# Patient Record
Sex: Male | Born: 1960 | Race: White | Hispanic: No | Marital: Married | Smoking: Never smoker
Health system: Southern US, Community
[De-identification: ages and names within clinical notes are randomized; demographics above are authoritative.]

## PROBLEM LIST (undated history)

## (undated) DIAGNOSIS — G4730 Sleep apnea, unspecified: Secondary | ICD-10-CM

## (undated) DIAGNOSIS — I10 Essential (primary) hypertension: Secondary | ICD-10-CM

## (undated) DIAGNOSIS — M1990 Unspecified osteoarthritis, unspecified site: Secondary | ICD-10-CM

## (undated) DIAGNOSIS — J45909 Unspecified asthma, uncomplicated: Secondary | ICD-10-CM

## (undated) DIAGNOSIS — J302 Other seasonal allergic rhinitis: Secondary | ICD-10-CM

## (undated) DIAGNOSIS — E785 Hyperlipidemia, unspecified: Secondary | ICD-10-CM

## (undated) DIAGNOSIS — Z974 Presence of external hearing-aid: Secondary | ICD-10-CM

## (undated) DIAGNOSIS — E119 Type 2 diabetes mellitus without complications: Secondary | ICD-10-CM

## (undated) DIAGNOSIS — G589 Mononeuropathy, unspecified: Secondary | ICD-10-CM

## (undated) DIAGNOSIS — Z973 Presence of spectacles and contact lenses: Secondary | ICD-10-CM

## (undated) DIAGNOSIS — I8390 Asymptomatic varicose veins of unspecified lower extremity: Secondary | ICD-10-CM

## (undated) DIAGNOSIS — D649 Anemia, unspecified: Secondary | ICD-10-CM

## (undated) DIAGNOSIS — K219 Gastro-esophageal reflux disease without esophagitis: Secondary | ICD-10-CM

## (undated) DIAGNOSIS — G4733 Obstructive sleep apnea (adult) (pediatric): Secondary | ICD-10-CM

## (undated) DIAGNOSIS — K760 Fatty (change of) liver, not elsewhere classified: Secondary | ICD-10-CM

## (undated) DIAGNOSIS — Z87442 Personal history of urinary calculi: Secondary | ICD-10-CM

## (undated) DIAGNOSIS — R42 Dizziness and giddiness: Secondary | ICD-10-CM

## (undated) HISTORY — PX: TONSILLECTOMY AND ADENOIDECTOMY: SUR1326

## (undated) HISTORY — PX: COLONOSCOPY: SHX174

## (undated) HISTORY — DX: Hyperlipidemia, unspecified: E78.5

## (undated) HISTORY — DX: Essential (primary) hypertension: I10

## (undated) HISTORY — PX: KNEE ARTHROSCOPY: SUR90

## (undated) HISTORY — PX: NASAL SINUS SURGERY: SHX719

## (undated) HISTORY — PX: TRIGGER FINGER RELEASE: SHX641

## (undated) HISTORY — DX: Unspecified asthma, uncomplicated: J45.909

## (undated) HISTORY — PX: FRACTURE SURGERY: SHX138

## (undated) HISTORY — PX: SHOULDER ARTHROSCOPY: SHX128

## (undated) HISTORY — DX: Sleep apnea, unspecified: G47.30

## (undated) HISTORY — PX: CHOLECYSTECTOMY: SHX55

---

## 2013-02-17 ENCOUNTER — Other Ambulatory Visit: Payer: Self-pay | Admitting: *Deleted

## 2013-02-23 ENCOUNTER — Other Ambulatory Visit: Payer: Self-pay | Admitting: *Deleted

## 2013-02-23 ENCOUNTER — Other Ambulatory Visit: Payer: BC Managed Care – PPO

## 2013-02-23 ENCOUNTER — Other Ambulatory Visit: Payer: Self-pay

## 2013-02-23 DIAGNOSIS — IMO0001 Reserved for inherently not codable concepts without codable children: Secondary | ICD-10-CM

## 2013-02-23 LAB — LIPID PANEL
HDL: 38.1 mg/dL — ABNORMAL LOW (ref >39.00)
LDL Cholesterol: 54 mg/dL (ref 0–99)
Total CHOL/HDL Ratio: 3
Triglycerides: 167 mg/dL — ABNORMAL HIGH (ref 0.0–149.0)

## 2013-02-23 LAB — COMPREHENSIVE METABOLIC PANEL
ALT: 41 U/L (ref 0–53)
AST: 37 U/L (ref 0–37)
Albumin: 3.9 g/dL (ref 3.5–5.2)
Alkaline Phosphatase: 117 U/L (ref 39–117)
BUN: 13 mg/dL (ref 6–23)
Creatinine, Ser: 0.8 mg/dL (ref 0.4–1.5)
Potassium: 3.7 mEq/L (ref 3.5–5.1)

## 2013-02-23 LAB — HEMOGLOBIN A1C: Hgb A1c MFr Bld: 8.9 % — ABNORMAL HIGH (ref 4.6–6.5)

## 2013-02-23 LAB — URINALYSIS
Bilirubin Urine: NEGATIVE
Specific Gravity, Urine: >=1.03 (ref 1.000–1.030)
Total Protein, Urine: NEGATIVE
Urine Glucose: 500
pH: 5.5 (ref 5.0–8.0)

## 2013-03-03 ENCOUNTER — Encounter: Payer: Self-pay | Admitting: Endocrinology

## 2013-03-03 ENCOUNTER — Ambulatory Visit (INDEPENDENT_AMBULATORY_CARE_PROVIDER_SITE_OTHER): Payer: BC Managed Care – PPO | Admitting: Endocrinology

## 2013-03-03 VITALS — BP 118/62 | HR 88 | Temp 98.7°F | Resp 14 | Ht 73.0 in | Wt 333.9 lb

## 2013-03-03 DIAGNOSIS — E785 Hyperlipidemia, unspecified: Secondary | ICD-10-CM

## 2013-03-03 DIAGNOSIS — E669 Obesity, unspecified: Secondary | ICD-10-CM

## 2013-03-03 DIAGNOSIS — E119 Type 2 diabetes mellitus without complications: Secondary | ICD-10-CM | POA: Insufficient documentation

## 2013-03-03 DIAGNOSIS — I10 Essential (primary) hypertension: Secondary | ICD-10-CM | POA: Insufficient documentation

## 2013-03-03 NOTE — Progress Notes (Signed)
Patient ID: Darryl Hunt, male   DOB: [birth year], 52 y.o.   MRN: [id number]  Darryl Hunt is an 52 y.o. male.   Reason for Appointment: Diabetes follow-up   History of Present Illness   Diagnosis: Type 2 DIABETES MELITUS, date of diagnosis: 2010          His diabetes is usually under  Fairly good control with regimen of Victoza, metformin and  WelChol  However because of having problems with the shoulder and needing surgery he has completely  ignoredhis diabetes and blood sugars are poorly controlled   He has not been watching his diet and has been irregular with some medications Has not monitored his blood sugar  For nearly 3 months    Side effects from medications: None Monitors blood glucose: Once a day.    Glucometer: One Touch.          Blood Glucose readings  Not available from home , lab glucose 167    Hypoglycemia frequency: Never.          Meals: 3 meals per day.  watching diet poorly     Physical activity: exercise:  None recently            continues to work right shift  Wt Readings from Last 3 Encounters:  03/03/13 333 lb 14.4 oz (151.456 kg)    No visits with results within 1 Week(s) from this visit. Latest known visit with results is:  Appointment on 02/23/2013  Component Date Value Range Status  . Microalb, Ur 02/23/2013 4.3* 0.0 - 1.9 mg/dL Final  . Creatinine,U 82/95/6213 204.0   Final  . Microalb Creat Ratio 02/23/2013 2.1  0.0 - 30.0 mg/g Final  . Color, Urine 02/23/2013 LT. YELLOW  Yellow;Lt. Yellow Final  . APPearance 02/23/2013 CLEAR  Clear Final  . Specific Gravity, Urine 02/23/2013 >=1.030  1.000 - 1.030 Final  . pH 02/23/2013 5.5  5.0 - 8.0 Final  . Total Protein, Urine 02/23/2013 NEGATIVE  Negative Final  . Urine Glucose 02/23/2013 500  Negative Final  . Ketones, ur 02/23/2013 TRACE  Negative Final  . Bilirubin Urine 02/23/2013 NEGATIVE  Negative Final  . Hgb urine dipstick 02/23/2013 NEGATIVE  Negative Final  . Urobilinogen, UA 02/23/2013  0.2  0.0 - 1.0 Final  . Leukocytes, UA 02/23/2013 NEGATIVE  Negative Final  . Nitrite 02/23/2013 NEGATIVE  Negative Final  . Hemoglobin A1C 02/23/2013 8.9* 4.6 - 6.5 % Final   Glycemic Control Guidelines for People with Diabetes:Non Diabetic:  <6%Goal of Therapy: <7%Additional Action Suggested:  >8%   . Sodium 02/23/2013 134* 135 - 145 mEq/L Final  . Potassium 02/23/2013 3.7  3.5 - 5.1 mEq/L Final  . Chloride 02/23/2013 100  96 - 112 mEq/L Final  . CO2 02/23/2013 24  19 - 32 mEq/L Final  . Glucose, Bld 02/23/2013 167* 70 - 99 mg/dL Final  . BUN 08/65/7846 13  6 - 23 mg/dL Final  . Creatinine, Ser 02/23/2013 0.8  0.4 - 1.5 mg/dL Final  . Total Bilirubin 02/23/2013 1.7* 0.3 - 1.2 mg/dL Final  . Alkaline Phosphatase 02/23/2013 117  39 - 117 U/L Final  . AST 02/23/2013 37  0 - 37 U/L Final  . ALT 02/23/2013 41  0 - 53 U/L Final  . Total Protein 02/23/2013 7.2  6.0 - 8.3 g/dL Final  . Albumin 96/29/5284 3.9  3.5 - 5.2 g/dL Final  . Calcium 13/24/4010 9.3  8.4 - 10.5 mg/dL Final  . GFR  02/23/2013 112.08  >60.00 mL/min Final  . Cholesterol 02/23/2013 125  0 - 200 mg/dL Final   ATP III Classification       Desirable:  < 200 mg/dL               Borderline High:  200 - 239 mg/dL          High:  > = 161 mg/dL  . Triglycerides 02/23/2013 167.0* 0.0 - 149.0 mg/dL Final   Normal:  <096 mg/dLBorderline High:  150 - 199 mg/dL  . HDL 02/23/2013 38.10* >39.00 mg/dL Final  . VLDL 04/54/0981 33.4  0.0 - 40.0 mg/dL Final  . LDL Cholesterol 02/23/2013 54  0 - 99 mg/dL Final  . Total CHOL/HDL Ratio 02/23/2013 3   Final                  Men          Women1/2 Average Risk     3.4          3.3Average Risk          5.0          4.42X Average Risk          9.6          7.13X Average Risk          15.0          11.0                          Medication List       This list is accurate as of: 03/03/13  4:51 PM.  Always use your most recent med list.               B-D ULTRAFINE III SHORT PEN 31G X 8 MM Misc   Generic drug:  Insulin Pen Needle     fish oil-omega-3 fatty acids 1000 MG capsule  Take 2 g by mouth daily. 2 tablets twice a day     lisinopril 20 MG tablet  Commonly known as:  PRINIVIL,ZESTRIL  20 mg.     loratadine 10 MG tablet  Commonly known as:  CLARITIN  Take 10 mg by mouth daily.     meloxicam 15 MG tablet  Commonly known as:  MOBIC  15 mg.     metFORMIN 1000 MG tablet  Commonly known as:  GLUCOPHAGE  Take 1,000 mg by mouth 2 (two) times daily.     methocarbamol 500 MG tablet  Commonly known as:  ROBAXIN     montelukast 10 MG tablet  Commonly known as:  SINGULAIR     omeprazole 20 MG capsule  Commonly known as:  PRILOSEC  Take 20 mg by mouth daily.     simvastatin 40 MG tablet  Commonly known as:  ZOCOR     VICTOZA 18 MG/3ML Sopn  Generic drug:  Liraglutide  1.8 mg.     WELCHOL 625 MG tablet  Generic drug:  colesevelam        Allergies: No Known Allergies  No past medical history on file.  No past surgical history on file.  No family history on file.  Social History:  reports that he has never smoked. He has never used smokeless tobacco. His alcohol and drug histories are not on file.  Review of Systems:  HYPERTENSION:   Is well-controlled with lisinopril  HYPERLIPIDEMIA: The lipid abnormality consists of elevated LDL which is now 54,  taking simvastatin  With WelChol.     Examination:   BP 118/62  Pulse 88  Temp(Src) 98.7 F (37.1 C)  Resp 14  Ht 6\' 1"  (1.854 m)  Wt 333 lb 14.4 oz (151.456 kg)  BMI 44.06 kg/m2  SpO2 97%  Body mass index is 44.06 kg/(m^2).   ASSESSMENT/ PLAN::   Diabetes type 2   The patient's diabetes control appears to be  Much worse because of noncompliance with diet, exercise and some medications  he also has difficulty losing weight.  Since his blood sugars have not been consistently optimally controlled will add Invokana. Discussed how this works, effects on glucose and blood pressure, dosage, side  effects  He will start with the sample and used 300 mg daily by prescription He will monitor his blood sugars again as well as discussed blood sugar targets, need for exercise, balanced low-fat meals and regular followup   He'll continue his Zocor  And WelChol  He'll reduce his lisinopril to half a tablet to avoid hypotension with Invokana since blood pressure is already low normal  Counseling time over 50% of today's 25 minute visit  Bryn Perkin 03/03/2013, 4:51 PM

## 2013-03-03 NOTE — Patient Instructions (Addendum)
Invokana 300 mg daily in ams  Lisinporil 1/2 tabs  Please check blood sugars at least half the time about 2 hours after any meal and as directed on waking up. Please bring blood sugar monitor to each visit

## 2013-03-08 DIAGNOSIS — E785 Hyperlipidemia, unspecified: Secondary | ICD-10-CM | POA: Insufficient documentation

## 2013-03-08 DIAGNOSIS — E669 Obesity, unspecified: Secondary | ICD-10-CM | POA: Insufficient documentation

## 2013-03-08 MED ORDER — CANAGLIFLOZIN 300 MG PO TABS: 300.0000 mg | ORAL_TABLET | Freq: Every day | ORAL | Status: DC

## 2013-03-10 ENCOUNTER — Other Ambulatory Visit: Payer: Self-pay | Admitting: *Deleted

## 2013-04-02 ENCOUNTER — Other Ambulatory Visit (INDEPENDENT_AMBULATORY_CARE_PROVIDER_SITE_OTHER): Payer: BC Managed Care – PPO | Admitting: *Deleted

## 2013-04-02 ENCOUNTER — Encounter: Payer: Self-pay | Admitting: Endocrinology

## 2013-04-02 ENCOUNTER — Ambulatory Visit (INDEPENDENT_AMBULATORY_CARE_PROVIDER_SITE_OTHER): Payer: BC Managed Care – PPO | Admitting: Endocrinology

## 2013-04-02 ENCOUNTER — Other Ambulatory Visit: Payer: Self-pay | Admitting: *Deleted

## 2013-04-02 VITALS — BP 114/60 | HR 76 | Temp 98.5°F | Resp 14 | Ht 73.0 in | Wt 325.0 lb

## 2013-04-02 DIAGNOSIS — Z23 Encounter for immunization: Secondary | ICD-10-CM

## 2013-04-02 DIAGNOSIS — I10 Essential (primary) hypertension: Secondary | ICD-10-CM

## 2013-04-02 MED ORDER — LISINOPRIL 10 MG PO TABS: 10.0000 mg | ORAL_TABLET | Freq: Every day | ORAL | Status: DC

## 2013-04-02 NOTE — Progress Notes (Signed)
Patient ID: Darryl Hunt, male   DOB: 1960-11-06, 52 y.o.   MRN: [id number]  Darryl Hunt is an 52 y.o. male.   Reason for Appointment: Diabetes follow-up   History of Present Illness   Diagnosis: Type 2 DIABETES MELITUS, date of diagnosis: 2010        PREVIOUS history: He has been treated previously with regimen of metformin, glipizide and subsequently Victoza Overall has had fairly good control and glipizide was stopped because of tendency to hypoglycemia His A1c in the past has ranged from 6.7-7%, was 7% in 4/14  RECENT history: He is now on a regimen of Victoza, metformin and  WelChol   The blood sugars were poorly controlled on his last visit and he was quite noncompliant with diet and some medications also Had also not been aware of his high sugars because he stopped monitoring He does work night shifts   Because of his difficulty losing weight even with Victoza he was given a trial of Invokana starting at 100 mg and then 300 mg per He has lost weight and recent blood sugars appear to be better He has had balanitis and frequent urination. Currently having control of his balanitis with OTC antifungal creams  Side effects from medications:  balanitis from Invokana Monitors blood glucose: Once a day.    Glucometer: One Touch.          Blood Glucose readings: Recently at work 117-149, evening 117-187 with sporadic readings. Highest reading 259 after supper about 2 weeks ago and overall median 143   Hypoglycemia frequency: Never.          Meals: 3 meals per day.  watching diet better recently  Physical activity: exercise: walking 30 minutes qod            Wt Readings from Last 3 Encounters:  04/02/13 325 lb (147.419 kg)  03/03/13 333 lb 14.4 oz (151.456 kg)    No visits with results within 1 Week(s) from this visit. Latest known visit with results is:  Appointment on 02/23/2013  Component Date Value Range Status  . Microalb, Ur 02/23/2013 4.3* 0.0 - 1.9 mg/dL Final   . Creatinine,U 02/23/2013 204.0   Final  . Microalb Creat Ratio 02/23/2013 2.1  0.0 - 30.0 mg/g Final  . Color, Urine 02/23/2013 LT. YELLOW  Yellow;Lt. Yellow Final  . APPearance 02/23/2013 CLEAR  Clear Final  . Specific Gravity, Urine 02/23/2013 >=1.030  1.000 - 1.030 Final  . pH 02/23/2013 5.5  5.0 - 8.0 Final  . Total Protein, Urine 02/23/2013 NEGATIVE  Negative Final  . Urine Glucose 02/23/2013 500  Negative Final  . Ketones, ur 02/23/2013 TRACE  Negative Final  . Bilirubin Urine 02/23/2013 NEGATIVE  Negative Final  . Hgb urine dipstick 02/23/2013 NEGATIVE  Negative Final  . Urobilinogen, UA 02/23/2013 0.2  0.0 - 1.0 Final  . Leukocytes, UA 02/23/2013 NEGATIVE  Negative Final  . Nitrite 02/23/2013 NEGATIVE  Negative Final  . Hemoglobin A1C 02/23/2013 8.9* 4.6 - 6.5 % Final   Glycemic Control Guidelines for People with Diabetes:Non Diabetic:  <6%Goal of Therapy: <7%Additional Action Suggested:  >8%   . Sodium 02/23/2013 134* 135 - 145 mEq/L Final  . Potassium 02/23/2013 3.7  3.5 - 5.1 mEq/L Final  . Chloride 02/23/2013 100  96 - 112 mEq/L Final  . CO2 02/23/2013 24  19 - 32 mEq/L Final  . Glucose, Bld 02/23/2013 167* 70 - 99 mg/dL Final  . BUN 40/98/1191 13  6 -  23 mg/dL Final  . Creatinine, Ser 02/23/2013 0.8  0.4 - 1.5 mg/dL Final  . Total Bilirubin 02/23/2013 1.7* 0.3 - 1.2 mg/dL Final  . Alkaline Phosphatase 02/23/2013 117  39 - 117 U/L Final  . AST 02/23/2013 37  0 - 37 U/L Final  . ALT 02/23/2013 41  0 - 53 U/L Final  . Total Protein 02/23/2013 7.2  6.0 - 8.3 g/dL Final  . Albumin 16/04/9603 3.9  3.5 - 5.2 g/dL Final  . Calcium 54/03/8118 9.3  8.4 - 10.5 mg/dL Final  . GFR 14/78/2956 112.08  >60.00 mL/min Final  . Cholesterol 02/23/2013 125  0 - 200 mg/dL Final   ATP III Classification       Desirable:  < 200 mg/dL               Borderline High:  200 - 239 mg/dL          High:  > = 213 mg/dL  . Triglycerides 02/23/2013 167.0* 0.0 - 149.0 mg/dL Final   Normal:  <086  mg/dLBorderline High:  150 - 199 mg/dL  . HDL 02/23/2013 38.10* >39.00 mg/dL Final  . VLDL 57/84/6962 33.4  0.0 - 40.0 mg/dL Final  . LDL Cholesterol 02/23/2013 54  0 - 99 mg/dL Final  . Total CHOL/HDL Ratio 02/23/2013 3   Final                  Men          Women1/2 Average Risk     3.4          3.3Average Risk          5.0          4.42X Average Risk          9.6          7.13X Average Risk          15.0          11.0                          Medication List       This list is accurate as of: 04/02/13 11:59 PM.  Always use your most recent med list.               B-D ULTRAFINE III SHORT PEN 31G X 8 MM Misc  Generic drug:  Insulin Pen Needle     Canagliflozin 300 MG Tabs  Commonly known as:  INVOKANA  Take 1 tablet (300 mg total) by mouth daily at 6 (six) AM.     fish oil-omega-3 fatty acids 1000 MG capsule  Take 2 g by mouth daily. 2 tablets twice a day     lisinopril 10 MG tablet  Commonly known as:  PRINIVIL,ZESTRIL  Take 1 tablet (10 mg total) by mouth daily.     loratadine 10 MG tablet  Commonly known as:  CLARITIN  Take 10 mg by mouth daily.     meloxicam 15 MG tablet  Commonly known as:  MOBIC  15 mg.     metFORMIN 1000 MG tablet  Commonly known as:  GLUCOPHAGE  Take 1,000 mg by mouth 2 (two) times daily.     methocarbamol 500 MG tablet  Commonly known as:  ROBAXIN     montelukast 10 MG tablet  Commonly known as:  SINGULAIR     omeprazole 20 MG capsule  Commonly known as:  PRILOSEC  Take 20 mg by mouth daily.     senna-docusate 8.6-50 MG per tablet  Commonly known as:  Senokot-S  Take 1 tablet by mouth daily.     simvastatin 40 MG tablet  Commonly known as:  ZOCOR     VICTOZA 18 MG/3ML Sopn  Generic drug:  Liraglutide  1.8 mg.     WELCHOL 625 MG tablet  Generic drug:  colesevelam        Allergies: No Known Allergies  Past Medical History  Diagnosis Date  . Sleep apnea   . Asthma   . Hypertension   . Hyperlipidemia     No past  surgical history on file.  Family History  Problem Relation Age of Onset  . Cancer Mother   . Cancer Father   . Hypertension Brother     Social History:  reports that he has never smoked. He has never used smokeless tobacco. His alcohol and drug histories are not on file.  Review of Systems:  HYPERTENSION:   Is well-controlled with lisinopril, did not reduce this to half a tablet as directed on the last visit  HYPERLIPIDEMIA: The lipid abnormality consists of elevated LDL which is now 54, taking simvastatin  With WelChol.     Examination:   BP 114/60  Pulse 76  Temp(Src) 98.5 F (36.9 C)  Resp 14  Ht 6\' 1"  (1.854 m)  Wt 325 lb (147.419 kg)  BMI 42.89 kg/m2  SpO2 97%  Body mass index is 42.89 kg/(m^2).   ASSESSMENT/ PLAN::   Diabetes type 2    Because he had difficulty losing weight with Victoza was continued and Invokana started on the last visit about a month ago  The patient's diabetes control appears to be  improving  and he has lost significant amount of weight He is taking this on waking up and is regular with this along with other medications He has also improved his compliance with diet, exercise and taking all his medications He did have balanitis and some frequent urination with starting Invokana and this is improved Encouraged him to check his blood sugars at various times  He will reduce his lisinopril to 10 mg a day since blood pressure is low normal  Will do A1c in 2 months  Coner Gibbard 04/03/2013, 2:00 PM

## 2013-04-02 NOTE — Patient Instructions (Addendum)
Please check blood sugars at least half the time about 2 hours after any meal and as directed on waking up. Please bring blood sugar monitor to each visit  Lisinopril 1/2 daily

## 2013-04-03 ENCOUNTER — Encounter: Payer: Self-pay | Admitting: Endocrinology

## 2013-04-21 ENCOUNTER — Telehealth: Payer: Self-pay | Admitting: Endocrinology

## 2013-04-21 NOTE — Telephone Encounter (Signed)
Pt is aware.  

## 2013-04-21 NOTE — Telephone Encounter (Signed)
Not from medication, likely to be related to the bones or discs in the spine

## 2013-04-21 NOTE — Telephone Encounter (Signed)
Pt says he is having a lot of pain in his back on his right side.  He says he's been on Invokana for about two months and symptoms started shortly after that.  He says he went to his NP who said she didn't think it was from the medicine, but could be arthritis, please advise.

## 2013-05-29 ENCOUNTER — Other Ambulatory Visit (INDEPENDENT_AMBULATORY_CARE_PROVIDER_SITE_OTHER): Payer: BC Managed Care – PPO

## 2013-05-29 LAB — COMPREHENSIVE METABOLIC PANEL
ALT: 40 U/L (ref 0–53)
Alkaline Phosphatase: 106 U/L (ref 39–117)
CO2: 24 mEq/L (ref 19–32)
Calcium: 9 mg/dL (ref 8.4–10.5)
Creatinine, Ser: 0.7 mg/dL (ref 0.4–1.5)
GFR: 129.24 mL/min (ref >60.00)
Glucose, Bld: 96 mg/dL (ref 70–99)
Potassium: 3.9 mEq/L (ref 3.5–5.1)
Sodium: 136 mEq/L (ref 135–145)
Total Bilirubin: 1.6 mg/dL — ABNORMAL HIGH (ref 0.3–1.2)
Total Protein: 7.3 g/dL (ref 6.0–8.3)

## 2013-05-29 LAB — HEMOGLOBIN A1C: Hgb A1c MFr Bld: 6.7 % — ABNORMAL HIGH (ref 4.6–6.5)

## 2013-05-29 LAB — FRUCTOSAMINE: Fructosamine: 212 umol/L (ref <285)

## 2013-06-02 ENCOUNTER — Ambulatory Visit (INDEPENDENT_AMBULATORY_CARE_PROVIDER_SITE_OTHER): Payer: BC Managed Care – PPO | Admitting: Endocrinology

## 2013-06-02 ENCOUNTER — Other Ambulatory Visit: Payer: Self-pay | Admitting: *Deleted

## 2013-06-02 ENCOUNTER — Encounter: Payer: Self-pay | Admitting: Endocrinology

## 2013-06-02 VITALS — BP 124/64 | HR 81 | Temp 98.6°F | Resp 12 | Ht 73.0 in | Wt 311.8 lb

## 2013-06-02 DIAGNOSIS — I10 Essential (primary) hypertension: Secondary | ICD-10-CM

## 2013-06-02 DIAGNOSIS — E785 Hyperlipidemia, unspecified: Secondary | ICD-10-CM

## 2013-06-02 MED ORDER — CANAGLIFLOZIN 300 MG PO TABS: 300.0000 mg | ORAL_TABLET | Freq: Every day | ORAL | Status: DC

## 2013-06-02 MED ORDER — COLESEVELAM HCL 625 MG PO TABS: 625.0000 mg | ORAL_TABLET | Freq: Every day | ORAL | Status: DC

## 2013-06-02 MED ORDER — GLUCOSE BLOOD VI STRP: ORAL_STRIP | Status: DC

## 2013-06-02 MED ORDER — FREESTYLE LITE DEVI: Status: DC

## 2013-06-02 NOTE — Progress Notes (Signed)
Patient ID: Darryl Hunt, male   DOB: 12-Sep-1960, 52 y.o.   MRN: [id number]  Darryl Hunt is an 52 y.o. male.   Reason for Appointment: Diabetes follow-up   History of Present Illness   Diagnosis: Type 2 DIABETES MELITUS, date of diagnosis: 2010        PREVIOUS history: He has been treated previously with regimen of metformin, glipizide and subsequently Victoza Overall has had fairly good control and glipizide was stopped because of tendency to hypoglycemia His A1c in the past has ranged from 6.7-7%, was 7% in 4/14  RECENT history: He is now on a regimen of Victoza, metformin, Invokana and  WelChol   The blood sugars were poorly controlled on his last visit and he was quite noncompliant with diet, glucose monitoring and some medications also He was started on Invokana in 8/14 and is taking 300 mg  He has continued to lose  weight and recent blood sugars appear to be significantly improved His A1c is at the best level he has had more recently He has been able to treat his balanitis with OTC creams and does not feel that this is a problem He does work night shifts   Side effects from medications:  mild balanitis from [facility] Monitors blood glucose: Once a day.    Glucometer: One Touch.          Blood Glucose readings: 85-134, readings checked before and after his lunch at work, only occasional readings at other times which are also good and overall median 1:15  Hypoglycemia: None        Meals: 3 meals per day.  watching diet better recently  Physical activity: exercise: walking 30+ minutes qod            Wt Readings from Last 3 Encounters:  06/02/13 311 lb 12.8 oz (141.432 kg)  04/02/13 325 lb (147.419 kg)  03/03/13 333 lb 14.4 oz (151.456 kg)   Lab Results  Component Value Date   HGBA1C 6.7* 05/29/2013   HGBA1C 8.9* 02/23/2013   Lab Results  Component Value Date   MICROALBUR 4.3* 02/23/2013   LDLCALC 54 02/23/2013   CREATININE 0.7 05/29/2013     Appointment on  05/29/2013  Component Date Value Range Status  . Sodium 05/29/2013 136  135 - 145 mEq/L Final  . Potassium 05/29/2013 3.9  3.5 - 5.1 mEq/L Final  . Chloride 05/29/2013 104  96 - 112 mEq/L Final  . CO2 05/29/2013 24  19 - 32 mEq/L Final  . Glucose, Bld 05/29/2013 96  70 - 99 mg/dL Final  . BUN 78/29/5621 9  6 - 23 mg/dL Final  . Creatinine, Ser 05/29/2013 0.7  0.4 - 1.5 mg/dL Final  . Total Bilirubin 05/29/2013 1.6* 0.3 - 1.2 mg/dL Final  . Alkaline Phosphatase 05/29/2013 106  39 - 117 U/L Final  . AST 05/29/2013 35  0 - 37 U/L Final  . ALT 05/29/2013 40  0 - 53 U/L Final  . Total Protein 05/29/2013 7.3  6.0 - 8.3 g/dL Final  . Albumin 30/86/5784 4.0  3.5 - 5.2 g/dL Final  . Calcium 69/62/9528 9.0  8.4 - 10.5 mg/dL Final  . GFR 41/32/4401 129.24  >60.00 mL/min Final  . Fructosamine 05/29/2013 212  <285 umol/L Final   Comment:                            Variations in levels of serum proteins (albumin  and immunoglobulins)                          may affect fructosamine results.                             . Hemoglobin A1C 05/29/2013 6.7* 4.6 - 6.5 % Final   Glycemic Control Guidelines for People with Diabetes:Non Diabetic:  <6%Goal of Therapy: <7%Additional Action Suggested:  >8%       Medication List       This list is accurate as of: 06/02/13 10:26 AM.  Always use your most recent med list.               B-D ULTRAFINE III SHORT PEN 31G X 8 MM Misc  Generic drug:  Insulin Pen Needle     Canagliflozin 300 MG Tabs  Commonly known as:  INVOKANA  Take 1 tablet (300 mg total) by mouth daily at 6 (six) AM.     colesevelam 625 MG tablet  Commonly known as:  WELCHOL  Take 1 tablet (625 mg total) by mouth daily with breakfast.     fish oil-omega-3 fatty acids 1000 MG capsule  Take 2 g by mouth daily. 2 tablets twice a day     ibuprofen 800 MG tablet  Commonly known as:  ADVIL,MOTRIN     lisinopril 10 MG tablet  Commonly known as:  PRINIVIL,ZESTRIL  Take 1 tablet (10 mg  total) by mouth daily.     loratadine 10 MG tablet  Commonly known as:  CLARITIN  Take 10 mg by mouth daily.     meloxicam 15 MG tablet  Commonly known as:  MOBIC  15 mg.     metFORMIN 1000 MG tablet  Commonly known as:  GLUCOPHAGE  Take 1,000 mg by mouth 2 (two) times daily.     methocarbamol 500 MG tablet  Commonly known as:  ROBAXIN     montelukast 10 MG tablet  Commonly known as:  SINGULAIR     omeprazole 20 MG capsule  Commonly known as:  PRILOSEC  Take 20 mg by mouth daily.     senna-docusate 8.6-50 MG per tablet  Commonly known as:  Senokot-S  Take 1 tablet by mouth daily.     simvastatin 40 MG tablet  Commonly known as:  ZOCOR     VICTOZA 18 MG/3ML Sopn  Generic drug:  Liraglutide  1.8 mg.        Allergies: No Known Allergies  Past Medical History  Diagnosis Date  . Sleep apnea   . Asthma   . Hypertension   . Hyperlipidemia     No past surgical history on file.  Family History  Problem Relation Age of Onset  . Cancer Mother   . Cancer Father   . Hypertension Brother     Social History:  reports that he has never smoked. He has never used smokeless tobacco. His alcohol and drug histories are not on file.  Review of Systems:  HYPERTENSION:   Is well-controlled with lisinopril, now on 10mg  only  HYPERLIPIDEMIA: The lipid abnormality consists of elevated LDL ,last 54, taking simvastatin  With WelChol.     Examination:   BP 124/64  Pulse 81  Temp(Src) 98.6 F (37 C)  Resp 12  Ht 6\' 1"  (1.854 m)  Wt 311 lb 12.8 oz (141.432 kg)  BMI 41.15 kg/m2  SpO2 96%  Body  mass index is 41.15 kg/(m^2).   ASSESSMENT/ PLAN::   Diabetes type 2   The patient's diabetes control appears to be excellent now and he has lost significant amount of weight with adding Invokana to his Victoza and metformin A1c is under 7% and his readings at home are practically normal when they are checked He is also fairly compliant with his exercise regimen and improving  his diet. He feels encouraged by the weight loss  Hypercholesterolemia: Since his LDL is relatively low with improved diet and weight loss can leave off WelChol and continue simvastatin alone. Does not need WelChol for his diabetes at this time  [facility] 06/02/2013, 10:26 AM

## 2013-06-02 NOTE — Patient Instructions (Signed)
Please check blood sugars at least half the time about 2 hours after any meal and as directed on waking up. Please bring blood sugar monitor to each visit  No change in meds   

## 2013-08-05 ENCOUNTER — Other Ambulatory Visit: Payer: Self-pay | Admitting: *Deleted

## 2013-08-05 MED ORDER — METFORMIN HCL 1000 MG PO TABS: 1000.0000 mg | ORAL_TABLET | Freq: Two times a day (BID) | ORAL | Status: DC

## 2013-08-14 ENCOUNTER — Other Ambulatory Visit: Payer: Self-pay | Admitting: *Deleted

## 2013-08-14 MED ORDER — LIRAGLUTIDE 18 MG/3ML ~~LOC~~ SOPN: 1.8000 mg | PEN_INJECTOR | Freq: Every day | SUBCUTANEOUS | Status: DC

## 2013-10-01 ENCOUNTER — Encounter: Payer: Self-pay | Admitting: Endocrinology

## 2013-10-01 ENCOUNTER — Ambulatory Visit (INDEPENDENT_AMBULATORY_CARE_PROVIDER_SITE_OTHER): Payer: BC Managed Care – PPO | Admitting: Endocrinology

## 2013-10-01 VITALS — BP 122/68 | HR 76 | Temp 97.9°F | Resp 16 | Ht 73.0 in | Wt 309.0 lb

## 2013-10-01 DIAGNOSIS — IMO0001 Reserved for inherently not codable concepts without codable children: Secondary | ICD-10-CM

## 2013-10-01 DIAGNOSIS — E785 Hyperlipidemia, unspecified: Secondary | ICD-10-CM

## 2013-10-01 DIAGNOSIS — E1165 Type 2 diabetes mellitus with hyperglycemia: Principal | ICD-10-CM

## 2013-10-01 LAB — LIPID PANEL
Cholesterol: 168 mg/dL (ref 0–200)
HDL: 48.4 mg/dL (ref >39.00)
LDL Cholesterol: 105 mg/dL — ABNORMAL HIGH (ref 0–99)
TRIGLYCERIDES: 74 mg/dL (ref 0.0–149.0)
Total CHOL/HDL Ratio: 3
VLDL: 14.8 mg/dL (ref 0.0–40.0)

## 2013-10-01 LAB — HEMOGLOBIN A1C: HEMOGLOBIN A1C: 6.6 % — AB (ref 4.6–6.5)

## 2013-10-01 LAB — BASIC METABOLIC PANEL
BUN: 10 mg/dL (ref 6–23)
CALCIUM: 9.1 mg/dL (ref 8.4–10.5)
CO2: 25 meq/L (ref 19–32)
Chloride: 104 mEq/L (ref 96–112)
Creatinine, Ser: 0.8 mg/dL (ref 0.4–1.5)
GFR: 105.48 mL/min (ref >60.00)
Glucose, Bld: 104 mg/dL — ABNORMAL HIGH (ref 70–99)
Potassium: 4 mEq/L (ref 3.5–5.1)
SODIUM: 137 meq/L (ref 135–145)

## 2013-10-01 NOTE — Progress Notes (Signed)
Patient ID: Darryl Hunt, male   DOB: Apr 16, 1961, 53 y.o.   MRN: [id number]    Reason for Appointment: Diabetes follow-up   History of Present Illness   Diagnosis: Type 2 DIABETES MELITUS, date of diagnosis: 2010        PREVIOUS history: He has been treated previously with regimen of metformin, glipizide and subsequently Victoza Overall has had fairly good control and glipizide was stopped because of tendency to hypoglycemia His A1c in the past has ranged from 6.7-7%, was 7% in 4/14  RECENT history: He is now on a regimen of Victoza, metformin, Invokana and  WelChol   The blood sugars were poorly controlled on his last visit and he was quite noncompliant with diet, glucose monitoring and some medications also He was started on Invokana in 8/14 and is taking 300 mg  He has lost  weight with this and recent blood sugars appear to be consistently improved He has been able to treat his balanitis with OTC creams and does not feel that this is a problem On his last visit A1c was below 7%, better than usual He does work night shifts   Side effects from medications:  mild balanitis from [facility] Monitors blood glucose: Once a day.    Glucometer: FreeStyle Blood Glucose readings:   PREMEAL  a.m.  Lunch Dinner Bedtime Overall  Glucose range:  94   88-114   95     Mean/median:      107    POST-MEAL PC Breakfast PC Lunch PC Dinner  Glucose range:   100-139   94-137   Mean/median:       Hypoglycemia: None        Meals: 3 meals per day.  1 am watching diet better recently  Physical activity: exercise: walking 30+ minutes qod on treadmill           Wt Readings from Last 3 Encounters:  10/01/13 309 lb (140.161 kg)  06/02/13 311 lb 12.8 oz (141.432 kg)  04/02/13 325 lb (147.419 kg)   Lab Results  Component Value Date   HGBA1C 6.7* 05/29/2013   HGBA1C 8.9* 02/23/2013   Lab Results  Component Value Date   MICROALBUR 4.3* 02/23/2013   LDLCALC 54 02/23/2013   CREATININE 0.7 05/29/2013      No visits with results within 1 Week(s) from this visit. Latest known visit with results is:  Appointment on 05/29/2013  Component Date Value Ref Range Status  . Sodium 05/29/2013 136  135 - 145 mEq/L Final  . Potassium 05/29/2013 3.9  3.5 - 5.1 mEq/L Final  . Chloride 05/29/2013 104  96 - 112 mEq/L Final  . CO2 05/29/2013 24  19 - 32 mEq/L Final  . Glucose, Bld 05/29/2013 96  70 - 99 mg/dL Final  . BUN 40/98/1191 9  6 - 23 mg/dL Final  . Creatinine, Ser 05/29/2013 0.7  0.4 - 1.5 mg/dL Final  . Total Bilirubin 05/29/2013 1.6* 0.3 - 1.2 mg/dL Final  . Alkaline Phosphatase 05/29/2013 106  39 - 117 U/L Final  . AST 05/29/2013 35  0 - 37 U/L Final  . ALT 05/29/2013 40  0 - 53 U/L Final  . Total Protein 05/29/2013 7.3  6.0 - 8.3 g/dL Final  . Albumin 47/82/9562 4.0  3.5 - 5.2 g/dL Final  . Calcium 13/02/6577 9.0  8.4 - 10.5 mg/dL Final  . GFR 46/96/2952 129.24  >60.00 mL/min Final  . Fructosamine 05/29/2013 212  <285 umol/L Final  Comment:                            Variations in levels of serum proteins (albumin and immunoglobulins)                          may affect fructosamine results.                             . Hemoglobin A1C 05/29/2013 6.7* 4.6 - 6.5 % Final   Glycemic Control Guidelines for People with Diabetes:Non Diabetic:  <6%Goal of Therapy: <7%Additional Action Suggested:  >8%       Medication List       This list is accurate as of: 10/01/13 10:32 AM.  Always use your most recent med list.               B-D ULTRAFINE III SHORT PEN 31G X 8 MM Misc  Generic drug:  Insulin Pen Needle     Canagliflozin 300 MG Tabs  Commonly known as:  INVOKANA  Take 1 tablet (300 mg total) by mouth daily at 6 (six) AM.     fish oil-omega-3 fatty acids 1000 MG capsule  Take 2 g by mouth daily. 2 tablets twice a day     FREESTYLE LITE Devi  Use to check blood sugars three times daily     glucose blood test strip  Commonly known as:  FREESTYLE LITE  Use as instructed  to check blood sugars 3 times a day     ibuprofen 800 MG tablet  Commonly known as:  ADVIL,MOTRIN  Take 800 mg by mouth 3 (three) times daily.     Liraglutide 18 MG/3ML Sopn  Commonly known as:  VICTOZA  Inject 1.8 mg into the skin daily.     lisinopril 10 MG tablet  Commonly known as:  PRINIVIL,ZESTRIL  Take 1 tablet (10 mg total) by mouth daily.     loratadine 10 MG tablet  Commonly known as:  CLARITIN  Take 10 mg by mouth daily.     meloxicam 15 MG tablet  Commonly known as:  MOBIC  15 mg.     metFORMIN 1000 MG tablet  Commonly known as:  GLUCOPHAGE  Take 1 tablet (1,000 mg total) by mouth 2 (two) times daily.     methocarbamol 500 MG tablet  Commonly known as:  ROBAXIN     montelukast 10 MG tablet  Commonly known as:  SINGULAIR     omeprazole 20 MG capsule  Commonly known as:  PRILOSEC  Take 20 mg by mouth daily.     senna-docusate 8.6-50 MG per tablet  Commonly known as:  Senokot-S  Take 1 tablet by mouth daily.     simvastatin 40 MG tablet  Commonly known as:  ZOCOR        Allergies: No Known Allergies  Past Medical History  Diagnosis Date  . Sleep apnea   . Asthma   . Hypertension   . Hyperlipidemia     No past surgical history on file.  Family History  Problem Relation Age of Onset  . Cancer Mother   . Cancer Father   . Hypertension Brother     Social History:  reports that he has never smoked. He has never used smokeless tobacco. His alcohol and drug histories are not on file.  Review of Systems:  HYPERTENSION:   Is well-controlled with lisinopril, now on 10mg  only. No orthostatic symptoms  HYPERLIPIDEMIA: The lipid abnormality consists of elevated LDL , last 54, taking simvastatin With WelChol.     Examination:   BP 122/68  Pulse 76  Temp(Src) 97.9 F (36.6 C)  Resp 16  Ht 6\' 1"  (1.854 m)  Wt 309 lb (140.161 kg)  BMI 40.78 kg/m2  SpO2 96%  Body mass index is 40.78 kg/(m^2).   ASSESSMENT/ PLAN::   Diabetes type 2   The  patient's diabetes control continues to be excellent and he has been able to maintain weight loss with adding Invokana to his Victoza and metformin His home blood sugars are mostly in the normal range even after meals A1c is to be checked today He is also fairly compliant with his exercise regimen and improving his diet.   Hypercholesterolemia: LDL to be checked today  Hypertension: Excellent control  Ewing Fandino 10/01/2013, 10:32 AM

## 2013-10-08 NOTE — Progress Notes (Signed)
Quick Note:  LDL high ______

## 2013-10-26 ENCOUNTER — Other Ambulatory Visit: Payer: Self-pay | Admitting: *Deleted

## 2013-10-26 MED ORDER — INSULIN PEN NEEDLE 31G X 8 MM MISC: Status: DC

## 2013-11-25 ENCOUNTER — Other Ambulatory Visit: Payer: Self-pay | Admitting: Endocrinology

## 2014-01-25 ENCOUNTER — Other Ambulatory Visit (INDEPENDENT_AMBULATORY_CARE_PROVIDER_SITE_OTHER): Payer: BC Managed Care – PPO

## 2014-01-25 DIAGNOSIS — IMO0001 Reserved for inherently not codable concepts without codable children: Secondary | ICD-10-CM

## 2014-01-25 DIAGNOSIS — E1165 Type 2 diabetes mellitus with hyperglycemia: Principal | ICD-10-CM

## 2014-01-25 LAB — HEMOGLOBIN A1C: Hgb A1c MFr Bld: 6.6 % — ABNORMAL HIGH (ref 4.6–6.5)

## 2014-01-26 ENCOUNTER — Other Ambulatory Visit: Payer: BC Managed Care – PPO

## 2014-01-26 LAB — MICROALBUMIN / CREATININE URINE RATIO
CREATININE, U: 79.7 mg/dL
Microalb Creat Ratio: 0.4 mg/g (ref 0.0–30.0)
Microalb, Ur: 0.3 mg/dL (ref 0.0–1.9)

## 2014-01-26 LAB — COMPREHENSIVE METABOLIC PANEL
ALBUMIN: 3.9 g/dL (ref 3.5–5.2)
ALK PHOS: 77 U/L (ref 39–117)
ALT: 33 U/L (ref 0–53)
AST: 31 U/L (ref 0–37)
BUN: 14 mg/dL (ref 6–23)
CO2: 24 mEq/L (ref 19–32)
Calcium: 9.4 mg/dL (ref 8.4–10.5)
Chloride: 108 mEq/L (ref 96–112)
Creatinine, Ser: 0.8 mg/dL (ref 0.4–1.5)
GFR: 101.02 mL/min (ref >60.00)
Glucose, Bld: 78 mg/dL (ref 70–99)
POTASSIUM: 3.9 meq/L (ref 3.5–5.1)
SODIUM: 139 meq/L (ref 135–145)
TOTAL PROTEIN: 6.7 g/dL (ref 6.0–8.3)
Total Bilirubin: 1.3 mg/dL — ABNORMAL HIGH (ref 0.2–1.2)

## 2014-01-28 ENCOUNTER — Ambulatory Visit (INDEPENDENT_AMBULATORY_CARE_PROVIDER_SITE_OTHER): Payer: BC Managed Care – PPO | Admitting: Endocrinology

## 2014-01-28 ENCOUNTER — Encounter: Payer: Self-pay | Admitting: Endocrinology

## 2014-01-28 VITALS — BP 133/72 | HR 83 | Temp 98.1°F | Resp 97 | Ht 73.0 in | Wt 314.4 lb

## 2014-01-28 DIAGNOSIS — I10 Essential (primary) hypertension: Secondary | ICD-10-CM

## 2014-01-28 DIAGNOSIS — E785 Hyperlipidemia, unspecified: Secondary | ICD-10-CM

## 2014-01-28 DIAGNOSIS — E1165 Type 2 diabetes mellitus with hyperglycemia: Principal | ICD-10-CM

## 2014-01-28 DIAGNOSIS — IMO0001 Reserved for inherently not codable concepts without codable children: Secondary | ICD-10-CM

## 2014-01-28 NOTE — Patient Instructions (Addendum)
Resume walking

## 2014-01-28 NOTE — Progress Notes (Signed)
Patient ID: Darryl Hunt, male   DOB: Jan 09, 1961, 53 y.o.   MRN: [id number]    Reason for Appointment: Diabetes follow-up   History of Present Illness   Diagnosis: Type 2 DIABETES MELITUS, date of diagnosis: 2010        PREVIOUS history: He has been treated previously with regimen of metformin, glipizide and subsequently Victoza Overall has had fairly good control and glipizide was stopped because of tendency to hypoglycemia His A1c in the past has ranged from 6.7-7%, was 7% in 4/14 He was started on Invokana in 8/14   RECENT history: He is  on a regimen of Victoza, metformin, Invokana 300 mg After starting Invokana he had started losing weight and blood sugars appear to be consistently improved Initially had some balanitis but this has resolved A1c has been consistently below 7% He does work night shifts and asked to check some readings after his lunch and also some after his evening meal at home  Side effects from medications:  none Monitors blood glucose: Once a day.    Glucometer: FreeStyle Blood Glucose readings:   Overnight 86-150  8 or 10 AM 106, 108  8-11 PM 88-145  Overall average 112  Hypoglycemia: None        Meals: 3 meals per day. Dinner 5-6 pm 1 am Not watching diet  as well recently with going on medications Physical activity: exercise: walking less   recently because of heat         Wt Readings from Last 3 Encounters:  01/28/14 314 lb 6.4 oz (142.611 kg)  10/01/13 309 lb (140.161 kg)  06/02/13 311 lb 12.8 oz (141.432 kg)   Lab Results  Component Value Date   HGBA1C 6.6* 01/25/2014   HGBA1C 6.6* 10/01/2013   HGBA1C 6.7* 05/29/2013   Lab Results  Component Value Date   MICROALBUR 0.3 01/25/2014   LDLCALC 105* 10/01/2013   CREATININE 0.8 01/25/2014     Appointment on 01/25/2014  Component Date Value Ref Range Status  . Hemoglobin A1C 01/25/2014 6.6* 4.6 - 6.5 % Final   Glycemic Control Guidelines for People with Diabetes:Non Diabetic:  <6%Goal of  Therapy: <7%Additional Action Suggested:  >8%   . Sodium 01/25/2014 139  135 - 145 mEq/L Final  . Potassium 01/25/2014 3.9  3.5 - 5.1 mEq/L Final  . Chloride 01/25/2014 108  96 - 112 mEq/L Final  . CO2 01/25/2014 24  19 - 32 mEq/L Final  . Glucose, Bld 01/25/2014 78  70 - 99 mg/dL Final  . BUN 78/29/5621 14  6 - 23 mg/dL Final  . Creatinine, Ser 01/25/2014 0.8  0.4 - 1.5 mg/dL Final  . Total Bilirubin 01/25/2014 1.3* 0.2 - 1.2 mg/dL Final  . Alkaline Phosphatase 01/25/2014 77  39 - 117 U/L Final  . AST 01/25/2014 31  0 - 37 U/L Final  . ALT 01/25/2014 33  0 - 53 U/L Final  . Total Protein 01/25/2014 6.7  6.0 - 8.3 g/dL Final  . Albumin 30/86/5784 3.9  3.5 - 5.2 g/dL Final  . Calcium 69/62/9528 9.4  8.4 - 10.5 mg/dL Final  . GFR 41/32/4401 101.02  >60.00 mL/min Final  . Microalb, Ur 01/25/2014 0.3  0.0 - 1.9 mg/dL Final  . Creatinine,U 02/72/5366 79.7   Final  . Microalb Creat Ratio 01/25/2014 0.4  0.0 - 30.0 mg/g Final      Medication List       This list is accurate as of: 01/28/14 10:03 AM.  Always use your most recent med list.               fish oil-omega-3 fatty acids 1000 MG capsule  Take 2 g by mouth daily. 2 tablets twice a day     FREESTYLE LITE Devi  Use to check blood sugars three times daily     glucose blood test strip  Commonly known as:  FREESTYLE LITE  Use as instructed to check blood sugars 3 times a day     ibuprofen 800 MG tablet  Commonly known as:  ADVIL,MOTRIN  Take 800 mg by mouth 2 (two) times daily.     Insulin Pen Needle 31G X 8 MM Misc  Commonly known as:  B-D ULTRAFINE III SHORT PEN  Use one pen needle per day with Victoza     INVOKANA 300 MG Tabs  Generic drug:  Canagliflozin  TAKE ONE TABLET BY MOUTH ONE TIME DAILY AT 6 AM     Liraglutide 18 MG/3ML Sopn  Commonly known as:  VICTOZA  Inject 1.8 mg into the skin daily.     lisinopril 10 MG tablet  Commonly known as:  PRINIVIL,ZESTRIL  Take 1 tablet (10 mg total) by mouth daily.      loratadine 10 MG tablet  Commonly known as:  CLARITIN  Take 10 mg by mouth daily.     meloxicam 15 MG tablet  Commonly known as:  MOBIC  15 mg.     metFORMIN 1000 MG tablet  Commonly known as:  GLUCOPHAGE  Take 1 tablet (1,000 mg total) by mouth 2 (two) times daily.     methocarbamol 500 MG tablet  Commonly known as:  ROBAXIN     montelukast 10 MG tablet  Commonly known as:  SINGULAIR     omeprazole 20 MG capsule  Commonly known as:  PRILOSEC  Take 20 mg by mouth daily.     senna-docusate 8.6-50 MG per tablet  Commonly known as:  Senokot-S  Take 1 tablet by mouth daily.     simvastatin 40 MG tablet  Commonly known as:  ZOCOR        Allergies: No Known Allergies  Past Medical History  Diagnosis Date  . Sleep apnea   . Asthma   . Hypertension   . Hyperlipidemia     No past surgical history on file.  Family History  Problem Relation Age of Onset  . Cancer Mother   . Cancer Father   . Hypertension Brother     Social History:  reports that he has never smoked. He has never used smokeless tobacco. His alcohol and drug histories are not on file.  Review of Systems:  HYPERTENSION:   Is well-controlled with lisinopril, now on 10mg  only. No orthostatic symptoms  HYPERLIPIDEMIA: The lipid abnormality consists of elevated LDL His WelChol was stopped as LDL had gone down to 54 and is only on simvastatin  Lab Results  Component Value Date   CHOL 168 10/01/2013   HDL 48.40 10/01/2013   LDLCALC 105* 10/01/2013   TRIG 74.0 10/01/2013   CHOLHDL 3 10/01/2013      He is going to check when he is due for eye exam    Examination:   BP 133/72  Pulse 83  Temp(Src) 98.1 F (36.7 C)  Resp 97  Ht 6\' 1"  (1.854 m)  Wt 314 lb 6.4 oz (142.611 kg)  BMI 41.49 kg/m2  SpO2 98%  Body mass index is 41.49 kg/(m^2).   Diabetic foot  exam shows normal monofilament sensation in the toes and plantar surfaces, no skin lesions or ulcers on the feet and normal pedal  pulses  ASSESSMENT/ PLAN:  Diabetes type 2   The patient's diabetes control continues to be good with excellent readings at home A1c is again less than 7% Has done overall better with adding Invokana to his Victoza and metformin His weight has however gone back up and he may not be consistent with his diet recently especially with eating out when traveling and not exercising as before  Hypercholesterolemia: LDL to be checked by his PCP on his physical next month  Hypertension: Excellent control  Calista Crain 01/28/2014, 10:03 AM

## 2014-01-29 ENCOUNTER — Ambulatory Visit: Payer: BC Managed Care – PPO | Admitting: Endocrinology

## 2014-03-08 ENCOUNTER — Other Ambulatory Visit: Payer: Self-pay | Admitting: *Deleted

## 2014-03-08 MED ORDER — METFORMIN HCL 1000 MG PO TABS: 1000.0000 mg | ORAL_TABLET | Freq: Two times a day (BID) | ORAL | Status: DC

## 2014-03-11 ENCOUNTER — Other Ambulatory Visit: Payer: Self-pay | Admitting: *Deleted

## 2014-03-11 MED ORDER — LISINOPRIL 10 MG PO TABS: 10.0000 mg | ORAL_TABLET | Freq: Every day | ORAL | Status: DC

## 2014-05-18 ENCOUNTER — Other Ambulatory Visit: Payer: Self-pay | Admitting: Endocrinology

## 2014-05-27 ENCOUNTER — Other Ambulatory Visit (INDEPENDENT_AMBULATORY_CARE_PROVIDER_SITE_OTHER): Payer: BC Managed Care – PPO

## 2014-05-27 ENCOUNTER — Other Ambulatory Visit: Payer: BC Managed Care – PPO

## 2014-05-27 DIAGNOSIS — E1165 Type 2 diabetes mellitus with hyperglycemia: Secondary | ICD-10-CM

## 2014-05-27 DIAGNOSIS — E785 Hyperlipidemia, unspecified: Secondary | ICD-10-CM

## 2014-05-27 DIAGNOSIS — IMO0002 Reserved for concepts with insufficient information to code with codable children: Secondary | ICD-10-CM

## 2014-05-27 LAB — URINALYSIS, ROUTINE W REFLEX MICROSCOPIC
HGB URINE DIPSTICK: NEGATIVE
Ketones, ur: NEGATIVE
Leukocytes, UA: NEGATIVE
NITRITE: NEGATIVE
RBC / HPF: NONE SEEN (ref 0–?)
Specific Gravity, Urine: 1.02 (ref 1.000–1.030)
Total Protein, Urine: NEGATIVE
UROBILINOGEN UA: 0.2 (ref 0.0–1.0)
Urine Glucose: >=1000 — AB
pH: 6 (ref 5.0–8.0)

## 2014-05-27 LAB — COMPREHENSIVE METABOLIC PANEL
ALT: 35 U/L (ref 0–53)
AST: 31 U/L (ref 0–37)
Albumin: 3.8 g/dL (ref 3.5–5.2)
Alkaline Phosphatase: 80 U/L (ref 39–117)
BILIRUBIN TOTAL: 1.7 mg/dL — AB (ref 0.2–1.2)
BUN: 12 mg/dL (ref 6–23)
CO2: 25 mEq/L (ref 19–32)
CREATININE: 0.7 mg/dL (ref 0.4–1.5)
Calcium: 8.8 mg/dL (ref 8.4–10.5)
Chloride: 105 mEq/L (ref 96–112)
GFR: 116.79 mL/min (ref >60.00)
Glucose, Bld: 108 mg/dL — ABNORMAL HIGH (ref 70–99)
POTASSIUM: 3.9 meq/L (ref 3.5–5.1)
Sodium: 136 mEq/L (ref 135–145)
Total Protein: 6.8 g/dL (ref 6.0–8.3)

## 2014-05-27 LAB — MICROALBUMIN / CREATININE URINE RATIO
CREATININE, U: 85.5 mg/dL
MICROALB UR: 0.3 mg/dL (ref 0.0–1.9)
MICROALB/CREAT RATIO: 0.4 mg/g (ref 0.0–30.0)

## 2014-05-27 LAB — HEMOGLOBIN A1C: Hgb A1c MFr Bld: 6.4 % (ref 4.6–6.5)

## 2014-05-27 LAB — LDL CHOLESTEROL, DIRECT: Direct LDL: 91.1 mg/dL

## 2014-06-01 ENCOUNTER — Other Ambulatory Visit: Payer: Self-pay | Admitting: *Deleted

## 2014-06-01 ENCOUNTER — Encounter: Payer: Self-pay | Admitting: Endocrinology

## 2014-06-01 ENCOUNTER — Ambulatory Visit (INDEPENDENT_AMBULATORY_CARE_PROVIDER_SITE_OTHER): Payer: BC Managed Care – PPO | Admitting: Endocrinology

## 2014-06-01 VITALS — BP 129/74 | HR 92 | Temp 98.2°F | Resp 16 | Ht 73.0 in | Wt 317.8 lb

## 2014-06-01 DIAGNOSIS — Z23 Encounter for immunization: Secondary | ICD-10-CM

## 2014-06-01 DIAGNOSIS — E785 Hyperlipidemia, unspecified: Secondary | ICD-10-CM

## 2014-06-01 DIAGNOSIS — I10 Essential (primary) hypertension: Secondary | ICD-10-CM

## 2014-06-01 DIAGNOSIS — E119 Type 2 diabetes mellitus without complications: Secondary | ICD-10-CM

## 2014-06-01 MED ORDER — GLUCOSE BLOOD VI STRP: ORAL_STRIP | Status: DC

## 2014-06-01 MED ORDER — FLUCONAZOLE 150 MG PO TABS: 150.0000 mg | ORAL_TABLET | Freq: Once | ORAL | Status: DC

## 2014-06-01 NOTE — Patient Instructions (Signed)
Please check blood sugars at least half the time about 2 hours after any meal and times per week on waking up. Please bring blood sugar monitor to each visit  May try 1/2 Invokana daily

## 2014-06-01 NOTE — Progress Notes (Signed)
ID: Darryl RobertsonAlan J Hunt, male   DOB: [birth year], 53 y.o.   MRN: [id number]    Reason for Appointment: Diabetes follow-up   History of Present Illness   Diagnosis: Type 2 DIABETES MELITUS, date of diagnosis: 2010        PREVIOUS history: He has been treated previously with regimen of metformin, glipizide and subsequently Victoza Overall has had fairly good control and glipizide was stopped because of tendency to hypoglycemia His A1c in the past has ranged from 6.7-7%, was 7% in 4/14 He was started on Invokana in 8/14   RECENT history: He is  on a regimen of Victoza, metformin, Invokana 300 mg After starting Invokana he had started losing weight and blood sugars appear to be consistently improved However his weight has gradually started going back up Initially had some balanitis and now this has been recurrent A1c has been consistently below 7% and is now in the normal range He does work night shifts and is checking his blood sugars only before and after his meal at work and not any other time despite previous discussion  Side effects from medications:  none Monitors blood glucose:  regularly up to twice a day     Glucometer: FreeStyle Blood Glucose readings:   PREMEAL  AC lunch   PCL  Dinner Bedtime Overall  Glucose range:  93-136   100-148      Mean/median:      109     Hypoglycemia: None        Meals: 3 meals per day. Dinner 5-6 pm 1 am Not watching diet  as well recently with going on medications Physical activity: exercise: walking more, upto 8 miles 2/7 days a week now in preparation for a hike next spring    Wt Readings from Last 3 Encounters:  06/01/14 317 lb 12.8 oz (144.153 kg)  01/28/14 314 lb 6.4 oz (142.611 kg)  10/01/13 309 lb (140.161 kg)   Lab Results  Component Value Date   HGBA1C 6.4 05/27/2014   HGBA1C 6.6* 01/25/2014   HGBA1C 6.6* 10/01/2013   Lab Results  Component Value Date   MICROALBUR 0.3 05/27/2014   LDLCALC 105* 10/01/2013   CREATININE 0.7  05/27/2014     Appointment on 05/27/2014  Component Date Value Ref Range Status  . Hgb A1c MFr Bld 05/27/2014 6.4  4.6 - 6.5 % Final   Glycemic Control Guidelines for People with Diabetes:Non Diabetic:  <6%Goal of Therapy: <7%Additional Action Suggested:  >8%   . Sodium 05/27/2014 136  135 - 145 mEq/L Final  . Potassium 05/27/2014 3.9  3.5 - 5.1 mEq/L Final  . Chloride 05/27/2014 105  96 - 112 mEq/L Final  . CO2 05/27/2014 25  19 - 32 mEq/L Final  . Glucose, Bld 05/27/2014 108* 70 - 99 mg/dL Final  . BUN 40/98/119111/19/2015 12  6 - 23 mg/dL Final  . Creatinine, Ser 05/27/2014 0.7  0.4 - 1.5 mg/dL Final  . Total Bilirubin 05/27/2014 1.7* 0.2 - 1.2 mg/dL Final  . Alkaline Phosphatase 05/27/2014 80  39 - 117 U/L Final  . AST 05/27/2014 31  0 - 37 U/L Final  . ALT 05/27/2014 35  0 - 53 U/L Final  . Total Protein 05/27/2014 6.8  6.0 - 8.3 g/dL Final  . Albumin 47/82/956211/19/2015 3.8  3.5 - 5.2 g/dL Final  . Calcium 13/08/657811/19/2015 8.8  8.4 - 10.5 mg/dL Final  . GFR 46/96/295211/19/2015 116.79  >60.00 mL/min Final  . Direct LDL 05/27/2014 91.1  Final   Optimal:  <100 mg/dLNear or Above Optimal:  100-129 mg/dLBorderline High:  130-159 mg/dLHigh:  160-189 mg/dLVery High:  >190 mg/dL  . Microalb, Ur 05/27/2014 0.3  0.0 - 1.9 mg/dL Final  . Creatinine,U 16/04/9603 85.5   Final  . Microalb Creat Ratio 05/27/2014 0.4  0.0 - 30.0 mg/g Final  . Color, Urine 05/27/2014 YELLOW  Yellow;Lt. Yellow Final  . APPearance 05/27/2014 CLEAR  Clear Final  . Specific Gravity, Urine 05/27/2014 1.020  1.000-1.030 Final  . pH 05/27/2014 6.0  5.0 - 8.0 Final  . Total Protein, Urine 05/27/2014 NEGATIVE  Negative Final  . Urine Glucose 05/27/2014 >=1000* Negative Final  . Ketones, ur 05/27/2014 NEGATIVE  Negative Final  . Bilirubin Urine 05/27/2014 MODERATE* Negative Final  . Hgb urine dipstick 05/27/2014 NEGATIVE  Negative Final  . Urobilinogen, UA 05/27/2014 0.2  0.0 - 1.0 Final  . Leukocytes, UA 05/27/2014 NEGATIVE  Negative Final  .  Nitrite 05/27/2014 NEGATIVE  Negative Final  . WBC, UA 05/27/2014 0-2/hpf  0-2/hpf Final  . RBC / HPF 05/27/2014 none seen  0-2/hpf Final  . Squamous Epithelial / LPF 05/27/2014 Rare(0-4/hpf)  Rare(0-4/hpf) Final      Medication List       This list is accurate as of: 06/01/14  9:01 AM.  Always use your most recent med list.               Co Q 10 100 MG Caps  Take 300 mg by mouth.     etodolac 300 MG capsule  Commonly known as:  LODINE  Take 300 mg by mouth every 8 (eight) hours.     fish oil-omega-3 fatty acids 1000 MG capsule  Take 2 g by mouth daily. 2 tablets twice a day     FREESTYLE LITE Devi  Use to check blood sugars three times daily     glucose blood test strip  Commonly known as:  FREESTYLE LITE  Use as instructed to check blood sugars 3 times a day     ibuprofen 800 MG tablet  Commonly known as:  ADVIL,MOTRIN  Take 800 mg by mouth 2 (two) times daily.     Insulin Pen Needle 31G X 8 MM Misc  Commonly known as:  B-D ULTRAFINE III SHORT PEN  Use one pen needle per day with Victoza     INVOKANA 300 MG Tabs tablet  Generic drug:  canagliflozin  TAKE ONE TABLET BY MOUTH ONE TIME DAILY AT 6AM     Liraglutide 18 MG/3ML Sopn  Commonly known as:  VICTOZA  Inject 1.8 mg into the skin daily.     lisinopril 10 MG tablet  Commonly known as:  PRINIVIL,ZESTRIL  Take 1 tablet (10 mg total) by mouth daily.     loratadine 10 MG tablet  Commonly known as:  CLARITIN  Take 10 mg by mouth daily.     meloxicam 15 MG tablet  Commonly known as:  MOBIC  15 mg.     metFORMIN 1000 MG tablet  Commonly known as:  GLUCOPHAGE  Take 1 tablet (1,000 mg total) by mouth 2 (two) times daily.     methocarbamol 500 MG tablet  Commonly known as:  ROBAXIN     montelukast 10 MG tablet  Commonly known as:  SINGULAIR     omeprazole 20 MG capsule  Commonly known as:  PRILOSEC  Take 20 mg by mouth daily.     senna-docusate 8.6-50 MG per tablet  Commonly known as:  Senokot-S   Take 1 tablet by mouth daily.     simvastatin 40 MG tablet  Commonly known as:  ZOCOR        Allergies: No Known Allergies  Past Medical History  Diagnosis Date  . Sleep apnea   . Asthma   . Hypertension   . Hyperlipidemia     No past surgical history on file.  Family History  Problem Relation Age of Onset  . Cancer Mother   . Cancer Father   . Hypertension Brother     Social History:  reports that he has never smoked. He has never used smokeless tobacco. His alcohol and drug histories are not on file.  Review of Systems:  HYPERTENSION:   Is well-controlled with lisinopril, now on 10mg  only. No orthostatic symptoms  HYPERLIPIDEMIA: The lipid abnormality consists of elevated LDL His WelChol was stopped as LDL had gone down to 54 and is only on simvastatin with LDL still below 100  Lab Results  Component Value Date   CHOL 168 10/01/2013   HDL 48.40 10/01/2013   LDLCALC 105* 10/01/2013   LDLDIRECT 91.1 05/27/2014   TRIG 74.0 10/01/2013   CHOLHDL 3 10/01/2013     Diabetic foot exam in 7/15 shows normal monofilament sensation in the toes and plantar surfaces, no skin lesions or ulcers on the feet and normal pedal pulses  Takes Lodine bid for back pain, previously was on meloxicam    Examination:   BP 129/74 mmHg  Pulse 92  Temp(Src) 98.2 F (36.8 C)  Resp 16  Ht 6\' 1"  (1.854 m)  Wt 317 lb 12.8 oz (144.153 kg)  BMI 41.94 kg/m2  SpO2 95%  Body mass index is 41.94 kg/(m^2).    ASSESSMENT/ PLAN:  Diabetes type 2   The patient's diabetes control continues to be good with excellent readings at home although he is checking primarily before and after his lunch at work A1c is now less than 6.5 % Has done overall better with adding Invokana to his Victoza and metformin His weight has started going back up even though he is exercising more This may be more related to eating out and not being consistent on diet despite taking Victoza For now will continue on  the same regimen except reduce his Invokana to half a tablet because of symptoms of recurrent balanitis  Complications: None and is due for his eye exam soon  Hypercholesterolemia: LDL is still below 100 with simvastatin alone  Hypertension: Excellent control   Low back pain: Advised him to not take Lodine twice a day every single day and reduce it as much as possible to prevent long-term effects  Aamirah Salmi 06/01/2014, 9:01 AM

## 2014-07-06 ENCOUNTER — Other Ambulatory Visit: Payer: Self-pay | Admitting: Endocrinology

## 2014-07-19 ENCOUNTER — Other Ambulatory Visit: Payer: Self-pay | Admitting: *Deleted

## 2014-07-19 ENCOUNTER — Telehealth: Payer: Self-pay | Admitting: Endocrinology

## 2014-07-19 MED ORDER — LIRAGLUTIDE 18 MG/3ML ~~LOC~~ SOPN: 1.8000 mg | PEN_INJECTOR | Freq: Every day | SUBCUTANEOUS | Status: DC

## 2014-07-19 NOTE — Telephone Encounter (Signed)
Pt needs refill on victoza called into [facility]

## 2014-07-19 NOTE — Telephone Encounter (Signed)
rx sent

## 2014-09-24 ENCOUNTER — Other Ambulatory Visit (INDEPENDENT_AMBULATORY_CARE_PROVIDER_SITE_OTHER): Payer: BLUE CROSS/BLUE SHIELD

## 2014-09-24 DIAGNOSIS — E119 Type 2 diabetes mellitus without complications: Secondary | ICD-10-CM | POA: Diagnosis not present

## 2014-09-24 LAB — HEMOGLOBIN A1C: Hgb A1c MFr Bld: 6.6 % — ABNORMAL HIGH (ref 4.6–6.5)

## 2014-09-27 ENCOUNTER — Other Ambulatory Visit: Payer: BC Managed Care – PPO

## 2014-09-27 LAB — BASIC METABOLIC PANEL
BUN: 12 mg/dL (ref 6–23)
CO2: 24 mEq/L (ref 19–32)
CREATININE: 0.76 mg/dL (ref 0.40–1.50)
Calcium: 9.3 mg/dL (ref 8.4–10.5)
Chloride: 105 mEq/L (ref 96–112)
GFR: 113.11 mL/min (ref >60.00)
Glucose, Bld: 88 mg/dL (ref 70–99)
Potassium: 3.9 mEq/L (ref 3.5–5.1)
Sodium: 139 mEq/L (ref 135–145)

## 2014-09-30 ENCOUNTER — Other Ambulatory Visit: Payer: Self-pay | Admitting: *Deleted

## 2014-09-30 ENCOUNTER — Ambulatory Visit (INDEPENDENT_AMBULATORY_CARE_PROVIDER_SITE_OTHER): Payer: Self-pay | Admitting: Endocrinology

## 2014-09-30 ENCOUNTER — Encounter: Payer: Self-pay | Admitting: Endocrinology

## 2014-09-30 VITALS — BP 118/65 | HR 80 | Temp 98.4°F | Resp 16 | Ht 73.0 in | Wt 318.0 lb

## 2014-09-30 DIAGNOSIS — G4733 Obstructive sleep apnea (adult) (pediatric): Secondary | ICD-10-CM | POA: Diagnosis not present

## 2014-09-30 DIAGNOSIS — E119 Type 2 diabetes mellitus without complications: Secondary | ICD-10-CM | POA: Diagnosis not present

## 2014-09-30 DIAGNOSIS — E785 Hyperlipidemia, unspecified: Secondary | ICD-10-CM | POA: Diagnosis not present

## 2014-09-30 MED ORDER — LISINOPRIL 10 MG PO TABS: 10.0000 mg | ORAL_TABLET | Freq: Every day | ORAL | Status: DC

## 2014-09-30 MED ORDER — METFORMIN HCL 1000 MG PO TABS: 1000.0000 mg | ORAL_TABLET | Freq: Two times a day (BID) | ORAL | Status: DC

## 2014-09-30 NOTE — Progress Notes (Signed)
Patient ID: Darryl Hunt, male   DOB: [birth year], 54 y.o.   MRN: [id number]    Reason for Appointment: Diabetes follow-up   History of Present Illness   Diagnosis: Type 2 DIABETES MELITUS, date of diagnosis: 2010        PREVIOUS history: He has been treated previously with regimen of metformin, glipizide and subsequently Victoza Overall has had fairly good control and glipizide was stopped because of tendency to hypoglycemia His A1c in the past has ranged from 6.7-7%, was 7% in 4/14 He was started on Invokana in 8/14   RECENT history:  He is  on a regimen of Victoza, metformin, Invokana 300 mg After starting Invokana he had started losing weight and blood sugars appear to be consistently improved Because of recurrent balanitis he was told to take half a tablet daily of the Invokana Not complaining about any side effects today  A1c has been consistently below 7% and is now fairly stable More recently has started working day shift and he feels that he is resting more. However unable to lose any more weight Did not bring his monitor for download today but he claims his blood sugars are all in the normal range  Side effects from medications:  none. Monitors blood glucose: Unknown frequency     Glucometer: FreeStyle Blood Glucose readings: 90-120 even after meals by recall  Hypoglycemia: None        Meals: 3 meals per day  Not watching diet  as well recently with going on medications Physical activity: exercise: walking, upto 4 miles 2-3/7 days a week now in preparation for a long hike    Wt Readings from Last 3 Encounters:  09/30/14 318 lb (144.244 kg)  06/01/14 317 lb 12.8 oz (144.153 kg)  01/28/14 314 lb 6.4 oz (142.611 kg)   Lab Results  Component Value Date   HGBA1C 6.6* 09/24/2014   HGBA1C 6.4 05/27/2014   HGBA1C 6.6* 01/25/2014   Lab Results  Component Value Date   MICROALBUR 0.3 05/27/2014   LDLCALC 105* 10/01/2013   CREATININE 0.76 09/24/2014      Appointment on 09/24/2014  Component Date Value Ref Range Status  . Hgb A1c MFr Bld 09/24/2014 6.6* 4.6 - 6.5 % Final   Glycemic Control Guidelines for People with Diabetes:Non Diabetic:  <6%Goal of Therapy: <7%Additional Action Suggested:  >8%   . Sodium 09/24/2014 139  135 - 145 mEq/L Final  . Potassium 09/24/2014 3.9  3.5 - 5.1 mEq/L Final  . Chloride 09/24/2014 105  96 - 112 mEq/L Final  . CO2 09/24/2014 24  19 - 32 mEq/L Final  . Glucose, Bld 09/24/2014 88  70 - 99 mg/dL Final  . BUN 40/98/1191 12  6 - 23 mg/dL Final  . Creatinine, Ser 09/24/2014 0.76  0.40 - 1.50 mg/dL Final  . Calcium 47/82/9562 9.3  8.4 - 10.5 mg/dL Final  . GFR 13/02/6577 113.11  >60.00 mL/min Final      Medication List       This list is accurate as of: 09/30/14 10:08 AM.  Always use your most recent med list.               Co Q 10 100 MG Caps  Take 300 mg by mouth.     etodolac 300 MG capsule  Commonly known as:  LODINE  Take 300 mg by mouth every 8 (eight) hours.     fish oil-omega-3 fatty acids 1000 MG capsule  Take 2 g by  mouth daily. 2 tablets twice a day     FREESTYLE LITE Devi  Use to check blood sugars three times daily     glucose blood test strip  Commonly known as:  FREESTYLE LITE  Use as instructed to check blood sugars 3 times a day dx code E11.65     Insulin Pen Needle 31G X 8 MM Misc  Commonly known as:  B-D ULTRAFINE III SHORT PEN  Use one pen needle per day with Victoza     INVOKANA 300 MG Tabs tablet  Generic drug:  canagliflozin  TAKE 1 TABLET BY MOUTH ONCE A DAY AT 6 AM     Liraglutide 18 MG/3ML Sopn  Commonly known as:  VICTOZA  Inject 1.8 mg into the skin daily.     lisinopril 10 MG tablet  Commonly known as:  PRINIVIL,ZESTRIL  Take 1 tablet (10 mg total) by mouth daily.     loratadine 10 MG tablet  Commonly known as:  CLARITIN  Take 10 mg by mouth daily.     metFORMIN 1000 MG tablet  Commonly known as:  GLUCOPHAGE  Take 1 tablet (1,000 mg total)  by mouth 2 (two) times daily.     methocarbamol 500 MG tablet  Commonly known as:  ROBAXIN     montelukast 10 MG tablet  Commonly known as:  SINGULAIR     omeprazole 20 MG capsule  Commonly known as:  PRILOSEC  Take 20 mg by mouth daily.     simvastatin 40 MG tablet  Commonly known as:  ZOCOR        Allergies: No Known Allergies  Past Medical History  Diagnosis Date  . Sleep apnea   . Asthma   . Hypertension   . Hyperlipidemia     No past surgical history on file.  Family History  Problem Relation Age of Onset  . Cancer Mother   . Cancer Father   . Hypertension Brother     Social History:  reports that he has never smoked. He has never used smokeless tobacco. His alcohol and drug histories are not on file.  Review of Systems:  HYPERTENSION:   Is well-controlled with lisinopril, now on 10mg .   HYPERLIPIDEMIA: The lipid abnormality consists of elevated LDL His WelChol was stopped as LDL had gone down to 54 and is only on simvastatin with LDL still below 100  Lab Results  Component Value Date   CHOL 168 10/01/2013   HDL 48.40 10/01/2013   LDLCALC 105* 10/01/2013   LDLDIRECT 91.1 05/27/2014   TRIG 74.0 10/01/2013   CHOLHDL 3 10/01/2013     Diabetic foot exam in 7/15 shows normal monofilament sensation in the toes and plantar surfaces, no skin lesions or ulcers on the feet and normal pedal pulses  Takes Lodine  for back pain, previously was on meloxicam  He is on treatment for sleep apnea    Examination:   BP 146/75 mmHg  Pulse 80  Temp(Src) 98.4 F (36.9 C)  Resp 16  Ht 6\' 1"  (1.854 m)  Wt 318 lb (144.244 kg)  BMI 41.96 kg/m2  SpO2 97%  Body mass index is 41.96 kg/(m^2).   Repeat blood pressure 118/65  No pedal edema present  ASSESSMENT/ PLAN:  Diabetes type 2   The patient's diabetes control continues to be good with his multiple drug regimen He has excellent readings at home although he did not bring his monitor for download and not  clear how often or when he  is checking readings A1c is stable in the upper normal range Has done overall better with adding Invokana to his Victoza and metformin He has tried to exercise some and his weight has leveled off  For now will continue on the same regimen except reduce his Invokana to half a tablet because of symptoms of recurrent balanitis  Complications: None   Hypercholesterolemia: LDL is still below 100 with simvastatin alone and will repeat on the next visit  Hypertension: Excellent control    Swara Donze 09/30/2014, 10:08 AM

## 2014-11-30 LAB — HM DIABETES EYE EXAM

## 2014-12-19 ENCOUNTER — Other Ambulatory Visit: Payer: Self-pay | Admitting: Endocrinology

## 2015-01-25 ENCOUNTER — Other Ambulatory Visit: Payer: BLUE CROSS/BLUE SHIELD

## 2015-01-28 ENCOUNTER — Ambulatory Visit: Payer: BLUE CROSS/BLUE SHIELD | Admitting: Endocrinology

## 2015-01-31 ENCOUNTER — Ambulatory Visit: Payer: BLUE CROSS/BLUE SHIELD | Admitting: Endocrinology

## 2015-02-02 ENCOUNTER — Other Ambulatory Visit (INDEPENDENT_AMBULATORY_CARE_PROVIDER_SITE_OTHER): Payer: BLUE CROSS/BLUE SHIELD

## 2015-02-02 DIAGNOSIS — E119 Type 2 diabetes mellitus without complications: Secondary | ICD-10-CM | POA: Diagnosis not present

## 2015-02-02 DIAGNOSIS — E785 Hyperlipidemia, unspecified: Secondary | ICD-10-CM | POA: Diagnosis not present

## 2015-02-02 LAB — COMPREHENSIVE METABOLIC PANEL
ALK PHOS: 79 U/L (ref 39–117)
ALT: 30 U/L (ref 0–53)
AST: 28 U/L (ref 0–37)
Albumin: 4.3 g/dL (ref 3.5–5.2)
BUN: 15 mg/dL (ref 6–23)
CO2: 30 mEq/L (ref 19–32)
Calcium: 9.6 mg/dL (ref 8.4–10.5)
Chloride: 102 mEq/L (ref 96–112)
Creatinine, Ser: 0.79 mg/dL (ref 0.40–1.50)
GFR: 108.03 mL/min (ref >60.00)
GLUCOSE: 106 mg/dL — AB (ref 70–99)
POTASSIUM: 4.4 meq/L (ref 3.5–5.1)
Sodium: 139 mEq/L (ref 135–145)
Total Bilirubin: 1.5 mg/dL — ABNORMAL HIGH (ref 0.2–1.2)
Total Protein: 7.3 g/dL (ref 6.0–8.3)

## 2015-02-02 LAB — LIPID PANEL
CHOLESTEROL: 140 mg/dL (ref 0–200)
HDL: 41.8 mg/dL (ref >39.00)
LDL CALC: 79 mg/dL (ref 0–99)
NONHDL: 98.2
TRIGLYCERIDES: 95 mg/dL (ref 0.0–149.0)
Total CHOL/HDL Ratio: 3
VLDL: 19 mg/dL (ref 0.0–40.0)

## 2015-02-02 LAB — HEMOGLOBIN A1C: HEMOGLOBIN A1C: 6.5 % (ref 4.6–6.5)

## 2015-02-07 ENCOUNTER — Ambulatory Visit (INDEPENDENT_AMBULATORY_CARE_PROVIDER_SITE_OTHER): Payer: BLUE CROSS/BLUE SHIELD | Admitting: Endocrinology

## 2015-02-07 ENCOUNTER — Encounter: Payer: Self-pay | Admitting: Endocrinology

## 2015-02-07 VITALS — BP 130/80 | HR 80 | Wt 312.8 lb

## 2015-02-07 DIAGNOSIS — I10 Essential (primary) hypertension: Secondary | ICD-10-CM | POA: Diagnosis not present

## 2015-02-07 DIAGNOSIS — E119 Type 2 diabetes mellitus without complications: Secondary | ICD-10-CM | POA: Diagnosis not present

## 2015-02-07 NOTE — Progress Notes (Signed)
Patient ID: Darryl Hunt, male   DOB: November 04, 1960, 54 y.o.   MRN: [id number]    Reason for Appointment: Diabetes follow-up   History of Present Illness   Diagnosis: Type 2 DIABETES MELITUS, date of diagnosis: 2010        PREVIOUS history: He has been treated previously with regimen of metformin, glipizide and subsequently Victoza Overall has had fairly good control and glipizide was stopped because of tendency to hypoglycemia His A1c in the past has ranged from 6.7-7%, was 7% in 4/14 He was started on Invokana in 8/14   RECENT history:  He is currently on a regimen of Victoza 1.8 mg, metformin, Invokana 150 mg After starting Invokana he had started losing weight and blood sugars appear to be consistently improved Because of recurrent balanitis he was told to take half a tablet daily of the Invokana but he still is having recurrent balanitis despite taking periodic Diflucan and antifungal cream  A1c has been consistently below 7% and is now fairly stable around 6.5 He has been more active overall and although he thinks his diet can be more consistent he has lost weight since March  Side effects from medications:  none. Monitors blood glucose:  Once daily or less     Glucometer: FreeStyle Blood Glucose readings:113 average for the last 30 days, highest 137, checking mostly in the mornings  Hypoglycemia: None        Physical activity: exercise: walking, upto 3 miles 3/7 days     Wt Readings from Last 3 Encounters:  02/07/15 312 lb 12.8 oz (141.885 kg)  09/30/14 318 lb (144.244 kg)  06/01/14 317 lb 12.8 oz (144.153 kg)   Lab Results  Component Value Date   HGBA1C 6.5 02/02/2015   HGBA1C 6.6* 09/24/2014   HGBA1C 6.4 05/27/2014   Lab Results  Component Value Date   MICROALBUR 0.3 05/27/2014   LDLCALC 79 02/02/2015   CREATININE 0.79 02/02/2015     Lab on 02/02/2015  Component Date Value Ref Range Status  . Hgb A1c MFr Bld 02/02/2015 6.5  4.6 - 6.5 % Final   Glycemic Control Guidelines for People with Diabetes:Non Diabetic:  <6%Goal of Therapy: <7%Additional Action Suggested:  >8%   . Sodium 02/02/2015 139  135 - 145 mEq/L Final  . Potassium 02/02/2015 4.4  3.5 - 5.1 mEq/L Final  . Chloride 02/02/2015 102  96 - 112 mEq/L Final  . CO2 02/02/2015 30  19 - 32 mEq/L Final  . Glucose, Bld 02/02/2015 106* 70 - 99 mg/dL Final  . BUN 40/98/1191 15  6 - 23 mg/dL Final  . Creatinine, Ser 02/02/2015 0.79  0.40 - 1.50 mg/dL Final  . Total Bilirubin 02/02/2015 1.5* 0.2 - 1.2 mg/dL Final  . Alkaline Phosphatase 02/02/2015 79  39 - 117 U/L Final  . AST 02/02/2015 28  0 - 37 U/L Final  . ALT 02/02/2015 30  0 - 53 U/L Final  . Total Protein 02/02/2015 7.3  6.0 - 8.3 g/dL Final  . Albumin 47/82/9562 4.3  3.5 - 5.2 g/dL Final  . Calcium 13/02/6577 9.6  8.4 - 10.5 mg/dL Final  . GFR 46/96/2952 108.03  >60.00 mL/min Final  . Cholesterol 02/02/2015 140  0 - 200 mg/dL Final   ATP III Classification       Desirable:  < 200 mg/dL               Borderline High:  200 - 239 mg/dL  High:  > = 240 mg/dL  . Triglycerides 02/02/2015 95.0  0.0 - 149.0 mg/dL Final   Normal:  <119 mg/dLBorderline High:  150 - 199 mg/dL  . HDL 02/02/2015 41.80  >39.00 mg/dL Final  . VLDL 14/78/2956 19.0  0.0 - 40.0 mg/dL Final  . LDL Cholesterol 02/02/2015 79  0 - 99 mg/dL Final  . Total CHOL/HDL Ratio 02/02/2015 3   Final                  Men          Women1/2 Average Risk     3.4          3.3Average Risk          5.0          4.42X Average Risk          9.6          7.13X Average Risk          15.0          11.0                      . NonHDL 02/02/2015 98.20   Final   NOTE:  Non-HDL goal should be 30 mg/dL higher than patient's LDL goal (i.e. LDL goal of < 70 mg/dL, would have non-HDL goal of < 100 mg/dL)      Medication List       This list is accurate as of: 02/07/15 11:13 AM.  Always use your most recent med list.               B-D ULTRAFINE III SHORT PEN 31G X 8 MM Misc    Generic drug:  Insulin Pen Needle  USE ONE PEN NEEDLE TO INJECT VICTOZA ONCE DAILY     Co Q 10 100 MG Caps  Take 300 mg by mouth.     etodolac 300 MG capsule  Commonly known as:  LODINE  Take 300 mg by mouth every 8 (eight) hours.     fish oil-omega-3 fatty acids 1000 MG capsule  Take 2 g by mouth daily. 2 tablets twice a day     FREESTYLE LITE Devi  Use to check blood sugars three times daily     glucose blood test strip  Commonly known as:  FREESTYLE LITE  Use as instructed to check blood sugars 3 times a day dx code E11.65     INVOKANA 300 MG Tabs tablet  Generic drug:  canagliflozin  TAKE 1 TABLET BY MOUTH ONCE A DAY AT 6 AM     Liraglutide 18 MG/3ML Sopn  Commonly known as:  VICTOZA  Inject 1.8 mg into the skin daily.     lisinopril 10 MG tablet  Commonly known as:  PRINIVIL,ZESTRIL  Take 1 tablet (10 mg total) by mouth daily.     loratadine 10 MG tablet  Commonly known as:  CLARITIN  Take 10 mg by mouth daily.     metFORMIN 1000 MG tablet  Commonly known as:  GLUCOPHAGE  Take 1 tablet (1,000 mg total) by mouth 2 (two) times daily.     methocarbamol 500 MG tablet  Commonly known as:  ROBAXIN     montelukast 10 MG tablet  Commonly known as:  SINGULAIR     omeprazole 20 MG capsule  Commonly known as:  PRILOSEC  Take 20 mg by mouth daily.     simvastatin 40 MG tablet  Commonly  known as:  ZOCOR        Allergies: No Known Allergies  Past Medical History  Diagnosis Date  . Sleep apnea   . Asthma   . Hypertension   . Hyperlipidemia     History reviewed. No pertinent past surgical history.  Family History  Problem Relation Age of Onset  . Cancer Mother   . Hypertension Mother   . Cancer Father   . Hypertension Father   . Hypertension Brother   . Diabetes Brother   . Diabetes Maternal Grandmother     Social History:  reports that he has never smoked. He has never used smokeless tobacco. His alcohol and drug histories are not on  file.  Review of Systems:  HYPERTENSION:   Is well-controlled with lisinopril, now on . planning to get a home blood pressure monitor  HYPERLIPIDEMIA: The lipid abnormality consists of elevated LDL His WelChol was stopped as LDL had gone down to 54 and is only on simvastatin with LDL still below 100  Lab Results  Component Value Date   CHOL 140 02/02/2015   HDL 41.80 02/02/2015   LDLCALC 79 02/02/2015   LDLDIRECT 91.1 05/27/2014   TRIG 95.0 02/02/2015   CHOLHDL 3 02/02/2015     Diabetic foot exam in 7/16 shows normal monofilament sensation in the toes and plantar surfaces, no skin lesions or ulcers on the feet and normal pedal pulses  Takes Lodine qd for back pain  He is on treatment for sleep apnea    Examination:   BP 130/80 mmHg  Pulse 80  Wt 312 lb 12.8 oz (141.885 kg)  Body mass index is 41.28 kg/(m^2).   Diabetic foot exam shows normal monofilament sensation in the toes and plantar surfaces, no skin lesions or ulcers on the feet and normal pedal pulses   No pedal edema present  ASSESSMENT/ PLAN:  Diabetes type 2   The patient's diabetes control continues to be good with his multiple drug regimen He has excellent readings at home also A1c is stable in the upper normal range Has done overall better with adding Invokana to his Victoza and metformin but now he is complaining about consistent balanitis even with half a tablet Has been able to lose weight with regular exercise  For now will have him leave off the Invokana and see if his blood sugars are still well controlled with Victoza and metformin; consider adding low-dose Amaryl if needed  Hypercholesterolemia: LDL is still below 100 with simvastatin alone   Hypertension: Excellent control and he will watch this at home   [facility] 02/07/2015, 11:13 AM

## 2015-02-07 NOTE — Patient Instructions (Addendum)
Stop Invokana  Check blood sugars on waking up .. 3 .. times a week Also check blood sugars about 2 hours after a meal and do this after different meals by rotation  Recommended blood sugar levels on waking up is 90-120 and about 2 hours after meal is 120-150 Please bring blood sugar monitor to each visit.

## 2015-02-21 ENCOUNTER — Other Ambulatory Visit: Payer: Self-pay | Admitting: *Deleted

## 2015-02-21 MED ORDER — LIRAGLUTIDE 18 MG/3ML ~~LOC~~ SOPN: 1.8000 mg | PEN_INJECTOR | Freq: Every day | SUBCUTANEOUS | Status: DC

## 2015-02-22 ENCOUNTER — Telehealth: Payer: Self-pay | Admitting: Endocrinology

## 2015-02-22 NOTE — Telephone Encounter (Signed)
Patient called and would like to know if [facility] had sent a request for his medication   Rx: Victoza   Pharmacy: [facility]   Thank you

## 2015-02-22 NOTE — Telephone Encounter (Signed)
rx was sent on 08/15, message left on patients vm letting him know.

## 2015-03-02 ENCOUNTER — Other Ambulatory Visit: Payer: Self-pay | Admitting: Endocrinology

## 2015-03-25 ENCOUNTER — Other Ambulatory Visit: Payer: Self-pay | Admitting: *Deleted

## 2015-03-25 MED ORDER — LISINOPRIL 10 MG PO TABS: 10.0000 mg | ORAL_TABLET | Freq: Every day | ORAL | Status: DC

## 2015-03-25 MED ORDER — METFORMIN HCL 1000 MG PO TABS: 1000.0000 mg | ORAL_TABLET | Freq: Two times a day (BID) | ORAL | Status: DC

## 2015-04-04 ENCOUNTER — Telehealth: Payer: Self-pay | Admitting: Endocrinology

## 2015-04-18 ENCOUNTER — Other Ambulatory Visit: Payer: Self-pay | Admitting: *Deleted

## 2015-04-18 ENCOUNTER — Encounter: Payer: Self-pay | Admitting: Endocrinology

## 2015-04-18 ENCOUNTER — Ambulatory Visit (INDEPENDENT_AMBULATORY_CARE_PROVIDER_SITE_OTHER): Payer: BLUE CROSS/BLUE SHIELD | Admitting: Endocrinology

## 2015-04-18 VITALS — BP 128/82 | HR 73 | Temp 98.3°F | Resp 16 | Ht 73.0 in | Wt 320.4 lb

## 2015-04-18 DIAGNOSIS — E1100 Type 2 diabetes mellitus with hyperosmolarity without nonketotic hyperglycemic-hyperosmolar coma (NKHHC): Secondary | ICD-10-CM | POA: Diagnosis not present

## 2015-04-18 DIAGNOSIS — I10 Essential (primary) hypertension: Secondary | ICD-10-CM

## 2015-04-18 DIAGNOSIS — Z23 Encounter for immunization: Secondary | ICD-10-CM

## 2015-04-18 LAB — POCT GLYCOSYLATED HEMOGLOBIN (HGB A1C): Hemoglobin A1C: 6.8

## 2015-04-18 MED ORDER — LISINOPRIL 10 MG PO TABS: 10.0000 mg | ORAL_TABLET | Freq: Every day | ORAL | Status: DC

## 2015-04-18 NOTE — Progress Notes (Signed)
Patient ID: Darryl Hunt, male   DOB: [birth year], 54 y.o.   MRN: [id number]    Reason for Appointment: Diabetes follow-up   History of Present Illness   Diagnosis: Type 2 DIABETES MELITUS, date of diagnosis: 2010        PREVIOUS history: He has been treated previously with regimen of metformin, glipizide and subsequently Victoza Overall has had fairly good control and glipizide was stopped because of tendency to hypoglycemia His A1c in the past has ranged from 6.7-7%, was 7% in 4/14 He was started on Invokana in 8/14   RECENT history:  He is currently on a regimen of Victoza 1.8 mg and metformin1 g twice a day Previously with Invokana he had started losing weight and blood sugars appear to be consistently improved Because of recurrent balanitis he stopped this on his last visit in 8/16  A1c has been consistently below 7% and is now fairly stable around 6.8 However he has gained 8 pounds and partly this is from not exercising as before He is checking his blood sugars very infrequently and home and none after supper  Side effects from medications: balanitis from Invokana. Monitors blood glucose:  Once daily or less     Glucometer: FreeStyle Blood Glucose readings:  Mean values apply above for all meters except median for One Touch  PRE-MEAL Fasting Lunch Dinner Bedtime Overall  Glucose range: 119  78-121    Mean/median:     121   POST-MEAL PC Breakfast PC Lunch PC Dinner  Glucose range: 153, 157 141   Mean/median:      113 average for the last 30 days, highest 137, checking mostly in the mornings  Hypoglycemia: None        Physical activity: exercise: not walking, upto 3 miles 3/7 days     Wt Readings from Last 3 Encounters:  04/18/15 320 lb 6.4 oz (145.332 kg)  02/07/15 312 lb 12.8 oz (141.885 kg)  09/30/14 318 lb (144.244 kg)   Lab Results  Component Value Date   HGBA1C 6.8 04/18/2015   HGBA1C 6.5 02/02/2015   HGBA1C 6.6* 09/24/2014   Lab Results    Component Value Date   MICROALBUR 0.3 05/27/2014   LDLCALC 79 02/02/2015   CREATININE 0.79 02/02/2015     Office Visit on 04/18/2015  Component Date Value Ref Range Status  . Hemoglobin A1C 04/18/2015 6.8   Final      Medication List       This list is accurate as of: 04/18/15  4:15 PM.  Always use your most recent med list.               B-D ULTRAFINE III SHORT PEN 31G X 8 MM Misc  Generic drug:  Insulin Pen Needle  USE ONE PEN NEEDLE TO INJECT VICTOZA ONCE DAILY     Co Q 10 100 MG Caps  Take 300 mg by mouth.     etodolac 300 MG capsule  Commonly known as:  LODINE  Take 300 mg by mouth every 8 (eight) hours.     fish oil-omega-3 fatty acids 1000 MG capsule  Take 2 g by mouth daily. 2 tablets twice a day     fluconazole 150 MG tablet  Commonly known as:  DIFLUCAN  TAKE ONE TABLET BY MOUTH AS SINGLE DOSE     FREESTYLE LITE Devi  Use to check blood sugars three times daily     glucose blood test strip  Commonly known  as:  FREESTYLE LITE  Use as instructed to check blood sugars 3 times a day dx code E11.65     INVOKANA 300 MG Tabs tablet  Generic drug:  canagliflozin  TAKE 1 TABLET BY MOUTH ONCE A DAY AT 6 AM     Liraglutide 18 MG/3ML Sopn  Commonly known as:  VICTOZA  Inject 0.3 mLs (1.8 mg total) into the skin daily.     lisinopril 10 MG tablet  Commonly known as:  PRINIVIL,ZESTRIL  Take 1 tablet (10 mg total) by mouth daily.     loratadine 10 MG tablet  Commonly known as:  CLARITIN  Take 10 mg by mouth daily.     metFORMIN 1000 MG tablet  Commonly known as:  GLUCOPHAGE  Take 1 tablet (1,000 mg total) by mouth 2 (two) times daily.     methocarbamol 500 MG tablet  Commonly known as:  ROBAXIN     methocarbamol 750 MG tablet  Commonly known as:  ROBAXIN  Take 750 mg by mouth daily.     mometasone 50 MCG/ACT nasal spray  Commonly known as:  NASONEX  SPRAY TWO SPRAYS IN EACH NOSTRIL ONCE A DAY     montelukast 10 MG tablet  Commonly known as:   SINGULAIR     omeprazole 20 MG capsule  Commonly known as:  PRILOSEC  Take 20 mg by mouth daily.     simvastatin 40 MG tablet  Commonly known as:  ZOCOR        Allergies: No Known Allergies  Past Medical History  Diagnosis Date  . Sleep apnea   . Asthma   . Hypertension   . Hyperlipidemia     No past surgical history on file.  Family History  Problem Relation Age of Onset  . Cancer Mother   . Hypertension Mother   . Cancer Father   . Hypertension Father   . Hypertension Brother   . Diabetes Brother   . Diabetes Maternal Grandmother     Social History:  reports that he has never smoked. He has never used smokeless tobacco. His alcohol and drug histories are not [id number]: The lipid abnormality consists of elevated LDL His WelChol was stopped as LDL had gone down to 54 and is only on simvastatin with LDL still below 100  Lab Results  Component Value Date   CHOL 140 02/02/2015   HDL 41.80 02/02/2015   LDLCALC 79 02/02/2015   LDLDIRECT 91.1 05/27/2014   TRIG 95.0 02/02/2015   CHOLHDL 3 02/02/2015     Diabetic foot exam in 8/16 shows normal monofilament sensation in the toes and plantar surfaces, no skin lesions or ulcers on the feet and normal pedal pulses  He is on treatment for sleep apnea    Examination:   BP 128/82 mmHg  Pulse 73  Temp(Src) 98.3 F (36.8 C)  Resp 16  Ht  (1.854 m)  Wt 320 lb 6.4 oz (145.332 kg)  BMI 42.28 kg/m2  SpO2 96%  Body mass index is 42.28 kg/(m^2).    No pedal edema present  ASSESSMENT/ PLAN:  Diabetes type 2   The patient's diabetes control continues to be good with his multiple drug regimen although A1c slightly higher with stopping Invokana He has gained weight with stopping this.  Also has not been able to walk as much as  before  He has fairly good blood  sugars at home although checking infrequently and none after supper  For now will have him leave off the Invokana and see if his blood sugars are still well controlled with Victoza and metformin; consider adding low-dose Amaryl if needed  Hypertension: has continued good control and he will watch this at home   [facility] 04/18/2015, 4:15 PM

## 2015-04-18 NOTE — Patient Instructions (Signed)
Check blood sugars on waking up ..2  .. times a week Also check blood sugars about 2 hours after a meal and do this after different meals by rotation Recommended blood sugar levels on waking up is 90-130 and about 2 hours after meal is 140-180 Please bring blood sugar monitor to each visit.  Restart walking

## 2015-05-26 ENCOUNTER — Other Ambulatory Visit: Payer: Self-pay | Admitting: *Deleted

## 2015-05-26 MED ORDER — INSULIN PEN NEEDLE 30G X 8 MM MISC: 1.0000 | Status: DC | PRN

## 2015-07-14 ENCOUNTER — Other Ambulatory Visit: Payer: Self-pay | Admitting: *Deleted

## 2015-07-14 MED ORDER — ETODOLAC 300 MG PO CAPS: 300.0000 mg | ORAL_CAPSULE | Freq: Three times a day (TID) | ORAL | Status: DC

## 2015-07-20 ENCOUNTER — Ambulatory Visit: Payer: BLUE CROSS/BLUE SHIELD | Admitting: Endocrinology

## 2015-07-20 ENCOUNTER — Other Ambulatory Visit: Payer: BLUE CROSS/BLUE SHIELD

## 2015-07-25 ENCOUNTER — Other Ambulatory Visit: Payer: BLUE CROSS/BLUE SHIELD

## 2015-07-25 ENCOUNTER — Ambulatory Visit: Payer: BLUE CROSS/BLUE SHIELD | Admitting: Endocrinology

## 2015-07-29 ENCOUNTER — Other Ambulatory Visit: Payer: Self-pay | Admitting: Endocrinology

## 2015-09-05 ENCOUNTER — Other Ambulatory Visit: Payer: Self-pay | Admitting: Endocrinology

## 2015-09-26 ENCOUNTER — Other Ambulatory Visit (INDEPENDENT_AMBULATORY_CARE_PROVIDER_SITE_OTHER): Payer: BLUE CROSS/BLUE SHIELD

## 2015-09-26 ENCOUNTER — Other Ambulatory Visit: Payer: Self-pay | Admitting: Endocrinology

## 2015-09-26 ENCOUNTER — Other Ambulatory Visit: Payer: Self-pay | Admitting: *Deleted

## 2015-09-26 DIAGNOSIS — E118 Type 2 diabetes mellitus with unspecified complications: Secondary | ICD-10-CM | POA: Diagnosis not present

## 2015-09-26 DIAGNOSIS — E1100 Type 2 diabetes mellitus with hyperosmolarity without nonketotic hyperglycemic-hyperosmolar coma (NKHHC): Secondary | ICD-10-CM | POA: Diagnosis not present

## 2015-09-26 DIAGNOSIS — IMO0002 Reserved for concepts with insufficient information to code with codable children: Secondary | ICD-10-CM

## 2015-09-26 DIAGNOSIS — R1030 Lower abdominal pain, unspecified: Secondary | ICD-10-CM | POA: Diagnosis not present

## 2015-09-26 DIAGNOSIS — E1165 Type 2 diabetes mellitus with hyperglycemia: Secondary | ICD-10-CM

## 2015-09-26 DIAGNOSIS — IMO0001 Reserved for inherently not codable concepts without codable children: Secondary | ICD-10-CM

## 2015-09-26 LAB — COMPREHENSIVE METABOLIC PANEL
ALBUMIN: 4 g/dL (ref 3.5–5.2)
ALK PHOS: 88 U/L (ref 39–117)
ALT: 36 U/L (ref 0–53)
AST: 30 U/L (ref 0–37)
BUN: 10 mg/dL (ref 6–23)
CO2: 26 mEq/L (ref 19–32)
CREATININE: 0.71 mg/dL (ref 0.40–1.50)
Calcium: 9.1 mg/dL (ref 8.4–10.5)
Chloride: 103 mEq/L (ref 96–112)
GFR: 121.91 mL/min (ref >60.00)
GLUCOSE: 223 mg/dL — AB (ref 70–99)
POTASSIUM: 3.7 meq/L (ref 3.5–5.1)
SODIUM: 137 meq/L (ref 135–145)
TOTAL PROTEIN: 6.8 g/dL (ref 6.0–8.3)
Total Bilirubin: 1.5 mg/dL — ABNORMAL HIGH (ref 0.2–1.2)

## 2015-09-26 LAB — MICROALBUMIN / CREATININE URINE RATIO
Creatinine,U: 176.8 mg/dL
Microalb Creat Ratio: 1.5 mg/g (ref 0.0–30.0)
Microalb, Ur: 2.7 mg/dL — ABNORMAL HIGH (ref 0.0–1.9)

## 2015-09-26 LAB — CBC
HEMATOCRIT: 38.9 % — AB (ref 39.0–52.0)
HEMOGLOBIN: 13.1 g/dL (ref 13.0–17.0)
MCHC: 33.7 g/dL (ref 30.0–36.0)
MCV: 88.1 fl (ref 78.0–100.0)
Platelets: 93 10*3/uL — ABNORMAL LOW (ref 150.0–400.0)
RBC: 4.41 Mil/uL (ref 4.22–5.81)
RDW: 14.1 % (ref 11.5–15.5)
WBC: 5.1 10*3/uL (ref 4.0–10.5)

## 2015-09-26 LAB — HEMOGLOBIN A1C: HEMOGLOBIN A1C: 8 % — AB (ref 4.6–6.5)

## 2015-09-27 ENCOUNTER — Ambulatory Visit (INDEPENDENT_AMBULATORY_CARE_PROVIDER_SITE_OTHER): Payer: BLUE CROSS/BLUE SHIELD | Admitting: Endocrinology

## 2015-09-27 ENCOUNTER — Encounter: Payer: Self-pay | Admitting: Endocrinology

## 2015-09-27 ENCOUNTER — Other Ambulatory Visit (INDEPENDENT_AMBULATORY_CARE_PROVIDER_SITE_OTHER): Payer: BLUE CROSS/BLUE SHIELD

## 2015-09-27 VITALS — BP 136/76 | HR 79 | Temp 97.8°F | Resp 16 | Ht 73.0 in | Wt 330.2 lb

## 2015-09-27 DIAGNOSIS — R600 Localized edema: Secondary | ICD-10-CM | POA: Diagnosis not present

## 2015-09-27 DIAGNOSIS — E1165 Type 2 diabetes mellitus with hyperglycemia: Secondary | ICD-10-CM | POA: Diagnosis not present

## 2015-09-27 DIAGNOSIS — E785 Hyperlipidemia, unspecified: Secondary | ICD-10-CM | POA: Diagnosis not present

## 2015-09-27 LAB — LIPID PANEL
CHOLESTEROL: 138 mg/dL (ref 0–200)
HDL: 39.8 mg/dL (ref >39.00)
LDL Cholesterol: 69 mg/dL (ref 0–99)
NONHDL: 97.87
Total CHOL/HDL Ratio: 3
Triglycerides: 145 mg/dL (ref 0.0–149.0)
VLDL: 29 mg/dL (ref 0.0–40.0)

## 2015-09-27 MED ORDER — GLIMEPIRIDE 4 MG PO TABS: 4.0000 mg | ORAL_TABLET | Freq: Every day | ORAL | Status: DC

## 2015-09-27 NOTE — Patient Instructions (Addendum)
Check blood sugars on waking up  2-3  times a week Also check blood sugars about 2 hours after a meal and do this after different meals by rotation  Recommended blood sugar levels on waking up is 90-130 and about 2 hours after meal is 130-160  Please bring your blood sugar monitor to each visit, thank you  Watch carbs and portions  Check on Coverage for Saxenda  Glimepride 4mg  in am and when sugars <120 reduce to 1/2  Exercise  OTC Lamisil or lotrimin af

## 2015-09-27 NOTE — Progress Notes (Signed)
Patient ID: Darryl Hunt, male   DOB: [birth year], 55 y.o.   MRN: [id number]    Reason for Appointment: Diabetes follow-up   History of Present Illness   Diagnosis: Type 2 DIABETES MELITUS, date of diagnosis: 2010        PREVIOUS history: He has been treated previously with regimen of metformin, glipizide and subsequently Victoza Overall has had fairly good control and glipizide was stopped because of tendency to hypoglycemia His A1c in the past has ranged from 6.7-7%, was 7% in 4/14 He was started on Invokana in 8/14   RECENT history:  He is currently on a regimen of Victoza 1.8 mg and metformin 1 g twice a day Previously with Invokana he had started losing weight and blood sugars appear to be consistently improved Because of recurrent balanitis he stopped this on his visit in 8/16  A1c has been consistently below 7% previously but now has gone up to 8% He has gained a significant amount of weight and this is mostly from his inactivity subsequent to his ankle fracture in January. Also he has been inconsistent with diet with not controlling portions are carbohydrates especially with his not being very mobile and getting out of  He is checking his blood sugars very infrequently and home and has only 2 readings in the last month when his monitor  Side effects from medications: balanitis from Invokana. Monitors blood glucose:  Rarely     Glucometer: FreeStyle Blood Glucose readings: 196 recently  Physical activity: exercise: not walking, was doing upto 3 miles 3/7 days     Wt Readings from Last 3 Encounters:  09/27/15 330 lb 3.2 oz (149.778 kg)  04/18/15 320 lb 6.4 oz (145.332 kg)  02/07/15 312 lb 12.8 oz (141.885 kg)   Lab Results  Component Value Date   HGBA1C 8.0* 09/26/2015   HGBA1C 6.8 04/18/2015   HGBA1C 6.5 02/02/2015   Lab Results  Component Value Date   MICROALBUR 2.7* 09/26/2015   LDLCALC 69 09/27/2015   CREATININE 0.71 09/26/2015     Lab on  09/27/2015  Component Date Value Ref Range Status  . Cholesterol 09/27/2015 138  0 - 200 mg/dL Final   ATP III Classification       Desirable:  < 200 mg/dL               Borderline High:  200 - 239 mg/dL          High:  > = 725 mg/dL  . Triglycerides 09/27/2015 145.0  0.0 - 149.0 mg/dL Final   Normal:  <366 mg/dLBorderline High:  150 - 199 mg/dL  . HDL 09/27/2015 39.80  >39.00 mg/dL Final  . VLDL 44/09/4740 29.0  0.0 - 40.0 mg/dL Final  . LDL Cholesterol 09/27/2015 69  0 - 99 mg/dL Final  . Total CHOL/HDL Ratio 09/27/2015 3   Final                  Men          Women1/2 Average Risk     3.4          3.3Average Risk          5.0          4.42X Average Risk          9.6          7.13X Average Risk          15.0  11.0                      . NonHDL 09/27/2015 97.87   Final   NOTE:  Non-HDL goal should be 30 mg/dL higher than patient's LDL goal (i.e. LDL goal of < 70 mg/dL, would have non-HDL goal of < 100 mg/dL)  Lab on 16/04/9603  Component Date Value Ref Range Status  . Hgb A1c MFr Bld 09/26/2015 8.0* 4.6 - 6.5 % Final   Glycemic Control Guidelines for People with Diabetes:Non Diabetic:  <6%Goal of Therapy: <7%Additional Action Suggested:  >8%   . Sodium 09/26/2015 137  135 - 145 mEq/L Final  . Potassium 09/26/2015 3.7  3.5 - 5.1 mEq/L Final  . Chloride 09/26/2015 103  96 - 112 mEq/L Final  . CO2 09/26/2015 26  19 - 32 mEq/L Final  . Glucose, Bld 09/26/2015 223* 70 - 99 mg/dL Final  . BUN 54/03/8118 10  6 - 23 mg/dL Final  . Creatinine, Ser 09/26/2015 0.71  0.40 - 1.50 mg/dL Final  . Total Bilirubin 09/26/2015 1.5* 0.2 - 1.2 mg/dL Final  . Alkaline Phosphatase 09/26/2015 88  39 - 117 U/L Final  . AST 09/26/2015 30  0 - 37 U/L Final  . ALT 09/26/2015 36  0 - 53 U/L Final  . Total Protein 09/26/2015 6.8  6.0 - 8.3 g/dL Final  . Albumin 14/78/2956 4.0  3.5 - 5.2 g/dL Final  . Calcium 21/30/8657 9.1  8.4 - 10.5 mg/dL Final  . GFR 84/69/6295 121.91  >60.00 mL/min Final  . WBC  09/26/2015 5.1  4.0 - 10.5 K/uL Final  . RBC 09/26/2015 4.41  4.22 - 5.81 Mil/uL Final  . Platelets 09/26/2015 93.0* 150.0 - 400.0 K/uL Final  . Hemoglobin 09/26/2015 13.1  13.0 - 17.0 g/dL Final  . HCT 28/41/3244 38.9* 39.0 - 52.0 % Final  . MCV 09/26/2015 88.1  78.0 - 100.0 fl Final  . MCHC 09/26/2015 33.7  30.0 - 36.0 g/dL Final  . RDW 07/11/7251 14.1  11.5 - 15.5 % Final  . Microalb, Ur 09/26/2015 2.7* 0.0 - 1.9 mg/dL Final  . Creatinine,U 66/44/0347 176.8   Final  . Microalb Creat Ratio 09/26/2015 1.5  0.0 - 30.0 mg/g Final      Medication List       This list is accurate as of: 09/27/15  5:01 PM.  Always use your most recent med list.               Co Q 10 100 MG Caps  Take 300 mg by mouth. Reported on 09/27/2015     etodolac 300 MG capsule  Commonly known as:  LODINE  Take 1 capsule (300 mg total) by mouth every 8 (eight) hours.     fish oil-omega-3 fatty acids 1000 MG capsule  Take 2 g by mouth daily. 2 tablets twice a day     FREESTYLE LITE Devi  Use to check blood sugars three times daily     glimepiride 4 MG tablet  Commonly known as:  AMARYL  Take 1 tablet (4 mg total) by mouth daily before breakfast.     glucose blood test strip  Commonly known as:  FREESTYLE LITE  Use as instructed to check blood sugars 3 times a day dx code E11.65     Insulin Pen Needle 30G X 8 MM Misc  Commonly known as:  NOVOFINE  Inject 10 each into the skin as needed. Use one per  day to inject victoza     lisinopril 10 MG tablet  Commonly known as:  PRINIVIL,ZESTRIL  Take 1 tablet (10 mg total) by mouth daily.     loratadine 10 MG tablet  Commonly known as:  CLARITIN  Take 10 mg by mouth daily.     metFORMIN 1000 MG tablet  Commonly known as:  GLUCOPHAGE  Take 1 tablet by mouth two  times daily     methocarbamol 500 MG tablet  Commonly known as:  ROBAXIN  Reported on 09/27/2015     methocarbamol 750 MG tablet  Commonly known as:  ROBAXIN  Take 750 mg by mouth daily.  Reported on 09/27/2015     mometasone 50 MCG/ACT nasal spray  Commonly known as:  NASONEX  SPRAY TWO SPRAYS IN EACH NOSTRIL ONCE A DAY     montelukast 10 MG tablet  Commonly known as:  SINGULAIR     omeprazole 20 MG capsule  Commonly known as:  PRILOSEC  Take 20 mg by mouth daily.     simvastatin 40 MG tablet  Commonly known as:  ZOCOR     VICTOZA 18 MG/3ML Sopn  Generic drug:  Liraglutide  Inject subcutaneously 1.8mg  every day        Allergies: No Known Allergies  Past Medical History  Diagnosis Date  . Sleep apnea   . Asthma   . Hypertension   . Hyperlipidemia     No past surgical history on file.  Family History  Problem Relation Age of Onset  . Cancer Mother   . Hypertension Mother   . Cancer Father   . Hypertension Father   . Hypertension Brother   . Diabetes Brother   . Diabetes Maternal Grandmother     Social History:  reports that he has never smoked. He has never used smokeless tobacco. His alcohol and drug histories are not on file.  Review of Systems:  He is asking about persistent discoloration of his right lower legs is ankle fracture which had otherwise healed.  He thinks he was hospitalized for cellulitis subsequently  Also asking about itching and some redness in his groin areas, using only a powder now  HYPERTENSION:   Is well-controlled with lisinopril, now on 10mg   HYPERLIPIDEMIA: The lipid abnormality consists of elevated LDL His WelChol was stopped as LDL had gone down to 54 and is only on simvastatin    Lab Results  Component Value Date   CHOL 138 09/27/2015   HDL 39.80 09/27/2015   LDLCALC 69 09/27/2015   LDLDIRECT 91.1 05/27/2014   TRIG 145.0 09/27/2015   CHOLHDL 3 09/27/2015     Diabetic foot exam in 8/16 shows normal monofilament sensation in the toes and plantar surfaces, no skin lesions or ulcers on the feet and normal pedal pulses  He is on treatment for sleep apnea  He is complaining of occasional nausea and  tiredness.  He requested a CBC to be done which is normal    Examination:   BP 136/76 mmHg  Pulse 79  Temp(Src) 97.8 F (36.6 C)  Resp 16  Ht 6\' 1"  (1.854 m)  Wt 330 lb 3.2 oz (149.778 kg)  BMI 43.57 kg/m2  SpO2 96%  Body mass index is 43.57 kg/(m^2).    He has relatively swollen right lower leg with 2+ edema He has some stasis pigmentation on the anterior side also  ASSESSMENT/ PLAN:  Diabetes type 2 With morbid obesity  See history of present illness for detailed discussion  of current diabetes management, blood sugar patterns and problems identified His blood sugars are out of control, currently on Victoza 1.8 mg and metformin This is mostly related to his inactivity, weight gain and poor diet since his last visit, especially after his ankle fracture in January He is not getting control of his portions with Victoza now even with 1.8 mg He does need significant amount of weight loss   He does need to get back into his exercise regimen as tolerated and also start watching carbohydrates and portions and his diet  He may benefit from using  Saxenda instead of Victoza and he will check on the coverage for this, discussed the differences between Victoza Saxenda injection and Dosage titration  Meanwhile will give him a trial of Amaryl 4 mg in the morning to be reduced when blood sugars are better.  Discussed how this works and potential for hypoglycemia when blood sugar improves as well as dosage adjustment  He does need to check his blood sugars more consistently which he is not doing   Needs follow-up in 6 weeks to see if he is improving  May consider retrying low-dose Invokana but he is reluctant to do this now because of previous difficulties with balanitis   Intertrigo of groin area: He continues OTC antifungal creams  Hypertension: has continued good control   He will come back in 6 weeks for review of his diabetes control and advised him to bring his monitor for  download to help assess his home sugar patterns  Counseling time on subjects discussed above is over 50% of today's 25 minute visit   Shemika Robbs 09/27/2015, 5:01 PM

## 2015-11-09 ENCOUNTER — Ambulatory Visit: Payer: BLUE CROSS/BLUE SHIELD | Admitting: Endocrinology

## 2015-11-10 ENCOUNTER — Ambulatory Visit (INDEPENDENT_AMBULATORY_CARE_PROVIDER_SITE_OTHER): Payer: BLUE CROSS/BLUE SHIELD | Admitting: Endocrinology

## 2015-11-10 ENCOUNTER — Encounter: Payer: Self-pay | Admitting: Endocrinology

## 2015-11-10 VITALS — BP 144/82 | HR 76 | Temp 98.6°F | Wt 330.4 lb

## 2015-11-10 DIAGNOSIS — E1165 Type 2 diabetes mellitus with hyperglycemia: Secondary | ICD-10-CM | POA: Diagnosis not present

## 2015-11-10 MED ORDER — LIRAGLUTIDE -WEIGHT MANAGEMENT 18 MG/3ML ~~LOC~~ SOPN: 3.0000 mg | PEN_INJECTOR | Freq: Every day | SUBCUTANEOUS | Status: DC

## 2015-11-10 NOTE — Patient Instructions (Addendum)
Glimperide 1/2 twice daily  Check blood sugars on waking up  2-3 times a week Also check blood sugars about 2 hours after a meal and do this after different meals by rotation  Recommended blood sugar levels on waking up is 90-130 and about 2 hours after meal is 130-160  Please bring your blood sugar monitor to each visit, thank you  Victoza 1.8 plus 0.6, after 1 week 1.8 plus 1.2

## 2015-11-10 NOTE — Progress Notes (Signed)
Pre visit review using our clinic review tool, if applicable. No additional management support is needed unless otherwise documented below in the visit note. 

## 2015-11-10 NOTE — Progress Notes (Signed)
Patient ID: Darryl Hunt, male   DOB: 11-08-1960, 56 y.o.   MRN: [id number]    Reason for Appointment: Diabetes follow-up   History of Present Illness   Diagnosis: Type 2 DIABETES MELITUS, date of diagnosis: 2010        PREVIOUS history: He has been treated previously with regimen of metformin, glipizide and subsequently Victoza Overall has had fairly good control and glipizide was stopped because of tendency to hypoglycemia His A1c in the past has ranged from 6.7-7%, was 7% in 4/14 He was started on Invokana in 8/14; with this he started losing weight and blood sugars were consistently improved Because of recurrent balanitis he stopped this on his visit in 8/16  RECENT history:   He is currently on a regimen of Victoza 1.8 mg Amaryl 4 mg in a.m. and metformin 1 g twice a day  A1c has been consistently below 7% previously but now has gone up to 8% as of 3/17 Because of this he was given Amaryl 4 mg daily in addition for hyperglycemia, lab glucose was 223 midday  He had gained a significant amount of weight and this is mostly from his inactivity subsequent to his ankle fracture in January as well as poor diet, and consistent routine and lack of exercise.  Since his last visit he thinks he is watching his portions and carbohydrates but has not lost any weight Only doing a little walking, limited by his ankle swelling Blood sugars are better with Amaryl but he has done only one reading after supper Has mild increase in fasting readings but nearly normal readings without hypoglycemia in the afternoon  Side effects from medications: balanitis from Invokana. Monitors blood glucose:  Infrequently     Glucometer: FreeStyle Blood Glucose readings:   Mean values apply above for all meters except median for One Touch  PRE-MEAL Fasting Lunch Dinner Bedtime Overall  Glucose range: 114-169   86-108  191    Mean/median: 148   100   142   Physical activity: exercise: walking  some, limited by ankle pain, was doing upto 3 miles 3/7 days     Wt Readings from Last 3 Encounters:  11/10/15 330 lb 6 oz (149.857 kg)  09/27/15 330 lb 3.2 oz (149.778 kg)  04/18/15 320 lb 6.4 oz (145.332 kg)   Lab Results  Component Value Date   HGBA1C 8.0* 09/26/2015   HGBA1C 6.8 04/18/2015   HGBA1C 6.5 02/02/2015   Lab Results  Component Value Date   MICROALBUR 2.7* 09/26/2015   LDLCALC 69 09/27/2015   CREATININE 0.71 09/26/2015     No visits with results within 1 Week(s) from this visit. Latest known visit with results is:  Lab on 09/27/2015  Component Date Value Ref Range Status  . Cholesterol 09/27/2015 138  0 - 200 mg/dL Final   ATP III Classification       Desirable:  < 200 mg/dL               Borderline High:  200 - 239 mg/dL          High:  > = 409 mg/dL  . Triglycerides 09/27/2015 145.0  0.0 - 149.0 mg/dL Final   Normal:  <811 mg/dLBorderline High:  150 - 199 mg/dL  . HDL 09/27/2015 39.80  >39.00 mg/dL Final  . VLDL 91/47/8295 29.0  0.0 - 40.0 mg/dL Final  . LDL Cholesterol 09/27/2015 69  0 - 99 mg/dL Final  . Total CHOL/HDL Ratio  09/27/2015 3   Final                  Men          Women1/2 Average Risk     3.4          3.3Average Risk          5.0          4.42X Average Risk          9.6          7.13X Average Risk          15.0          11.0                      . NonHDL 09/27/2015 97.87   Final   NOTE:  Non-HDL goal should be 30 mg/dL higher than patient's LDL goal (i.e. LDL goal of < 70 mg/dL, would have non-HDL goal of < 100 mg/dL)      Medication List       This list is accurate as of: 11/10/15 10:56 AM.  Always use your most recent med list.               Co Q 10 100 MG Caps  Take 300 mg by mouth. Reported on 11/10/2015     etodolac 300 MG capsule  Commonly known as:  LODINE  Take 1 capsule (300 mg total) by mouth every 8 (eight) hours.     fish oil-omega-3 fatty acids 1000 MG capsule  Take 2 g by mouth daily. 2 tablets twice a day      FREESTYLE LITE Devi  Use to check blood sugars three times daily     glimepiride 4 MG tablet  Commonly known as:  AMARYL  Take 1 tablet (4 mg total) by mouth daily before breakfast.     glucose blood test strip  Commonly known as:  FREESTYLE LITE  Use as instructed to check blood sugars 3 times a day dx code E11.65     Insulin Pen Needle 30G X 8 MM Misc  Commonly known as:  NOVOFINE  Inject 10 each into the skin as needed. Use one per day to inject victoza     lisinopril 10 MG tablet  Commonly known as:  PRINIVIL,ZESTRIL  Take 1 tablet (10 mg total) by mouth daily.     loratadine 10 MG tablet  Commonly known as:  CLARITIN  Take 10 mg by mouth daily.     metFORMIN 1000 MG tablet  Commonly known as:  GLUCOPHAGE  Take 1 tablet by mouth two  times daily     methocarbamol 500 MG tablet  Commonly known as:  ROBAXIN  Reported on 11/10/2015     methocarbamol 750 MG tablet  Commonly known as:  ROBAXIN  Take 750 mg by mouth daily. Reported on 11/10/2015     mometasone 50 MCG/ACT nasal spray  Commonly known as:  NASONEX  SPRAY TWO SPRAYS IN EACH NOSTRIL ONCE A DAY     montelukast 10 MG tablet  Commonly known as:  SINGULAIR  Reported on 11/10/2015     omeprazole 20 MG capsule  Commonly known as:  PRILOSEC  Take 20 mg by mouth daily.     simvastatin 40 MG tablet  Commonly known as:  ZOCOR     VICTOZA 18 MG/3ML Sopn  Generic drug:  Liraglutide  Inject subcutaneously 1.8mg  every day  Allergies: No Known Allergies  Past Medical History  Diagnosis Date  . Sleep apnea   . Asthma   . Hypertension   . Hyperlipidemia     No past surgical history on file.  Family History  Problem Relation Age of Onset  . Cancer Mother   . Hypertension Mother   . Cancer Father   . Hypertension Father   . Hypertension Brother   . Diabetes Brother   . Diabetes Maternal Grandmother     Social History:  reports that he has never smoked. He has never used smokeless tobacco. His  alcohol and drug histories are not on file.  Review of Systems:   HYPERTENSION:   Is well-controlled with lisinopril, now on 10mg   HYPERLIPIDEMIA: The lipid abnormality consists of elevated LDL His WelChol was stopped as LDL had gone down to 54 and is only on simvastatin    Lab Results  Component Value Date   CHOL 138 09/27/2015   HDL 39.80 09/27/2015   LDLCALC 69 09/27/2015   LDLDIRECT 91.1 05/27/2014   TRIG 145.0 09/27/2015   CHOLHDL 3 09/27/2015     Diabetic foot exam in 8/16 shows normal monofilament sensation in the toes and plantar surfaces, no skin lesions or ulcers on the feet and normal pedal pulses  He is on treatment for sleep apnea      Examination:   BP 144/82 mmHg  Pulse 76  Temp(Src) 98.6 F (37 C) (Oral)  Wt 330 lb 6 oz (149.857 kg)  SpO2 97%  Body mass index is 43.6 kg/(m^2).    He has relatively swollen right lower leg with 2+ edema He has some stasis pigmentation on the anterior side also  ASSESSMENT/ PLAN:  Diabetes type 2 With morbid obesity  See history of present illness for detailed discussion of current diabetes management, blood sugar patterns and problems identified His blood sugars are Improving with adding Amaryl. However discussed with patient that this is not a long-term option He does need to start losing weight which he has not been able to  Recommendations:   He does need to get back into his exercise regimen with increasing walking  Consistent diet  He will start increasing his Victoza with additional 0.6 mg for the next week and then 1.2  Will send a prescription for the Saxenda 3.0 mg which apparently is covered, discussed differences between this and Victoza  Hypertension: has continued good control   He will come back in 8 weeks for review of his diabetes control and A1c    Darryl Hunt 11/10/2015, 10:56 AM

## 2015-11-21 ENCOUNTER — Other Ambulatory Visit: Payer: Self-pay | Admitting: Endocrinology

## 2015-12-16 ENCOUNTER — Other Ambulatory Visit: Payer: Self-pay | Admitting: Endocrinology

## 2016-01-23 ENCOUNTER — Other Ambulatory Visit: Payer: BLUE CROSS/BLUE SHIELD

## 2016-01-27 ENCOUNTER — Other Ambulatory Visit (INDEPENDENT_AMBULATORY_CARE_PROVIDER_SITE_OTHER): Payer: BLUE CROSS/BLUE SHIELD

## 2016-01-27 ENCOUNTER — Encounter: Payer: Self-pay | Admitting: Endocrinology

## 2016-01-27 ENCOUNTER — Ambulatory Visit (INDEPENDENT_AMBULATORY_CARE_PROVIDER_SITE_OTHER): Payer: BLUE CROSS/BLUE SHIELD | Admitting: Endocrinology

## 2016-01-27 VITALS — BP 114/64 | HR 86 | Ht 73.0 in | Wt 327.0 lb

## 2016-01-27 DIAGNOSIS — E1165 Type 2 diabetes mellitus with hyperglycemia: Secondary | ICD-10-CM

## 2016-01-27 LAB — BASIC METABOLIC PANEL
BUN: 16 mg/dL (ref 6–23)
CALCIUM: 9.5 mg/dL (ref 8.4–10.5)
CO2: 29 mEq/L (ref 19–32)
Chloride: 101 mEq/L (ref 96–112)
Creatinine, Ser: 0.89 mg/dL (ref 0.40–1.50)
GFR: 93.81 mL/min (ref >60.00)
GLUCOSE: 149 mg/dL — AB (ref 70–99)
POTASSIUM: 4.4 meq/L (ref 3.5–5.1)
Sodium: 137 mEq/L (ref 135–145)

## 2016-01-27 LAB — HEMOGLOBIN A1C: Hgb A1c MFr Bld: 7.4 % — ABNORMAL HIGH (ref 4.6–6.5)

## 2016-01-27 MED ORDER — GLIMEPIRIDE 4 MG PO TABS: ORAL_TABLET | ORAL | Status: DC

## 2016-01-27 NOTE — Progress Notes (Signed)
Patient ID: Darryl Hunt, male   DOB: Apr 13, 1961, 55 y.o.   MRN: [id number]    Reason for Appointment: Diabetes follow-up   History of Present Illness   Diagnosis: Type 2 DIABETES MELITUS, date of diagnosis: 2010        PREVIOUS history: He has been treated previously with regimen of metformin, glipizide and subsequently Victoza Overall has had fairly good control and glipizide was stopped because of tendency to hypoglycemia His A1c in the past has ranged from 6.7-7%, was 7% in 4/14 He was started on Invokana in 8/14; with this he started losing weight and blood sugars were consistently improved Because of recurrent balanitis he stopped this on his visit in 8/16  RECENT history:   He is currently on a regimen of Saxenda 3.0 mg, Amaryl 2 mg twice a day. and metformin 1 g twice a day  A1c has been consistently below 7% previously but was 8% in March Because of this he was given Amaryl 4 mg daily in addition for hyperglycemia  Current management, blood sugar patterns and problems identified:  He has checked his blood sugars only infrequently with 11 readings in the last month  Although with changing his Amaryl to 2 mg twice a day his afternoon readings are excellent day tending to be high the morning  Has not done any readings recently after supper, previously once over 200  He has lost a little 3 pounds, previously was inactive because of ankle fracture but now not walking as much as he used to  He thinks she is trying to do better on diet  With changing Victoza to Saxenda his weight has not come down much and this is despite his getting better satiety with this  Side effects from medications: balanitis from Invokana. Monitors blood glucose:  Infrequently     Glucometer: FreeStyle Blood Glucose readings:   Overall AVERAGE 141 FASTING range 42-168 with average 152 in nonfasting 94-186 with only one high reading midday  Physical activity: exercise: walking on  3/7 days     Wt Readings from Last 3 Encounters:  01/27/16 327 lb (148.326 kg)  11/10/15 330 lb 6 oz (149.857 kg)  09/27/15 330 lb 3.2 oz (149.778 kg)   Lab Results  Component Value Date   HGBA1C 8.0* 09/26/2015   HGBA1C 6.8 04/18/2015   HGBA1C 6.5 02/02/2015   Lab Results  Component Value Date   MICROALBUR 2.7* 09/26/2015   LDLCALC 69 09/27/2015   CREATININE 0.71 09/26/2015     No visits with results within 1 Week(s) from this visit. Latest known visit with results is:  Lab on 09/27/2015  Component Date Value Ref Range Status  . Cholesterol 09/27/2015 138  0 - 200 mg/dL Final   ATP III Classification       Desirable:  < 200 mg/dL               Borderline High:  200 - 239 mg/dL          High:  > = 829240 mg/dL  . Triglycerides 09/27/2015 145.0  0.0 - 149.0 mg/dL Final   Normal:  <562<150 mg/dLBorderline High:  150 - 199 mg/dL  . HDL 09/27/2015 39.80  >39.00 mg/dL Final  . VLDL 13/08/657803/21/2017 29.0  0.0 - 40.0 mg/dL Final  . LDL Cholesterol 09/27/2015 69  0 - 99 mg/dL Final  . Total CHOL/HDL Ratio 09/27/2015 3   Final  Men          Women1/2 Average Risk     3.4          3.3Average Risk          5.0          4.42X Average Risk          9.6          7.13X Average Risk          15.0          11.0                      . NonHDL 09/27/2015 97.87   Final   NOTE:  Non-HDL goal should be 30 mg/dL higher than patient's LDL goal (i.e. LDL goal of < 70 mg/dL, would have non-HDL goal of < 100 mg/dL)      Medication List       This list is accurate as of: 01/27/16 10:35 AM.  Always use your most recent med list.               Co Q 10 100 MG Caps  Take 300 mg by mouth. Reported on 01/27/2016     etodolac 300 MG capsule  Commonly known as:  LODINE  Take 1 capsule (300 mg total) by mouth every 8 (eight) hours.     fish oil-omega-3 fatty acids 1000 MG capsule  Take 2 g by mouth daily. 2 tablets twice a day     FREESTYLE LITE Devi  Use to check blood sugars three times  daily     FREESTYLE LITE test strip  Generic drug:  glucose blood  USE TO TEST BLOOD SUGAR THREE TIMES A DAY AS INSTRUCTED     glimepiride 4 MG tablet  Commonly known as:  AMARYL  Take 1 tablet (4 mg total) by mouth daily before breakfast.     Insulin Pen Needle 30G X 8 MM Misc  Commonly known as:  NOVOFINE  Inject 10 each into the skin as needed. Use one per day to inject victoza     Liraglutide -Weight Management 18 MG/3ML Sopn  Commonly known as:  SAXENDA  Inject 3 mg into the skin daily.     lisinopril 10 MG tablet  Commonly known as:  PRINIVIL,ZESTRIL  Take 1 tablet by mouth  daily     loratadine 10 MG tablet  Commonly known as:  CLARITIN  Take 10 mg by mouth daily.     metFORMIN 1000 MG tablet  Commonly known as:  GLUCOPHAGE  Take 1 tablet by mouth two  times daily     methocarbamol 500 MG tablet  Commonly known as:  ROBAXIN  Reported on 01/27/2016     methocarbamol 750 MG tablet  Commonly known as:  ROBAXIN  Take 750 mg by mouth daily. Reported on 01/27/2016     mometasone 50 MCG/ACT nasal spray  Commonly known as:  NASONEX  SPRAY TWO SPRAYS IN EACH NOSTRIL ONCE A DAY     montelukast 10 MG tablet  Commonly known as:  SINGULAIR  Reported on 01/27/2016     omeprazole 20 MG capsule  Commonly known as:  PRILOSEC  Take 20 mg by mouth daily.     simvastatin 40 MG tablet  Commonly known as:  ZOCOR     VICTOZA 18 MG/3ML Sopn  Generic drug:  Liraglutide  Inject subcutaneously 1.8mg  every day  Allergies: No Known Allergies  Past Medical History  Diagnosis Date  . Sleep apnea   . Asthma   . Hypertension   . Hyperlipidemia     No past surgical history on file.  Family History  Problem Relation Age of Onset  . Cancer Mother   . Hypertension Mother   . Cancer Father   . Hypertension Father   . Hypertension Brother   . Diabetes Brother   . Diabetes Maternal Grandmother     Social History:  reports that he has never smoked. He has never  used smokeless tobacco. His alcohol and drug histories are not on file.  Review of Systems:   HYPERTENSION:   Is well-controlled with lisinopril,  on 10mg   HYPERLIPIDEMIA: The lipid abnormality consists of elevated LDL Is getting good control with simvastatin  Lab Results  Component Value Date   CHOL 138 09/27/2015   HDL 39.80 09/27/2015   LDLCALC 69 09/27/2015   LDLDIRECT 91.1 05/27/2014   TRIG 145.0 09/27/2015   CHOLHDL 3 09/27/2015     Diabetic foot exam in 8/16 shows normal monofilament sensation in the toes and plantar surfaces, no skin lesions or ulcers on the feet and normal pedal pulses  He is on treatment for sleep apnea      Examination:   BP 114/64 mmHg  Pulse 86  Ht 6\' 1"  (1.854 m)  Wt 327 lb (148.326 kg)  BMI 43.15 kg/m2  SpO2 94%  Body mass index is 43.15 kg/(m^2).     ASSESSMENT/ PLAN:  Diabetes type 2 With morbid obesity  See history of present illness for detailed discussion of current diabetes management, blood sugar patterns and problems identified His blood sugars are Fairly good except high readings in the mornings He has lost only 3 pounds since his last visit despite taking Saxenda instead of Victoza  Recommendations:   He does need to get more consistent with his exercise regimen with increasing walking  Consistent control of calories diet  More readings after supper on monitoring  Try Amaryl 4 mg in the evening instead of 2 mg  A1c to be checked today   Hypertension: has continued good control      Apple Dearmas 01/27/2016, 10:35 AM

## 2016-01-27 NOTE — Patient Instructions (Signed)
Check blood sugars on waking up 3  times a week Also check blood sugars about 2 hours after a meal and do this after different meals by rotation  Recommended blood sugar levels on waking up is 90-130 and about 2 hours after meal is 130-160  Please bring your blood sugar monitor to each visit, thank you  Exercise daily  Amaryl 4mg  in pm

## 2016-02-01 ENCOUNTER — Telehealth: Payer: Self-pay

## 2016-02-01 NOTE — Telephone Encounter (Signed)
Called patient to give lab results. Patient had no questions or concerns.

## 2016-02-19 ENCOUNTER — Other Ambulatory Visit: Payer: Self-pay | Admitting: Endocrinology

## 2016-02-20 ENCOUNTER — Other Ambulatory Visit: Payer: Self-pay

## 2016-02-20 MED ORDER — ETODOLAC 300 MG PO CAPS: 300.0000 mg | ORAL_CAPSULE | Freq: Three times a day (TID) | ORAL | 1 refills | Status: DC

## 2016-03-01 LAB — HM DIABETES EYE EXAM

## 2016-03-15 ENCOUNTER — Other Ambulatory Visit: Payer: Self-pay | Admitting: Endocrinology

## 2016-04-25 ENCOUNTER — Encounter: Payer: Self-pay | Admitting: *Deleted

## 2016-04-25 ENCOUNTER — Other Ambulatory Visit (INDEPENDENT_AMBULATORY_CARE_PROVIDER_SITE_OTHER): Payer: BLUE CROSS/BLUE SHIELD

## 2016-04-25 DIAGNOSIS — E1165 Type 2 diabetes mellitus with hyperglycemia: Secondary | ICD-10-CM

## 2016-04-25 LAB — BASIC METABOLIC PANEL
BUN: 11 mg/dL (ref 6–23)
CO2: 27 mEq/L (ref 19–32)
CREATININE: 0.76 mg/dL (ref 0.40–1.50)
Calcium: 9.2 mg/dL (ref 8.4–10.5)
Chloride: 103 mEq/L (ref 96–112)
GFR: 112.47 mL/min (ref >60.00)
Glucose, Bld: 234 mg/dL — ABNORMAL HIGH (ref 70–99)
POTASSIUM: 3.8 meq/L (ref 3.5–5.1)
Sodium: 138 mEq/L (ref 135–145)

## 2016-04-25 LAB — LIPID PANEL
CHOL/HDL RATIO: 3
Cholesterol: 136 mg/dL (ref 0–200)
HDL: 43.6 mg/dL (ref >39.00)
LDL Cholesterol: 70 mg/dL (ref 0–99)
NONHDL: 92.67
Triglycerides: 112 mg/dL (ref 0.0–149.0)
VLDL: 22.4 mg/dL (ref 0.0–40.0)

## 2016-04-25 LAB — HEMOGLOBIN A1C: HEMOGLOBIN A1C: 7.5 % — AB (ref 4.6–6.5)

## 2016-04-26 ENCOUNTER — Other Ambulatory Visit: Payer: Self-pay | Admitting: *Deleted

## 2016-04-26 ENCOUNTER — Encounter: Payer: Self-pay | Admitting: Endocrinology

## 2016-04-26 ENCOUNTER — Ambulatory Visit (INDEPENDENT_AMBULATORY_CARE_PROVIDER_SITE_OTHER): Payer: BLUE CROSS/BLUE SHIELD | Admitting: Endocrinology

## 2016-04-26 VITALS — BP 124/76 | HR 78 | Temp 98.0°F | Resp 14 | Ht 73.0 in | Wt 327.6 lb

## 2016-04-26 DIAGNOSIS — E1165 Type 2 diabetes mellitus with hyperglycemia: Secondary | ICD-10-CM | POA: Diagnosis not present

## 2016-04-26 DIAGNOSIS — Z23 Encounter for immunization: Secondary | ICD-10-CM | POA: Diagnosis not present

## 2016-04-26 MED ORDER — LISINOPRIL 10 MG PO TABS: 10.0000 mg | ORAL_TABLET | Freq: Every day | ORAL | 1 refills | Status: DC

## 2016-04-26 MED ORDER — METFORMIN HCL 1000 MG PO TABS: 1000.0000 mg | ORAL_TABLET | Freq: Two times a day (BID) | ORAL | 1 refills | Status: DC

## 2016-04-26 MED ORDER — SIMVASTATIN 40 MG PO TABS: 40.0000 mg | ORAL_TABLET | Freq: Every day | ORAL | 1 refills | Status: DC

## 2016-04-26 NOTE — Progress Notes (Signed)
Patient ID: Darryl Hunt, male   DOB: 03-08-1961, 55 y.o.   MRN: [id number]    Reason for Appointment: follow-up   History of Present Illness   Diagnosis: Type 2 DIABETES MELITUS, date of diagnosis: 2010        PREVIOUS history: He has been treated previously with regimen of metformin, glipizide and subsequently Victoza Overall has had fairly good control and glipizide was stopped because of tendency to hypoglycemia His A1c in the past has ranged from 6.7-7%, was 7% in 4/14 He was started on Invokana in 8/14; with this he started losing weight and blood sugars were consistently improved Because of recurrent balanitis he stopped this on his visit in 8/16  RECENT history:   He is currently on a regimen of Saxenda 3.0 mg, Amaryl 2 mg twice a day. and metformin 1 g twice a day  A1c has been consistently above 7% recently, now 7.5  but was 8% in March  Current management, blood sugar patterns and problems identified:  He has checked his blood sugars only infrequently and did not bring his monitor  However he thinks he is checking some readings after meals with the highest about 190 postprandially  Glucose was over 200 in the lab after eating a fast food biscuit  Amaryl was increased to 4 mg in the evening on the last visit  With changing Victoza to Saxenda his weight has not come down recently although in the last month he thinks it is helping his satiety better.  He has not been motivated to do any exercise and is only walking infrequently  Side effects from medications: balanitis from Invokana. Monitors blood glucose:  Infrequently     Glucometer: FreeStyle  Blood Glucose readings by recall: 160 average, upto 190, lowest 110  Physical activity: exercise: walking 2 miles on 1/7 day     Wt Readings from Last 3 Encounters:  04/26/16 (!) 327 lb 9.6 oz (148.6 kg)  01/27/16 (!) 327 lb (148.3 kg)  11/10/15 (!) 330 lb 6 oz (149.9 kg)   Lab Results  Component  Value Date   HGBA1C 7.5 (H) 04/25/2016   HGBA1C 7.4 (H) 01/27/2016   HGBA1C 8.0 (H) 09/26/2015   Lab Results  Component Value Date   MICROALBUR 2.7 (H) 09/26/2015   LDLCALC 70 04/25/2016   CREATININE 0.76 04/25/2016    OTHER active problems: See review of systems   Abstract on 04/25/2016  Component Date Value Ref Range Status  . HM Diabetic Eye Exam 03/01/2016 No Retinopathy  No Retinopathy Final  Lab on 04/25/2016  Component Date Value Ref Range Status  . Sodium 04/25/2016 138  135 - 145 mEq/L Final  . Potassium 04/25/2016 3.8  3.5 - 5.1 mEq/L Final  . Chloride 04/25/2016 103  96 - 112 mEq/L Final  . CO2 04/25/2016 27  19 - 32 mEq/L Final  . Glucose, Bld 04/25/2016 234* 70 - 99 mg/dL Final  . BUN 86/57/846910/18/2017 11  6 - 23 mg/dL Final  . Creatinine, Ser 04/25/2016 0.76  0.40 - 1.50 mg/dL Final  . Calcium 62/95/284110/18/2017 9.2  8.4 - 10.5 mg/dL Final  . GFR 32/44/010210/18/2017 112.47  >60.00 mL/min Final  . Hgb A1c MFr Bld 04/25/2016 7.5* 4.6 - 6.5 % Final  . Cholesterol 04/25/2016 136  0 - 200 mg/dL Final  . Triglycerides 04/25/2016 112.0  0.0 - 149.0 mg/dL Final  . HDL 72/53/664410/18/2017 43.60  >39.00 mg/dL Final  . VLDL 03/47/425910/18/2017 22.4  0.0 -  40.0 mg/dL Final  . LDL Cholesterol 04/25/2016 70  0 - 99 mg/dL Final  . Total CHOL/HDL Ratio 04/25/2016 3   Final  . NonHDL 04/25/2016 92.67   Final      Medication List       Accurate as of 04/26/16  9:17 AM. Always use your most recent med list.          Co Q 10 100 MG Caps Take 300 mg by mouth. Reported on 01/27/2016   etodolac 300 MG capsule Commonly known as:  LODINE Take 1 capsule (300 mg total) by mouth every 8 (eight) hours.   fish oil-omega-3 fatty acids 1000 MG capsule Take 2 g by mouth daily. 2 tablets twice a day   FREESTYLE LITE Devi Use to check blood sugars three times daily   FREESTYLE LITE test strip Generic drug:  glucose blood USE TO TEST BLOOD SUGAR THREE TIMES A DAY AS INSTRUCTED   glimepiride 4 MG tablet Commonly  known as:  AMARYL 1/2 tab in am and 1 in pm   Insulin Pen Needle 30G X 8 MM Misc Commonly known as:  NOVOFINE Inject 10 each into the skin as needed. Use one per day to inject victoza   lisinopril 10 MG tablet Commonly known as:  PRINIVIL,ZESTRIL Take 1 tablet by mouth  daily   loratadine 10 MG tablet Commonly known as:  CLARITIN Take 10 mg by mouth daily.   metFORMIN 1000 MG tablet Commonly known as:  GLUCOPHAGE Take 1 tablet by mouth two  times daily   methocarbamol 500 MG tablet Commonly known as:  ROBAXIN Reported on 01/27/2016   methocarbamol 750 MG tablet Commonly known as:  ROBAXIN Take 750 mg by mouth daily. Reported on 01/27/2016   mometasone 50 MCG/ACT nasal spray Commonly known as:  NASONEX SPRAY TWO SPRAYS IN EACH NOSTRIL ONCE A DAY   montelukast 10 MG tablet Commonly known as:  SINGULAIR Reported on 01/27/2016   omeprazole 20 MG capsule Commonly known as:  PRILOSEC Take 20 mg by mouth daily.   SAXENDA 18 MG/3ML Sopn Generic drug:  Liraglutide -Weight Management Inject 3 mg into the skin  daily.   simvastatin 40 MG tablet Commonly known as:  ZOCOR       Allergies: No Known Allergies  Past Medical History:  Diagnosis Date  . Asthma   . Hyperlipidemia   . Hypertension   . Sleep apnea     No past surgical history on file.  Family History  Problem Relation Age of Onset  . Cancer Mother   . Hypertension Mother   . Cancer Father   . Hypertension Father   . Hypertension Brother   . Diabetes Brother   . Diabetes Maternal Grandmother     Social History:  reports that he has never smoked. He has never used smokeless tobacco. His alcohol and drug histories are not on file.  Review of Systems:   HYPERTENSION:   Is well-controlled with lisinopril, on 10mg   HYPERLIPIDEMIA: The lipid abnormality consists of elevated LDL Has good control with simvastatin  Lab Results  Component Value Date   CHOL 136 04/25/2016   HDL 43.60 04/25/2016    LDLCALC 70 04/25/2016   LDLDIRECT 91.1 05/27/2014   TRIG 112.0 04/25/2016   CHOLHDL 3 04/25/2016   Rare tingling in feet   Diabetic foot exam in 10/17 shows normal monofilament sensation in the toes and plantar surfaces, no skin lesions or ulcers on the feet and normal pedal pulses  He is on treatment for sleep apnea   Eye exam 7/17   Examination:   BP 124/76   Pulse 78   Temp 98 F (36.7 C)   Resp 14   Ht 6\' 1"  (1.854 m)   Wt (!) 327 lb 9.6 oz (148.6 kg)   SpO2 98%   BMI 43.22 kg/m   Body mass index is 43.22 kg/m.   Diabetic Foot Exam - Simple   Simple Foot Form Diabetic Foot exam was performed with the following findings:  Yes 04/26/2016  9:29 AM  Visual Inspection No deformities, no ulcerations, no other skin breakdown bilaterally:  Yes Sensation Testing Intact to touch and monofilament testing bilaterally:  Yes Pulse Check Posterior Tibialis and Dorsalis pulse intact bilaterally:  Yes Comments      ASSESSMENT/ PLAN:  Diabetes type 2 With morbid obesity  See history of present illness for detailed discussion of current diabetes management, blood sugar patterns and problems identified His blood sugars are not optimally controlled with A1c 7.5 His main difficulty is losing weight even with Saxenda He can do much better with exercise regimen which he is not doing except only about once a week He thinks that his satiety is better in the last month Blood sugars are reportedly fairly good at home with checking frequently  Recommendations:   He does need to get more consistent with his exercise regimen with increasing walking to at least 3-4 times a week  Consistent control of diet  More readings after meals and bring monitor for download on each visit  No change in medications as yet  HYPERLIPIDEMIA: Adequately controlled   Hypertension: has continued good control   Influenza vaccine given   [facility] 04/26/2016, 9:17 AM

## 2016-05-21 ENCOUNTER — Other Ambulatory Visit: Payer: Self-pay | Admitting: Endocrinology

## 2016-07-24 ENCOUNTER — Other Ambulatory Visit (INDEPENDENT_AMBULATORY_CARE_PROVIDER_SITE_OTHER): Payer: BLUE CROSS/BLUE SHIELD

## 2016-07-24 DIAGNOSIS — E1165 Type 2 diabetes mellitus with hyperglycemia: Secondary | ICD-10-CM | POA: Diagnosis not present

## 2016-07-24 LAB — COMPREHENSIVE METABOLIC PANEL
ALT: 29 U/L (ref 0–53)
AST: 24 U/L (ref 0–37)
Albumin: 3.7 g/dL (ref 3.5–5.2)
Alkaline Phosphatase: 72 U/L (ref 39–117)
BUN: 11 mg/dL (ref 6–23)
CHLORIDE: 104 meq/L (ref 96–112)
CO2: 28 meq/L (ref 19–32)
CREATININE: 0.71 mg/dL (ref 0.40–1.50)
Calcium: 8.7 mg/dL (ref 8.4–10.5)
GFR: 121.55 mL/min (ref >60.00)
Glucose, Bld: 154 mg/dL — ABNORMAL HIGH (ref 70–99)
POTASSIUM: 4 meq/L (ref 3.5–5.1)
SODIUM: 137 meq/L (ref 135–145)
Total Bilirubin: 1 mg/dL (ref 0.2–1.2)
Total Protein: 6.5 g/dL (ref 6.0–8.3)

## 2016-07-24 LAB — HEMOGLOBIN A1C: HEMOGLOBIN A1C: 7.4 % — AB (ref 4.6–6.5)

## 2016-07-24 LAB — MICROALBUMIN / CREATININE URINE RATIO
CREATININE, U: 145.4 mg/dL
MICROALB UR: 1.4 mg/dL (ref 0.0–1.9)
MICROALB/CREAT RATIO: 1 mg/g (ref 0.0–30.0)

## 2016-07-27 ENCOUNTER — Ambulatory Visit: Payer: BLUE CROSS/BLUE SHIELD | Admitting: Endocrinology

## 2016-08-04 ENCOUNTER — Other Ambulatory Visit: Payer: Self-pay | Admitting: Endocrinology

## 2016-08-16 ENCOUNTER — Ambulatory Visit: Payer: BLUE CROSS/BLUE SHIELD | Admitting: Endocrinology

## 2016-08-16 ENCOUNTER — Ambulatory Visit (INDEPENDENT_AMBULATORY_CARE_PROVIDER_SITE_OTHER): Payer: BLUE CROSS/BLUE SHIELD | Admitting: Endocrinology

## 2016-08-16 ENCOUNTER — Encounter: Payer: Self-pay | Admitting: Endocrinology

## 2016-08-16 VITALS — BP 110/58 | HR 85 | Temp 98.5°F | Resp 16 | Ht 73.0 in | Wt 323.0 lb

## 2016-08-16 DIAGNOSIS — E1165 Type 2 diabetes mellitus with hyperglycemia: Secondary | ICD-10-CM | POA: Diagnosis not present

## 2016-08-16 NOTE — Progress Notes (Signed)
Patient ID: Darryl Hunt, male   DOB: [birth year], 56 y.o.   MRN: [id number]    Reason for Appointment: follow-up   History of Present Illness   Diagnosis: Type 2 DIABETES MELITUS, date of diagnosis: 2010        PREVIOUS history: He has been treated previously with regimen of metformin, glipizide and subsequently Victoza Overall has had fairly good control and glipizide was stopped because of tendency to hypoglycemia His A1c in the past has ranged from 6.7-7%, was 7% in 4/14 He was started on Invokana in 8/14; with this he started losing weight and blood sugars were consistently improved Because of recurrent balanitis he stopped this on his visit in 8/16  RECENT history:   He is currently on a regimen of Saxenda 3.0 mg, Amaryl 2 mg--4mg  and metformin 1 g twice a day  A1c has been consistently above 7%, now 7.4  Current management, blood sugar patterns and problems identified:  He has checked his blood sugars only infrequently and partly because he lost his meter until recently  He tends to have relatively high readings in the mornings and better readings in the afternoons  However has not done readings after his evening meal as directed  He has started walking during the day at work  His weight is down 4 pounds  Overall he is trying to control portions better also with continuing Saxenda  Side effects from medications: balanitis from Invokana. Monitors blood glucose:  Infrequently     Glucometer: FreeStyle  Blood Glucose readings by download:  AVERAGE 153 Morning range 141-181 Nonfasting range 100-168 No readings after supper  Physical activity: exercise: walking 2 miles 3-4/7    Wt Readings from Last 3 Encounters:  08/16/16 (!) 323 lb (146.5 kg)  04/26/16 (!) 327 lb 9.6 oz (148.6 kg)  01/27/16 (!) 327 lb (148.3 kg)   Lab Results  Component Value Date   HGBA1C 7.4 (H) 07/24/2016   HGBA1C 7.5 (H) 04/25/2016   HGBA1C 7.4 (H) 01/27/2016   Lab  Results  Component Value Date   MICROALBUR 1.4 07/24/2016   LDLCALC 70 04/25/2016   CREATININE 0.71 07/24/2016    OTHER active problems: See review of systems   No visits with results within 1 Week(s) from this visit.  Latest known visit with results is:  Lab on 07/24/2016  Component Date Value Ref Range Status  . Hgb A1c MFr Bld 07/24/2016 7.4* 4.6 - 6.5 % Final  . Sodium 07/24/2016 137  135 - 145 mEq/L Final  . Potassium 07/24/2016 4.0  3.5 - 5.1 mEq/L Final  . Chloride 07/24/2016 104  96 - 112 mEq/L Final  . CO2 07/24/2016 28  19 - 32 mEq/L Final  . Glucose, Bld 07/24/2016 154* 70 - 99 mg/dL Final  . BUN 82/95/6213 11  6 - 23 mg/dL Final  . Creatinine, Ser 07/24/2016 0.71  0.40 - 1.50 mg/dL Final  . Total Bilirubin 07/24/2016 1.0  0.2 - 1.2 mg/dL Final  . Alkaline Phosphatase 07/24/2016 72  39 - 117 U/L Final  . AST 07/24/2016 24  0 - 37 U/L Final  . ALT 07/24/2016 29  0 - 53 U/L Final  . Total Protein 07/24/2016 6.5  6.0 - 8.3 g/dL Final  . Albumin 08/65/7846 3.7  3.5 - 5.2 g/dL Final  . Calcium 96/29/5284 8.7  8.4 - 10.5 mg/dL Final  . GFR 13/24/4010 121.55  >60.00 mL/min Final  . Microalb, Ur 07/24/2016 1.4  0.0 -  1.9 mg/dL Final  . Creatinine,U 16/04/9603 145.4  mg/dL Final  . Microalb Creat Ratio 07/24/2016 1.0  0.0 - 30.0 mg/g Final    Allergies as of 08/16/2016   No Known Allergies     Medication List       Accurate as of 08/16/16  3:39 PM. Always use your most recent med list.          etodolac 300 MG capsule Commonly known as:  LODINE Take 1 capsule (300 mg total) by mouth every 8 (eight) hours.   fish oil-omega-3 fatty acids 1000 MG capsule Take 2 g by mouth daily. 2 tablets twice a day   FREESTYLE LITE Devi Use to check blood sugars three times daily   FREESTYLE LITE test strip Generic drug:  glucose blood USE TO TEST BLOOD SUGAR THREE TIMES A DAY AS INSTRUCTED   glimepiride 4 MG tablet Commonly known as:  AMARYL TAKE ONE-HALF TABLET BY MOUTH  IN MORNING AND ONE TABLET IN THE EVENING **DOSE INCREASE**   Insulin Pen Needle 30G X 8 MM Misc Commonly known as:  NOVOFINE Inject 10 each into the skin as needed. Use one per day to inject victoza   lisinopril 10 MG tablet Commonly known as:  PRINIVIL,ZESTRIL Take 1 tablet (10 mg total) by mouth daily.   loratadine 10 MG tablet Commonly known as:  CLARITIN Take 10 mg by mouth daily.   metFORMIN 1000 MG tablet Commonly known as:  GLUCOPHAGE Take 1 tablet (1,000 mg total) by mouth 2 (two) times daily.   mometasone 50 MCG/ACT nasal spray Commonly known as:  NASONEX SPRAY TWO SPRAYS IN EACH NOSTRIL ONCE A DAY   omeprazole 20 MG capsule Commonly known as:  PRILOSEC Take 20 mg by mouth daily.   ondansetron 4 MG tablet Commonly known as:  ZOFRAN TAKE 1 TABLET BY MOUTH EVERY 6 HOURS AS NEEDED FOR NAUSEA FOR 5 DAYS   SAXENDA 18 MG/3ML Sopn Generic drug:  Liraglutide -Weight Management Inject 3 mg into the skin  daily.   simvastatin 40 MG tablet Commonly known as:  ZOCOR Take 1 tablet (40 mg total) by mouth daily.       Allergies: No Known Allergies  Past Medical History:  Diagnosis Date  . Asthma   . Hyperlipidemia   . Hypertension   . Sleep apnea     History reviewed. No pertinent surgical history.  Family History  Problem Relation Age of Onset  . Cancer Mother   . Hypertension Mother   . Cancer Father   . Hypertension Father   . Hypertension Brother   . Diabetes Brother   . Diabetes Maternal Grandmother     Social History:  reports that he has never smoked. He has never used smokeless tobacco. He reports that he does not drink alcohol or use drugs.  Review of Systems:   HYPERTENSION:   Is well-controlled with lisinopril, on 10mg   HYPERLIPIDEMIA:   Has good control with simvastatin, triglycerides also normal  Lab Results  Component Value Date   CHOL 136 04/25/2016   HDL 43.60 04/25/2016   LDLCALC 70 04/25/2016   LDLDIRECT 91.1 05/27/2014    TRIG 112.0 04/25/2016   CHOLHDL 3 04/25/2016      Diabetic foot exam in 10/17 shows normal monofilament sensation in the toes and plantar surfaces, no skin lesions or ulcers on the feet and normal pedal pulses    Eye exam 7/17   Examination:   BP (!) 110/58   Pulse 85  Temp 98.5 F (36.9 C) (Oral)   Resp 16   Ht 6\' 1"  (1.854 m)   Wt (!) 323 lb (146.5 kg)   SpO2 97%   BMI 42.61 kg/m   Body mass index is 42.61 kg/m.    Repeat blood pressure with large cuff: 118/70  ASSESSMENT/ PLAN:  Diabetes type 2 With morbid obesity  See history of present illness for detailed discussion of current diabetes management, blood sugar patterns and problems identified  His blood sugars are not optimally controlled with A1c 7.4 He appears to have fairly consistently high readings in the mornings Although has lost a little weight gradually and is trying to do more walking he still has not lost weight  Recommendations:   He can try taking both his metformin at suppertime  A fasting readings are consistently high will had basal insulin at night  He will be fairly consistent with diet and exercise also for weight loss      Darryl Hunt 08/16/2016, 3:39 PM

## 2016-08-16 NOTE — Patient Instructions (Addendum)
Check blood sugars on waking up  2-3x weekly  Also check blood sugars about 2 hours after a meal and do this after different meals by rotation  Recommended blood sugar levels on waking up is 90-130 and about 2 hours after meal is 130-160  Please bring your blood sugar monitor to each visit, thank you  Take both Metformin at dinner and call if sugar still >150

## 2016-09-25 ENCOUNTER — Other Ambulatory Visit: Payer: Self-pay | Admitting: Endocrinology

## 2016-11-08 ENCOUNTER — Other Ambulatory Visit (INDEPENDENT_AMBULATORY_CARE_PROVIDER_SITE_OTHER): Payer: BLUE CROSS/BLUE SHIELD

## 2016-11-08 DIAGNOSIS — E1165 Type 2 diabetes mellitus with hyperglycemia: Secondary | ICD-10-CM | POA: Diagnosis not present

## 2016-11-08 LAB — COMPREHENSIVE METABOLIC PANEL
ALBUMIN: 4.1 g/dL (ref 3.5–5.2)
ALK PHOS: 70 U/L (ref 39–117)
ALT: 31 U/L (ref 0–53)
AST: 31 U/L (ref 0–37)
BILIRUBIN TOTAL: 1.5 mg/dL — AB (ref 0.2–1.2)
BUN: 11 mg/dL (ref 6–23)
CALCIUM: 9.2 mg/dL (ref 8.4–10.5)
CO2: 30 meq/L (ref 19–32)
CREATININE: 0.77 mg/dL (ref 0.40–1.50)
Chloride: 101 mEq/L (ref 96–112)
GFR: 110.57 mL/min (ref >60.00)
Glucose, Bld: 158 mg/dL — ABNORMAL HIGH (ref 70–99)
Potassium: 4 mEq/L (ref 3.5–5.1)
Sodium: 136 mEq/L (ref 135–145)
TOTAL PROTEIN: 6.9 g/dL (ref 6.0–8.3)

## 2016-11-08 LAB — HEMOGLOBIN A1C: Hgb A1c MFr Bld: 7.4 % — ABNORMAL HIGH (ref 4.6–6.5)

## 2016-11-13 ENCOUNTER — Ambulatory Visit: Payer: BLUE CROSS/BLUE SHIELD | Admitting: Endocrinology

## 2016-11-13 ENCOUNTER — Encounter: Payer: Self-pay | Admitting: Endocrinology

## 2016-11-13 ENCOUNTER — Ambulatory Visit (INDEPENDENT_AMBULATORY_CARE_PROVIDER_SITE_OTHER): Payer: BLUE CROSS/BLUE SHIELD | Admitting: Endocrinology

## 2016-11-13 VITALS — BP 140/80 | HR 84 | Ht 73.0 in | Wt 312.8 lb

## 2016-11-13 DIAGNOSIS — E1165 Type 2 diabetes mellitus with hyperglycemia: Secondary | ICD-10-CM | POA: Diagnosis not present

## 2016-11-13 MED ORDER — FREESTYLE LIBRE SENSOR SYSTEM MISC: 3 refills | Status: DC

## 2016-11-13 MED ORDER — FREESTYLE LIBRE READER DEVI: 1.0000 | 0 refills | Status: DC

## 2016-11-13 NOTE — Patient Instructions (Addendum)
More sugars after supper  Check blood sugars on waking up  2x weekly  Also check blood sugars about 2 hours after a meal and do this after different meals by rotation  Recommended blood sugar levels on waking up is 90-130 and about 2 hours after meal is 130-160  Please bring your blood sugar monitor to each visit, thank you  Glimeperide 1/2 am and 1 pm for now

## 2016-11-13 NOTE — Progress Notes (Signed)
Patient ID: Darryl Hunt, male   DOB: 1961/03/07, 56 y.o.   MRN: [id number]    Reason for Appointment: follow-up   History of Present Illness   Diagnosis: Type 2 DIABETES MELITUS, date of diagnosis: 2010        PREVIOUS history: He has been treated previously with regimen of metformin, glipizide and subsequently Victoza Overall has had fairly good control and glipizide was stopped because of tendency to hypoglycemia His A1c in the past has ranged from 6.7-7%, was 7% in 4/14 He was started on Invokana in 8/14; with this he started losing weight and blood sugars were consistently improved Because of recurrent balanitis he stopped this on his visit in 8/16  RECENT history:   He is currently on a regimen of Saxenda 3.0 mg, Amaryl 4mg  and metformin 2 g q a day  A1c has been consistently above 7%, again 7.4  Current management, blood sugar patterns and problems identified:  He was having high readings fasting averaging over 150 and he was told to take both his metformin at dinnertime  With this his morning sugars are significantly better  However on his own he changed his Amaryl to morning only, previously was taking 4 mg at suppertime  Even though his fasting readings are fairly good he is not seeing any improvement in his A1c  His weight has however come down significantly, this may be more related to overall trying to watch his diet and portions  He now says that he was having vomiting with taking Saxenda in the evening and with switching it to the morning his nausea has resolved  He is however not doing any walking except only once or twice a week  Does not check his readings AFTER his evening meal  A couple of times his blood sugars have been low normal before supper  Side effects from medications: balanitis from Invokana. Monitors blood glucose:  Infrequently, has taken readings in the last month   Glucometer: FreeStyle  Blood Glucose readings by review  of meter:  AVERAGE 117  Morning range 113,116,144 Pm 63, 70   Physical activity: exercise: walking 2 miles 1/7    Wt Readings from Last 3 Encounters:  11/13/16 (!) 312 lb 12.8 oz (141.9 kg)  08/16/16 (!) 323 lb (146.5 kg)  04/26/16 (!) 327 lb 9.6 oz (148.6 kg)   Lab Results  Component Value Date   HGBA1C 7.4 (H) 11/08/2016   HGBA1C 7.4 (H) 07/24/2016   HGBA1C 7.5 (H) 04/25/2016   Lab Results  Component Value Date   MICROALBUR 1.4 07/24/2016   LDLCALC 70 04/25/2016   CREATININE 0.77 11/08/2016    OTHER active problems: See review of systems   Lab on 11/08/2016  Component Date Value Ref Range Status  . Hgb A1c MFr Bld 11/08/2016 7.4* 4.6 - 6.5 % Final  . Sodium 11/08/2016 136  135 - 145 mEq/L Final  . Potassium 11/08/2016 4.0  3.5 - 5.1 mEq/L Final  . Chloride 11/08/2016 101  96 - 112 mEq/L Final  . CO2 11/08/2016 30  19 - 32 mEq/L Final  . Glucose, Bld 11/08/2016 158* 70 - 99 mg/dL Final  . BUN 21/30/865705/09/2016 11  6 - 23 mg/dL Final  . Creatinine, Ser 11/08/2016 0.77  0.40 - 1.50 mg/dL Final  . Total Bilirubin 11/08/2016 1.5* 0.2 - 1.2 mg/dL Final  . Alkaline Phosphatase 11/08/2016 70  39 - 117 U/L Final  . AST 11/08/2016 31  0 - 37  U/L Final  . ALT 11/08/2016 31  0 - 53 U/L Final  . Total Protein 11/08/2016 6.9  6.0 - 8.3 g/dL Final  . Albumin 40/98/1191 4.1  3.5 - 5.2 g/dL Final  . Calcium 47/82/9562 9.2  8.4 - 10.5 mg/dL Final  . GFR 13/02/6577 110.57  >60.00 mL/min Final    Allergies as of 11/13/2016   No Known Allergies     Medication List       Accurate as of 11/13/16  3:44 PM. Always use your most recent med list.          cetirizine 10 MG tablet Commonly known as:  ZYRTEC Take 10 mg by mouth daily.   etodolac 300 MG capsule Commonly known as:  LODINE Take 1 capsule (300 mg total) by mouth every 8 (eight) hours.   fish oil-omega-3 fatty acids 1000 MG capsule Take 2 g by mouth daily. 2 tablets twice a day   FREESTYLE LITE Devi Use to check blood  sugars three times daily   FREESTYLE LITE test strip Generic drug:  glucose blood USE TO TEST BLOOD SUGAR THREE TIMES A DAY AS INSTRUCTED   glimepiride 4 MG tablet Commonly known as:  AMARYL TAKE ONE-HALF TABLET BY MOUTH IN MORNING AND ONE TABLET IN THE EVENING   Insulin Pen Needle 30G X 8 MM Misc Commonly known as:  NOVOFINE Inject 10 each into the skin as needed. Use one per day to inject victoza   lisinopril 10 MG tablet Commonly known as:  PRINIVIL,ZESTRIL Take 1 tablet (10 mg total) by mouth daily.   loratadine 10 MG tablet Commonly known as:  CLARITIN Take 10 mg by mouth daily.   metFORMIN 1000 MG tablet Commonly known as:  GLUCOPHAGE Take 1 tablet (1,000 mg total) by mouth 2 (two) times daily.   mometasone 50 MCG/ACT nasal spray Commonly known as:  NASONEX SPRAY TWO SPRAYS IN EACH NOSTRIL ONCE A DAY   omeprazole 20 MG capsule Commonly known as:  PRILOSEC Take 20 mg by mouth daily.   SAXENDA 18 MG/3ML Sopn Generic drug:  Liraglutide -Weight Management Inject 3 mg into the skin  daily.   simvastatin 40 MG tablet Commonly known as:  ZOCOR Take 1 tablet (40 mg total) by mouth daily.       Allergies: No Known Allergies  Past Medical History:  Diagnosis Date  . Asthma   . Hyperlipidemia   . Hypertension   . Sleep apnea     No past surgical history on file.  Family History  Problem Relation Age of Onset  . Cancer Mother   . Hypertension Mother   . Cancer Father   . Hypertension Father   . Hypertension Brother   . Diabetes Brother   . Diabetes Maternal Grandmother     Social History:  reports that he has never smoked. He has never used smokeless tobacco. He reports that he does not drink alcohol or use drugs.  Review of Systems:   HYPERTENSION:   Is well-controlled with lisinopril, on 10mg   HYPERLIPIDEMIA:   Has good control with simvastatin, triglycerides also normal  Lab Results  Component Value Date   CHOL 136 04/25/2016   HDL 43.60  04/25/2016   LDLCALC 70 04/25/2016   LDLDIRECT 91.1 05/27/2014   TRIG 112.0 04/25/2016   CHOLHDL 3 04/25/2016      Diabetic foot exam in 10/17 shows normal monofilament sensation in the toes and plantar surfaces, no skin lesions or ulcers on the feet and  normal pedal pulses    Eye exam 7/17   Examination:   BP 140/80   Pulse 84   Ht 6\' 1"  (1.854 m)   Wt (!) 312 lb 12.8 oz (141.9 kg)   SpO2 97%   BMI 41.27 kg/m   Body mass index is 41.27 kg/m.      ASSESSMENT/ PLAN:  Diabetes type 2 With morbid obesity  See history of present illness for detailed discussion of current diabetes management, blood sugar patterns and problems identified  His blood sugars are still not optimally controlled with A1c 7.4 This is despite improving his fasting blood sugars with moving all his metformin to the evening However he is taking no Amaryl at dinnertime which he was doing with 4 mg before Most likely is getting postprandial hyperglycemia at least at suppertime This is despite his losing weight with continuing Saxenda  Recommendations:   He can try taking Amaryl as before with half tablet in the morning and 1 tablet at dinnertime  If he is having lower sugars with 4 mg in the evening he can cut back the evening dose to half tablet  He needs to start walking  More consistent monitoring after meals especially after supper  Consider Cycloset if blood sugars are high after meals consistently  Also discussed possibility of mealtime insulin at suppertime  Follow-up in 3 months again with repeat A1c      Zaharah Amir 11/13/2016, 3:44 PM

## 2016-11-27 ENCOUNTER — Telehealth: Payer: Self-pay | Admitting: Endocrinology

## 2016-11-27 NOTE — Telephone Encounter (Signed)
Darryl BeatAlan Hunt Self [phone]  [facility] [place], [place]  simvastatin (ZOCOR) 40 MG tablet   [name] called to say that his mail-in pharmacy is having an issue sending out his prescription for his simvastatin and will authorize him to get a 1 time 30 day supply from his local [facility] pharmacy, so they have time to complete his prescription and get it to him. [name] is completely out of his medicine.

## 2016-11-28 MED ORDER — SIMVASTATIN 40 MG PO TABS: 40.0000 mg | ORAL_TABLET | Freq: Every day | ORAL | 0 refills | Status: DC

## 2016-11-28 NOTE — Telephone Encounter (Signed)
Refill submitted or a 30 day supply to [facility].

## 2017-01-06 ENCOUNTER — Other Ambulatory Visit: Payer: Self-pay | Admitting: Endocrinology

## 2017-01-20 ENCOUNTER — Other Ambulatory Visit: Payer: Self-pay | Admitting: Endocrinology

## 2017-01-24 ENCOUNTER — Other Ambulatory Visit: Payer: Self-pay | Admitting: Endocrinology

## 2017-02-13 ENCOUNTER — Ambulatory Visit: Payer: BLUE CROSS/BLUE SHIELD | Admitting: Endocrinology

## 2017-02-14 ENCOUNTER — Ambulatory Visit (INDEPENDENT_AMBULATORY_CARE_PROVIDER_SITE_OTHER): Payer: BLUE CROSS/BLUE SHIELD | Admitting: Endocrinology

## 2017-02-14 ENCOUNTER — Other Ambulatory Visit (INDEPENDENT_AMBULATORY_CARE_PROVIDER_SITE_OTHER): Payer: BLUE CROSS/BLUE SHIELD

## 2017-02-14 ENCOUNTER — Encounter: Payer: Self-pay | Admitting: Endocrinology

## 2017-02-14 VITALS — BP 134/72 | HR 72 | Ht 73.0 in | Wt 315.2 lb

## 2017-02-14 DIAGNOSIS — E1165 Type 2 diabetes mellitus with hyperglycemia: Secondary | ICD-10-CM | POA: Diagnosis not present

## 2017-02-14 DIAGNOSIS — E782 Mixed hyperlipidemia: Secondary | ICD-10-CM

## 2017-02-14 LAB — COMPREHENSIVE METABOLIC PANEL
ALBUMIN: 4.2 g/dL (ref 3.5–5.2)
ALK PHOS: 69 U/L (ref 39–117)
ALT: 27 U/L (ref 0–53)
AST: 27 U/L (ref 0–37)
BUN: 13 mg/dL (ref 6–23)
CALCIUM: 9 mg/dL (ref 8.4–10.5)
CHLORIDE: 103 meq/L (ref 96–112)
CO2: 26 mEq/L (ref 19–32)
Creatinine, Ser: 0.79 mg/dL (ref 0.40–1.50)
GFR: 107.24 mL/min (ref >60.00)
Glucose, Bld: 163 mg/dL — ABNORMAL HIGH (ref 70–99)
POTASSIUM: 3.9 meq/L (ref 3.5–5.1)
SODIUM: 137 meq/L (ref 135–145)
Total Bilirubin: 1.8 mg/dL — ABNORMAL HIGH (ref 0.2–1.2)
Total Protein: 7 g/dL (ref 6.0–8.3)

## 2017-02-14 LAB — HEMOGLOBIN A1C: Hgb A1c MFr Bld: 6.6 % — ABNORMAL HIGH (ref 4.6–6.5)

## 2017-02-14 NOTE — Patient Instructions (Signed)
More sugars at bedtime  Walk daily 

## 2017-02-14 NOTE — Progress Notes (Signed)
Patient ID: Darryl Hunt, male   DOB: [birth year], 56 y.o.   MRN: [id number]    Reason for Appointment: follow-up   History of Present Illness   Diagnosis: Type 2 DIABETES MELITUS, date of diagnosis: 2010        PREVIOUS history: He has been treated previously with regimen of metformin, glipizide and subsequently Victoza Overall has had fairly good control and glipizide was stopped because of tendency to hypoglycemia His A1c in the past has ranged from 6.7-7%, was 7% in 4/14 He was started on Invokana in 8/14; with this he started losing weight and blood sugars were consistently improved Because of recurrent balanitis he stopped this on his visit in 8/16  RECENT history:   He is currently on a regimen of Saxenda 3.0 mg, Amaryl 2 mg a.m. and 4mg  p.m. and metformin 2 g q a day  A1c has been consistently above 7% on the last 3 visits  Current management, blood sugar patterns and problems identified:  He appears to have better fasting readings now with using 4 mg Amaryl in the evening  Also Amaryl was added at a 2 mg dose in the morning to control daytime hyperglycemia  He thinks that he is trying to cut back on portions at dinnertime to keep his blood sugars from going up high after dinner  However with his recently used restart [place] sensor he has not checked blood sugars after dinner very much lately  He does have periodic high readings after breakfast or lunch based on what he is eating, does have variable carbohydrate intake  Highest blood sugars were after a late breakfast on 7/30  Although he is trying to be generally active with some walking daily as gained a couple of pounds since last visit  FASTING readings are fairly good and he has no hypoglycemia overnight  Side effects from medications: balanitis from Invokana. Monitors blood glucose:     Glucometer: FreeStyle  Mean values apply above for all meters except median for One Touch  PRE-MEAL Fasting  Lunch Dinner Bedtime Overall  Glucose range:       Mean/median: 120 142    135   POST-MEAL PC Breakfast PC Lunch PC Dinner  Glucose range:     Mean/median: 164  166  151      Physical activity: exercise: walking some daily   Wt Readings from Last 3 Encounters:  02/14/17 (!) 315 lb 3.2 oz (143 kg)  11/13/16 (!) 312 lb 12.8 oz (141.9 kg)  08/16/16 (!) 323 lb (146.5 kg)   Lab Results  Component Value Date   HGBA1C 7.4 (H) 11/08/2016   HGBA1C 7.4 (H) 07/24/2016   HGBA1C 7.5 (H) 04/25/2016   Lab Results  Component Value Date   MICROALBUR 1.4 07/24/2016   LDLCALC 70 04/25/2016   CREATININE 0.77 11/08/2016    OTHER active problems: See review of systems   No visits with results within 1 Week(s) from this visit.  Latest known visit with results is:  Lab on 11/08/2016  Component Date Value Ref Range Status  . Hgb A1c MFr Bld 11/08/2016 7.4* 4.6 - 6.5 % Final   Glycemic Control Guidelines for People with Diabetes:Non Diabetic:  <6%Goal of Therapy: <7%Additional Action Suggested:  >8%   . Sodium 11/08/2016 136  135 - 145 mEq/L Final  . Potassium 11/08/2016 4.0  3.5 - 5.1 mEq/L Final  . Chloride 11/08/2016 101  96 - 112 mEq/L Final  . CO2 11/08/2016 30  19 - 32 mEq/L Final  . Glucose, Bld 11/08/2016 158* 70 - 99 mg/dL Final  . BUN 16/04/9603 11  6 - 23 mg/dL Final  . Creatinine, Ser 11/08/2016 0.77  0.40 - 1.50 mg/dL Final  . Total Bilirubin 11/08/2016 1.5* 0.2 - 1.2 mg/dL Final  . Alkaline Phosphatase 11/08/2016 70  39 - 117 U/L Final  . AST 11/08/2016 31  0 - 37 U/L Final  . ALT 11/08/2016 31  0 - 53 U/L Final  . Total Protein 11/08/2016 6.9  6.0 - 8.3 g/dL Final  . Albumin 54/03/8118 4.1  3.5 - 5.2 g/dL Final  . Calcium 14/78/2956 9.2  8.4 - 10.5 mg/dL Final  . GFR 21/30/8657 110.57  >60.00 mL/min Final    Allergies as of 02/14/2017   No Known Allergies     Medication List       Accurate as of 02/14/17 10:16 AM. Always use your most recent med list.            cetirizine 10 MG tablet Commonly known as:  ZYRTEC Take 10 mg by mouth daily.   etodolac 300 MG capsule Commonly known as:  LODINE Take 1 capsule (300 mg total) by mouth every 8 (eight) hours.   fish oil-omega-3 fatty acids 1000 MG capsule Take 2 g by mouth daily. 2 tablets twice a day   FREESTYLE LIBRE READER Devi 1 Device by Does not apply route as directed.   FREESTYLE LIBRE SENSOR SYSTEM Misc Apply to upper arm and change sensor every 10 days   FREESTYLE LITE test strip Generic drug:  glucose blood USE TO TEST BLOOD SUGAR THREE TIMES A DAY AS INSTRUCTED   glimepiride 4 MG tablet Commonly known as:  AMARYL TAKE ONE-HALF TABLET BY MOUTH IN MORNING AND ONE TABLET IN THE EVENING   Insulin Pen Needle 30G X 8 MM Misc Commonly known as:  NOVOFINE Inject 10 each into the skin as needed. Use one per day to inject victoza   lisinopril 10 MG tablet Commonly known as:  PRINIVIL,ZESTRIL TAKE 1 TABLET BY MOUTH  DAILY   loratadine 10 MG tablet Commonly known as:  CLARITIN Take 10 mg by mouth daily.   metFORMIN 1000 MG tablet Commonly known as:  GLUCOPHAGE TAKE 1 TABLET BY MOUTH TWO  TIMES DAILY   mometasone 50 MCG/ACT nasal spray Commonly known as:  NASONEX SPRAY TWO SPRAYS IN EACH NOSTRIL ONCE A DAY   omeprazole 20 MG capsule Commonly known as:  PRILOSEC Take 20 mg by mouth daily.   SAXENDA 18 MG/3ML Sopn Generic drug:  Liraglutide -Weight Management INJECT 3 MG INTO THE SKIN  DAILY.   simvastatin 40 MG tablet Commonly known as:  ZOCOR Take 1 tablet (40 mg total) by mouth daily.       Allergies: No Known Allergies  Past Medical History:  Diagnosis Date  . Asthma   . Hyperlipidemia   . Hypertension   . Sleep apnea     No past surgical history on file.  Family History  Problem Relation Age of Onset  . Cancer Mother   . Hypertension Mother   . Cancer Father   . Hypertension Father   . Hypertension Brother   . Diabetes Brother   . Diabetes Maternal  Grandmother     Social History:  reports that he has never smoked. He has never used smokeless tobacco. He reports that he does not drink alcohol or use drugs.  Review of Systems:   HYPERTENSION:  Is well-controlled with lisinopril, on 10mg   HYPERLIPIDEMIA:   Has good control with simvastatin, last triglycerides also normal  Lab Results  Component Value Date   CHOL 136 04/25/2016   HDL 43.60 04/25/2016   LDLCALC 70 04/25/2016   LDLDIRECT 91.1 05/27/2014   TRIG 112.0 04/25/2016   CHOLHDL 3 04/25/2016      Diabetic foot exam in 10/17 shows normal monofilament sensation in the toes and plantar surfaces, no skin lesions or ulcers on the feet and normal pedal pulses    Eye exam 7/17   Examination:   BP 134/72   Pulse 72   Ht 6\' 1"  (1.854 m)   Wt (!) 315 lb 3.2 oz (143 kg)   SpO2 97%   BMI 41.59 kg/m   Body mass index is 41.59 kg/m.      ASSESSMENT/ PLAN:  Diabetes type 2 With morbid obesity  See history of present illness for detailed discussion of current diabetes management, blood sugar patterns and problems identified  He still has some tendency to postprandial hyperglycemia but his fasting readings are better with taking 6 mg Amaryl in a day He is still not able to lose weight consistently and can do better with exercise He is cutting back on portions at dinnertime but may not be consistently controlling high glycemic index foods at breakfast or lunch  LIPIDS: Needs follow-up  Recommendations:   Check A1c today  More blood sugar monitoring after evening meal  Increase exercise  Consistently improve diet based on his blood sugar patterns  He can continue to use the freestyle [place] system even though it is relatively expensive, he wants to continue doing this  No change in medications  Follow-up in 3 months again with repeat A1c      Darwyn Ponzo 02/14/2017, 10:16 AM   Addendum: A1c is 6.6, glucose 163 comparable to his freestyle Libre sensor  reading of 161 yesterday afternoon

## 2017-02-15 LAB — LIPID PANEL
CHOL/HDL RATIO: 3
Cholesterol: 122 mg/dL (ref 0–200)
HDL: 41.1 mg/dL (ref >39.00)
LDL CALC: 55 mg/dL (ref 0–99)
NONHDL: 80.71
Triglycerides: 128 mg/dL (ref 0.0–149.0)
VLDL: 25.6 mg/dL (ref 0.0–40.0)

## 2017-02-19 ENCOUNTER — Other Ambulatory Visit: Payer: Self-pay | Admitting: Endocrinology

## 2017-02-21 ENCOUNTER — Other Ambulatory Visit: Payer: Self-pay

## 2017-02-21 MED ORDER — INSULIN PEN NEEDLE 32G X 4 MM MISC: 2 refills | Status: DC

## 2017-02-25 ENCOUNTER — Other Ambulatory Visit: Payer: Self-pay | Admitting: Endocrinology

## 2017-03-04 ENCOUNTER — Other Ambulatory Visit: Payer: Self-pay | Admitting: Endocrinology

## 2017-03-08 ENCOUNTER — Other Ambulatory Visit: Payer: Self-pay | Admitting: Endocrinology

## 2017-03-14 ENCOUNTER — Other Ambulatory Visit: Payer: Self-pay

## 2017-03-14 MED ORDER — SIMVASTATIN 40 MG PO TABS: 40.0000 mg | ORAL_TABLET | Freq: Every day | ORAL | 1 refills | Status: DC

## 2017-05-13 ENCOUNTER — Other Ambulatory Visit: Payer: Self-pay | Admitting: Endocrinology

## 2017-05-13 DIAGNOSIS — E1165 Type 2 diabetes mellitus with hyperglycemia: Secondary | ICD-10-CM

## 2017-05-14 ENCOUNTER — Other Ambulatory Visit (INDEPENDENT_AMBULATORY_CARE_PROVIDER_SITE_OTHER): Payer: BLUE CROSS/BLUE SHIELD

## 2017-05-14 DIAGNOSIS — E1165 Type 2 diabetes mellitus with hyperglycemia: Secondary | ICD-10-CM | POA: Diagnosis not present

## 2017-05-14 LAB — HEMOGLOBIN A1C: Hgb A1c MFr Bld: 6.4 % (ref 4.6–6.5)

## 2017-05-14 LAB — COMPREHENSIVE METABOLIC PANEL
ALT: 27 U/L (ref 0–53)
AST: 28 U/L (ref 0–37)
Albumin: 4 g/dL (ref 3.5–5.2)
Alkaline Phosphatase: 66 U/L (ref 39–117)
BUN: 10 mg/dL (ref 6–23)
CALCIUM: 9.2 mg/dL (ref 8.4–10.5)
CHLORIDE: 105 meq/L (ref 96–112)
CO2: 27 meq/L (ref 19–32)
CREATININE: 0.75 mg/dL (ref 0.40–1.50)
GFR: 113.77 mL/min (ref >60.00)
GLUCOSE: 106 mg/dL — AB (ref 70–99)
Potassium: 4 mEq/L (ref 3.5–5.1)
Sodium: 138 mEq/L (ref 135–145)
Total Bilirubin: 1.4 mg/dL — ABNORMAL HIGH (ref 0.2–1.2)
Total Protein: 6.8 g/dL (ref 6.0–8.3)

## 2017-05-14 LAB — MICROALBUMIN / CREATININE URINE RATIO
CREATININE, U: 93.1 mg/dL
Microalb Creat Ratio: 0.9 mg/g (ref 0.0–30.0)
Microalb, Ur: 0.8 mg/dL (ref 0.0–1.9)

## 2017-05-17 ENCOUNTER — Ambulatory Visit: Payer: BLUE CROSS/BLUE SHIELD | Admitting: Endocrinology

## 2017-05-17 ENCOUNTER — Encounter: Payer: Self-pay | Admitting: Endocrinology

## 2017-05-17 VITALS — BP 126/72 | HR 77 | Ht 73.0 in | Wt 316.0 lb

## 2017-05-17 DIAGNOSIS — E1165 Type 2 diabetes mellitus with hyperglycemia: Secondary | ICD-10-CM

## 2017-05-17 MED ORDER — GLIMEPIRIDE 4 MG PO TABS: ORAL_TABLET | ORAL | 5 refills | Status: DC

## 2017-05-17 NOTE — Patient Instructions (Signed)
Glimeperide 1/2 in pm  Check with fingerstick at times

## 2017-05-17 NOTE — Progress Notes (Signed)
Patient ID: Darryl Hunt, male   DOB: [birth year], 56 y.o.   MRN: [id number]    Reason for Appointment: follow-up   History of Present Illness   Diagnosis: Type 2 DIABETES MELITUS, date of diagnosis: 2010        PREVIOUS history: He has been treated previously with regimen of metformin, glipizide and subsequently Victoza Overall has had fairly good control and glipizide was stopped because of tendency to hypoglycemia His A1c in the past has ranged from 6.7-7%, was 7% in 4/14 He was started on Invokana in 8/14; with this he started losing weight and blood sugars were consistently improved Because of recurrent balanitis he stopped this on his visit in 8/16  RECENT history:   He is currently on a regimen of Saxenda 3.0 mg in am , Amaryl 2 mg a.m. and 4mg  p.m. and metformin 2 g q a day  A1c has been better more recently and now down to 6.4  Current management, blood sugar patterns and problems identified:  He has continued to use the freestyle Libre sensor for monitoring  However his blood sugars appear to be much lower on this visit especially overnight  Even with blood sugars in the low 60s he does not have any symptoms and not clear if his freestyle [name] is accurate  However on the day of his lab work is blood sugar was in the same range as indicated by his freestyle Libre  His blood sugars are not fluctuating excessively  He is generally appearing to have better postprandial readings compared to his last visit  However he is not checking his blood sugar after evening meal very much and difficult to know if he has high postprandial readings at times  Otherwise only rarely has blood sugars around her and 80 after either lunch or evening meal  Recently his sensor average indicates that his sugars are ranging between 93 and 124 on any given day  He says he is physically active at work and is trying to walk more in the parking lot but does not have the time or  energy to do any formal exercise otherwise  Side effects from medications: balanitis from Invokana. Monitors blood glucose:     Glucometer: FreeStyle Libre  Mean values apply above for all meters except median for One Touch  PRE-MEAL Fasting Lunch Dinner Bedtime Overall  Glucose range:  52-118       Mean/median: 101 127  113  102  106    Physical activity: exercise: walking some daily   Wt Readings from Last 3 Encounters:  05/17/17 (!) 316 lb (143.3 kg)  02/14/17 (!) 315 lb 3.2 oz (143 kg)  11/13/16 (!) 312 lb 12.8 oz (141.9 kg)   Lab Results  Component Value Date   HGBA1C 6.4 05/14/2017   HGBA1C 6.6 (H) 02/14/2017   HGBA1C 7.4 (H) 11/08/2016   Lab Results  Component Value Date   MICROALBUR 0.8 05/14/2017   LDLCALC 55 02/14/2017   CREATININE 0.75 05/14/2017    OTHER active problems: See review of systems   Lab on 05/14/2017  Component Date Value Ref Range Status  . Sodium 05/14/2017 138  135 - 145 mEq/L Final  . Potassium 05/14/2017 4.0  3.5 - 5.1 mEq/L Final  . Chloride 05/14/2017 105  96 - 112 mEq/L Final  . CO2 05/14/2017 27  19 - 32 mEq/L Final  . Glucose, Bld 05/14/2017 106* 70 - 99 mg/dL Final  . BUN 30/86/5784 10  6 - 23 mg/dL Final  . Creatinine, Ser 05/14/2017 0.75  0.40 - 1.50 mg/dL Final  . Total Bilirubin 05/14/2017 1.4* 0.2 - 1.2 mg/dL Final  . Alkaline Phosphatase 05/14/2017 66  39 - 117 U/L Final  . AST 05/14/2017 28  0 - 37 U/L Final  . ALT 05/14/2017 27  0 - 53 U/L Final  . Total Protein 05/14/2017 6.8  6.0 - 8.3 g/dL Final  . Albumin 08/65/784611/12/2016 4.0  3.5 - 5.2 g/dL Final  . Calcium 96/29/528411/12/2016 9.2  8.4 - 10.5 mg/dL Final  . GFR 13/24/401011/12/2016 113.77  >60.00 mL/min Final  . Hgb A1c MFr Bld 05/14/2017 6.4  4.6 - 6.5 % Final   Glycemic Control Guidelines for People with Diabetes:Non Diabetic:  <6%Goal of Therapy: <7%Additional Action Suggested:  >8%   . Microalb, Ur 05/14/2017 0.8  0.0 - 1.9 mg/dL Final  . Creatinine,U 27/25/366411/12/2016 93.1  mg/dL Final    . Microalb Creat Ratio 05/14/2017 0.9  0.0 - 30.0 mg/g Final    Allergies as of 05/17/2017   No Known Allergies     Medication List        Accurate as of 05/17/17  2:44 PM. Always use your most recent med list.          cetirizine 10 MG tablet Commonly known as:  ZYRTEC Take 10 mg by mouth daily.   etodolac 300 MG capsule Commonly known as:  LODINE TAKE 1 CAPSULE BY MOUTH  EVERY 8 HOURS   fish oil-omega-3 fatty acids 1000 MG capsule Take 2 g by mouth daily. 2 tablets twice a day   FREESTYLE LIBRE READER Devi 1 Device by Does not apply route as directed.   FREESTYLE LIBRE SENSOR SYSTEM Misc APPLY SENSOR TO UPPER ARM - REMOVE AND REPLACE EVERY 10 DAYS AS DIRECTED   FREESTYLE LITE test strip Generic drug:  glucose blood USE TO TEST BLOOD SUGAR THREE TIMES A DAY AS INSTRUCTED   glimepiride 4 MG tablet Commonly known as:  AMARYL TAKE ONE-HALF TABLET BY MOUTH IN MORNING AND ONE TABLET IN THE EVENING   Insulin Pen Needle 32G X 4 MM Misc Use 1 needle daily.   lisinopril 10 MG tablet Commonly known as:  PRINIVIL,ZESTRIL TAKE 1 TABLET BY MOUTH  DAILY   loratadine 10 MG tablet Commonly known as:  CLARITIN Take 10 mg by mouth daily.   metFORMIN 1000 MG tablet Commonly known as:  GLUCOPHAGE TAKE 1 TABLET BY MOUTH TWO  TIMES DAILY   mometasone 50 MCG/ACT nasal spray Commonly known as:  NASONEX SPRAY TWO SPRAYS IN EACH NOSTRIL ONCE A DAY   omeprazole 20 MG capsule Commonly known as:  PRILOSEC Take 20 mg by mouth daily.   SAXENDA 18 MG/3ML Sopn Generic drug:  Liraglutide -Weight Management INJECT 3 MG INTO THE SKIN  DAILY.   simvastatin 40 MG tablet Commonly known as:  ZOCOR Take 1 tablet (40 mg total) by mouth daily.       Allergies: No Known Allergies  Past Medical History:  Diagnosis Date  . Asthma   . Hyperlipidemia   . Hypertension   . Sleep apnea     No past surgical history on file.  Family History  Problem Relation Age of Onset  . Cancer  Mother   . Hypertension Mother   . Cancer Father   . Hypertension Father   . Hypertension Brother   . Diabetes Brother   . Diabetes Maternal Grandmother     Social History:  reports that  has never smoked. he has never used smokeless tobacco. He reports that he does not drink alcohol or use drugs.  Review of Systems:   HYPERTENSION:   Is well-controlled with lisinopril, on 10mg   HYPERLIPIDEMIA:   Has good control with simvastatin, last triglycerides also normal  Lab Results  Component Value Date   CHOL 122 02/14/2017   HDL 41.10 02/14/2017   LDLCALC 55 02/14/2017   LDLDIRECT 91.1 05/27/2014   TRIG 128.0 02/14/2017   CHOLHDL 3 02/14/2017      Diabetic foot exam in 11/18 shows normal monofilament sensation in the toes and plantar surfaces, no skin lesions or ulcers on the feet and normal pedal pulses    Eye exam 7/17   Examination:   BP 126/72   Pulse 77   Ht 6\' 1"  (1.854 m)   Wt (!) 316 lb (143.3 kg)   SpO2 97%   BMI 41.69 kg/m   Body mass index is 41.69 kg/m.     Diabetic Foot Exam - Simple   Simple Foot Form Diabetic Foot exam was performed with the following findings:  Yes 05/17/2017 10:39 AM  Visual Inspection No deformities, no ulcerations, no other skin breakdown bilaterally:  Yes Sensation Testing Intact to touch and monofilament testing bilaterally:  Yes Pulse Check Posterior Tibialis and Dorsalis pulse intact bilaterally:  Yes See comments:  Yes Comments Dorsalis pedis pulses not palpable      ASSESSMENT/ PLAN:  Diabetes type 2 With morbid obesity  See history of present illness for detailed discussion of current diabetes management, blood sugar patterns and problems identified  His blood sugars are excellent now with A1c 6.4 Also his blood sugars are more consistently controlled compared to his last visit including postprandially More recently starting get mild hypoglycemia early morning although not symptomatic Currently on 6 mg Amaryl  at a including 4 mg at dinnertime  Despite his being fairly active is unable to lose weight Generally watching portions especially with the help of Saxenda  Mild hypertension: Well controlled  Recommendations:   Reduce evening Amaryl to 2 mg  More blood sugar monitoring after evening meal or at bedtime  He will call if he has any further tendency to hypoglycemia  Consistent diet      Darryl Hunt 05/17/2017, 2:44 PM

## 2017-06-04 ENCOUNTER — Telehealth: Payer: Self-pay | Admitting: Endocrinology

## 2017-06-04 NOTE — Telephone Encounter (Signed)
Patient need a new prescription for Continuous Blood Gluc Sensor (FREESTYLE LIBRE SENSOR SYSTEM) MISC [[id number]]     [phone] [[id number]][place] [address]

## 2017-06-05 ENCOUNTER — Other Ambulatory Visit: Payer: Self-pay

## 2017-06-05 MED ORDER — FREESTYLE LIBRE SENSOR SYSTEM MISC: 2 refills | Status: DC

## 2017-06-05 NOTE — Telephone Encounter (Signed)
[facility]

## 2017-06-05 NOTE — Telephone Encounter (Signed)
Called patient and let him know that I have [facility].

## 2017-07-10 ENCOUNTER — Other Ambulatory Visit: Payer: Self-pay | Admitting: Endocrinology

## 2017-08-19 ENCOUNTER — Other Ambulatory Visit: Payer: Self-pay | Admitting: Endocrinology

## 2017-08-27 ENCOUNTER — Telehealth: Payer: Self-pay

## 2017-08-27 NOTE — Telephone Encounter (Signed)
LMTCB

## 2017-08-27 NOTE — Telephone Encounter (Signed)
Patient is asking for Victoza Rx to be sent to mail order company.

## 2017-08-27 NOTE — Telephone Encounter (Signed)
PA for Saxenda was denied, it was denied because at the previous time frame of pt being on the medication previously he did not lose 5% of his baseline body weight. A peer to peer review can be done by the provider at [phone].

## 2017-08-27 NOTE — Telephone Encounter (Signed)
He will need to go back to Victoza 1.8 mg daily, 3 pens per month

## 2017-08-28 MED ORDER — LIRAGLUTIDE 18 MG/3ML ~~LOC~~ SOPN: 1.8000 mg | PEN_INJECTOR | Freq: Every day | SUBCUTANEOUS | 1 refills | Status: DC

## 2017-08-28 NOTE — Addendum Note (Signed)
Addended by: Yolande JollyLAWSON, Aftyn Nott on: 08/28/2017 08:25 AM   Modules accepted: Orders

## 2017-08-28 NOTE — Telephone Encounter (Signed)
[facility]

## 2017-09-06 ENCOUNTER — Other Ambulatory Visit (INDEPENDENT_AMBULATORY_CARE_PROVIDER_SITE_OTHER): Payer: BLUE CROSS/BLUE SHIELD

## 2017-09-06 DIAGNOSIS — E1165 Type 2 diabetes mellitus with hyperglycemia: Secondary | ICD-10-CM | POA: Diagnosis not present

## 2017-09-06 LAB — COMPREHENSIVE METABOLIC PANEL
ALBUMIN: 3.8 g/dL (ref 3.5–5.2)
ALT: 31 U/L (ref 0–53)
AST: 25 U/L (ref 0–37)
Alkaline Phosphatase: 69 U/L (ref 39–117)
BUN: 12 mg/dL (ref 6–23)
CALCIUM: 9.2 mg/dL (ref 8.4–10.5)
CHLORIDE: 104 meq/L (ref 96–112)
CO2: 27 mEq/L (ref 19–32)
Creatinine, Ser: 0.68 mg/dL (ref 0.40–1.50)
GFR: 127.25 mL/min (ref >60.00)
Glucose, Bld: 137 mg/dL — ABNORMAL HIGH (ref 70–99)
POTASSIUM: 3.8 meq/L (ref 3.5–5.1)
Sodium: 138 mEq/L (ref 135–145)
Total Bilirubin: 1.6 mg/dL — ABNORMAL HIGH (ref 0.2–1.2)
Total Protein: 7.1 g/dL (ref 6.0–8.3)

## 2017-09-06 LAB — TSH: TSH: 1.31 u[IU]/mL (ref 0.35–4.50)

## 2017-09-06 LAB — MICROALBUMIN / CREATININE URINE RATIO
Creatinine,U: 159 mg/dL
MICROALB UR: 2.1 mg/dL — AB (ref 0.0–1.9)
Microalb Creat Ratio: 1.3 mg/g (ref 0.0–30.0)

## 2017-09-06 LAB — HEMOGLOBIN A1C: HEMOGLOBIN A1C: 6.6 % — AB (ref 4.6–6.5)

## 2017-09-10 ENCOUNTER — Other Ambulatory Visit: Payer: BLUE CROSS/BLUE SHIELD

## 2017-09-11 ENCOUNTER — Encounter: Payer: Self-pay | Admitting: Endocrinology

## 2017-09-11 ENCOUNTER — Ambulatory Visit: Payer: BLUE CROSS/BLUE SHIELD | Admitting: Endocrinology

## 2017-09-11 VITALS — BP 126/70 | HR 82 | Ht 73.0 in | Wt 316.0 lb

## 2017-09-11 DIAGNOSIS — E1165 Type 2 diabetes mellitus with hyperglycemia: Secondary | ICD-10-CM

## 2017-09-11 NOTE — Patient Instructions (Addendum)
More readings after meal  Need eye exam

## 2017-09-11 NOTE — Progress Notes (Signed)
Patient ID: Darryl Hunt, male   DOB: January 24, 1961, 57 y.o.   MRN: [id number]    Reason for Appointment: follow-up   History of Present Illness   Diagnosis: Type 2 DIABETES MELITUS, date of diagnosis: 2010        PREVIOUS history: He has been treated previously with regimen of metformin, glipizide and subsequently Victoza Overall has had fairly good control and glipizide was stopped because of tendency to hypoglycemia His A1c in the past has ranged from 6.7-7%, was 7% in 4/14 He was started on Invokana in 8/14; with this he started losing weight and blood sugars were consistently improved Because of recurrent balanitis he stopped this on his visit in 8/16  RECENT history:   He is currently on a regimen of Saxenda 3.0 mg in am , Amaryl 2 mg a.m. and 2 mg p.m. and metformin 2 g q a day  A1c has been consistently 7% It is now 6.6  Current management, blood sugar patterns and problems identified:  Because of his tendency to early morning hypoglycemia he was told to take only half a tablet of his 4 mg Amaryl in the evening  With this he has not had any low sugars, in fact recently fasting readings appear to be nearly 140  However he has not checked his blood sugars very much for various reasons  His A1c does not indicate significant change, previously 6.4  He has had multiple sinus infections this winter and has not been as consistently active  Overall he thinks he is watching his total caloric intake and carbohydrates about the same  Side effects from medications: balanitis from Invokana. Monitors blood glucose:     Glucometer: FreeStyle Libre  AVERAGE 144 However he has used this only 35% of the time in the last 2 weeks  Every 2-hour average on current recording ranges between 128 up to 166 with highest reading before lunch and after dinner   Physical activity: exercise: walking some daily when able to   Wt Readings from Last 3 Encounters:  09/11/17 (!)  316 lb (143.3 kg)  05/17/17 (!) 316 lb (143.3 kg)  02/14/17 (!) 315 lb 3.2 oz (143 kg)   Lab Results  Component Value Date   HGBA1C 6.6 (H) 09/06/2017   HGBA1C 6.4 05/14/2017   HGBA1C 6.6 (H) 02/14/2017   Lab Results  Component Value Date   MICROALBUR 2.1 (H) 09/06/2017   LDLCALC 55 02/14/2017   CREATININE 0.68 09/06/2017    OTHER active problems: See review of systems   Lab on 09/06/2017  Component Date Value Ref Range Status  . TSH 09/06/2017 1.31  0.35 - 4.50 uIU/mL Final  . Microalb, Ur 09/06/2017 2.1* 0.0 - 1.9 mg/dL Final  . Creatinine,U 40/98/1191 159.0  mg/dL Final  . Microalb Creat Ratio 09/06/2017 1.3  0.0 - 30.0 mg/g Final  . Sodium 09/06/2017 138  135 - 145 mEq/L Final  . Potassium 09/06/2017 3.8  3.5 - 5.1 mEq/L Final  . Chloride 09/06/2017 104  96 - 112 mEq/L Final  . CO2 09/06/2017 27  19 - 32 mEq/L Final  . Glucose, Bld 09/06/2017 137* 70 - 99 mg/dL Final  . BUN 47/82/9562 12  6 - 23 mg/dL Final  . Creatinine, Ser 09/06/2017 0.68  0.40 - 1.50 mg/dL Final  . Total Bilirubin 09/06/2017 1.6* 0.2 - 1.2 mg/dL Final  . Alkaline Phosphatase 09/06/2017 69  39 - 117 U/L Final  . AST 09/06/2017 25  0 -  37 U/L Final  . ALT 09/06/2017 31  0 - 53 U/L Final  . Total Protein 09/06/2017 7.1  6.0 - 8.3 g/dL Final  . Albumin 16/04/9603 3.8  3.5 - 5.2 g/dL Final  . Calcium 54/03/8118 9.2  8.4 - 10.5 mg/dL Final  . GFR 14/78/2956 127.25  >60.00 mL/min Final  . Hgb A1c MFr Bld 09/06/2017 6.6* 4.6 - 6.5 % Final   Glycemic Control Guidelines for People with Diabetes:Non Diabetic:  <6%Goal of Therapy: <7%Additional Action Suggested:  >8%     Allergies as of 09/11/2017   No Known Allergies     Medication List        Accurate as of 09/11/17 10:00 AM. Always use your most recent med list.          cetirizine 10 MG tablet Commonly known as:  ZYRTEC Take 10 mg by mouth daily.   etodolac 300 MG capsule Commonly known as:  LODINE TAKE 1 CAPSULE BY MOUTH  EVERY 8 HOURS     etodolac 300 MG capsule Commonly known as:  LODINE TAKE 1 CAPSULE BY MOUTH  EVERY 8 HOURS   fish oil-omega-3 fatty acids 1000 MG capsule Take 2 g by mouth daily. 2 tablets twice a day   FREESTYLE LIBRE READER Devi 1 Device by Does not apply route as directed.   FREESTYLE LIBRE SENSOR SYSTEM Misc APPLY SENSOR TO UPPER ARM - REMOVE AND REPLACE EVERY 10 DAYS AS DIRECTED   FREESTYLE LITE test strip Generic drug:  glucose blood USE TO TEST BLOOD SUGAR THREE TIMES A DAY AS INSTRUCTED   glimepiride 4 MG tablet Commonly known as:  AMARYL Half tablet twice a day   Insulin Pen Needle 32G X 4 MM Misc Use 1 needle daily.   liraglutide 18 MG/3ML Sopn Commonly known as:  VICTOZA Inject 0.3 mLs (1.8 mg total) into the skin daily at 12 noon.   lisinopril 10 MG tablet Commonly known as:  PRINIVIL,ZESTRIL TAKE 1 TABLET BY MOUTH  DAILY   loratadine 10 MG tablet Commonly known as:  CLARITIN Take 10 mg by mouth daily.   metFORMIN 1000 MG tablet Commonly known as:  GLUCOPHAGE TAKE 1 TABLET BY MOUTH TWO  TIMES DAILY   mometasone 50 MCG/ACT nasal spray Commonly known as:  NASONEX SPRAY TWO SPRAYS IN EACH NOSTRIL ONCE A DAY   omeprazole 20 MG capsule Commonly known as:  PRILOSEC Take 20 mg by mouth daily.   simvastatin 40 MG tablet Commonly known as:  ZOCOR TAKE 1 TABLET BY MOUTH  DAILY       Allergies: No Known Allergies  Past Medical History:  Diagnosis Date  . Asthma   . Hyperlipidemia   . Hypertension   . Sleep apnea     History reviewed. No pertinent surgical history.  Family History  Problem Relation Age of Onset  . Cancer Mother   . Hypertension Mother   . Cancer Father   . Hypertension Father   . Hypertension Brother   . Diabetes Brother   . Diabetes Maternal Grandmother     Social History:  reports that  has never smoked. he has never used smokeless tobacco. He reports that he does not drink alcohol or use drugs.  Review of Systems:   HYPERTENSION:    Is well-controlled with lisinopril 10mg   HYPERLIPIDEMIA:   Has good control with simvastatin 40 mg, last triglycerides and LDL normal  Lab Results  Component Value Date   CHOL 122 02/14/2017   HDL  41.10 02/14/2017   LDLCALC 55 02/14/2017   LDLDIRECT 91.1 05/27/2014   TRIG 128.0 02/14/2017   CHOLHDL 3 02/14/2017     Lab Results  Component Value Date   ALT 31 09/06/2017    Diabetic foot exam in 11/18 shows normal monofilament sensation in the toes and plantar surfaces, no skin lesions or ulcers on the feet and normal pedal pulses    Eye exam 7/17   Examination:   BP 126/70 (BP Location: Left Arm, Patient Position: Sitting, Cuff Size: Large)   Pulse 82   Ht 6\' 1"  (1.854 m)   Wt (!) 316 lb (143.3 kg)   SpO2 97%   BMI 41.69 kg/m   Body mass index is 41.69 kg/m.      ASSESSMENT/ PLAN:  Diabetes type 2 With morbid obesity  See history of present illness for detailed discussion of current diabetes management, blood sugar patterns and problems identified  His blood sugars are still well controlled with A1c 6.6 It may be slightly higher than before because of previous tendency to hypoglycemia  Weight has been about the same However he should be able to do more exercise compared to the last 3 months  Currently he is on Saxenda but his insurance will refuse to cover this now He can look into starting Ozempic but he wants to verify the coverage with his insurance first Showed him how to use the Ozempic pen device and the dosage titration regimen when he does start Otherwise follow-up in 4 months  Mild hypertension: Well controlled       Reather LittlerAjay Holle Sprick 09/11/2017, 10:00 AM

## 2017-09-13 ENCOUNTER — Ambulatory Visit: Payer: BLUE CROSS/BLUE SHIELD | Admitting: Endocrinology

## 2017-09-15 ENCOUNTER — Other Ambulatory Visit: Payer: Self-pay | Admitting: Endocrinology

## 2017-10-03 ENCOUNTER — Telehealth: Payer: Self-pay | Admitting: Endocrinology

## 2017-10-03 NOTE — Telephone Encounter (Signed)
[facility] free style libra. He is currently on the 10 day sensor and would like 14 day sensor.  [facility]     They will need a new prescription for the sensor and for the reader.     [facility] [address] [facility] [employer] [place], [place] [facility] [address]  [phone]

## 2017-10-07 ENCOUNTER — Other Ambulatory Visit: Payer: Self-pay

## 2017-10-07 MED ORDER — FREESTYLE LIBRE 14 DAY SENSOR MISC: 1.0000 | 2 refills | Status: DC

## 2017-10-07 MED ORDER — FREESTYLE LIBRE 14 DAY READER DEVI: 1.0000 | 1 refills | Status: DC

## 2017-10-07 NOTE — Telephone Encounter (Signed)
This has been done.

## 2017-11-08 ENCOUNTER — Other Ambulatory Visit: Payer: Self-pay | Admitting: Endocrinology

## 2017-11-15 ENCOUNTER — Other Ambulatory Visit: Payer: Self-pay | Admitting: Endocrinology

## 2018-01-08 ENCOUNTER — Other Ambulatory Visit: Payer: Self-pay | Admitting: Internal Medicine

## 2018-01-21 ENCOUNTER — Other Ambulatory Visit (INDEPENDENT_AMBULATORY_CARE_PROVIDER_SITE_OTHER): Payer: BLUE CROSS/BLUE SHIELD

## 2018-01-21 DIAGNOSIS — E1165 Type 2 diabetes mellitus with hyperglycemia: Secondary | ICD-10-CM

## 2018-01-21 LAB — LIPID PANEL
CHOL/HDL RATIO: 3
Cholesterol: 126 mg/dL (ref 0–200)
HDL: 43.1 mg/dL (ref >39.00)
LDL CALC: 68 mg/dL (ref 0–99)
NONHDL: 82.67
TRIGLYCERIDES: 73 mg/dL (ref 0.0–149.0)
VLDL: 14.6 mg/dL (ref 0.0–40.0)

## 2018-01-21 LAB — COMPREHENSIVE METABOLIC PANEL
ALBUMIN: 3.8 g/dL (ref 3.5–5.2)
ALK PHOS: 61 U/L (ref 39–117)
ALT: 27 U/L (ref 0–53)
AST: 26 U/L (ref 0–37)
BILIRUBIN TOTAL: 2.1 mg/dL — AB (ref 0.2–1.2)
BUN: 14 mg/dL (ref 6–23)
CALCIUM: 8.9 mg/dL (ref 8.4–10.5)
CHLORIDE: 103 meq/L (ref 96–112)
CO2: 26 mEq/L (ref 19–32)
CREATININE: 0.72 mg/dL (ref 0.40–1.50)
GFR: 118.97 mL/min (ref >60.00)
Glucose, Bld: 150 mg/dL — ABNORMAL HIGH (ref 70–99)
Potassium: 3.9 mEq/L (ref 3.5–5.1)
Sodium: 136 mEq/L (ref 135–145)
Total Protein: 6.6 g/dL (ref 6.0–8.3)

## 2018-01-21 LAB — HEMOGLOBIN A1C: HEMOGLOBIN A1C: 7.5 % — AB (ref 4.6–6.5)

## 2018-01-24 ENCOUNTER — Ambulatory Visit: Payer: BLUE CROSS/BLUE SHIELD | Admitting: Endocrinology

## 2018-01-24 ENCOUNTER — Encounter: Payer: Self-pay | Admitting: Endocrinology

## 2018-01-24 VITALS — BP 138/70 | HR 72 | Ht 73.0 in | Wt 324.4 lb

## 2018-01-24 DIAGNOSIS — E1165 Type 2 diabetes mellitus with hyperglycemia: Secondary | ICD-10-CM

## 2018-01-24 NOTE — Progress Notes (Signed)
Patient ID: Darryl Hunt, male   DOB: 19-Jan-1961, 57 y.o.   MRN: [id number]    Reason for Appointment: follow-up   History of Present Illness   Diagnosis: Type 2 DIABETES MELITUS, date of diagnosis: 2010        PREVIOUS history: He has been treated previously with regimen of metformin, glipizide and subsequently Victoza Overall has had fairly good control and glipizide was stopped because of tendency to hypoglycemia His A1c in the past has ranged from 6.7-7%, was 7% in 4/14 He was started on Invokana in 8/14; with this he started losing weight and blood sugars were consistently improved Because of recurrent balanitis he stopped this on his visit in 8/16  RECENT history:   He is currently on a regimen of Victoza 3.0 mg in am , Amaryl 2 mg a.m. and 2 mg p.m. and metformin 2 g q a day  A1c has been consistently under 7% and now 7.5  Current management, blood sugar patterns and problems identified:  He says for the last 6 weeks he has been on vacation and has been totally off his diet significant hyperglycemia after meals  Also has gained back significant amount of weight  This is despite his taking 1.8 mg total doses on his Victoza because his insurance does not cover Saxenda  Also has been irregular with his walking routine or any other exercise  Currently 35% of his blood sugars in the last 2 weeks have been over 180 he has been back home after vacation for at least a week  Periodically does have relatively large meals with significant amount of carbohydrate or fat even at breakfast and is not quite motivated to change recently  Currently also fasting blood sugars are averaging over 150  Highest blood sugar may be nearly 350  Recently not checking blood sugar much after supper and did not have much information for postprandial readings at night  Side effects from medications: balanitis from Invokana. Monitors blood glucose:     Glucometer: FreeStyle  Libre   PRE-MEAL Fasting Lunch Dinner Bedtime Overall  Glucose range:       Mean/median:  156  179  174   176   POST-MEAL PC Breakfast PC Lunch PC Dinner  Glucose range:     Mean/median:  217   212    Every 2-hour average on current recording ranges between 128 up to 166 with highest reading before lunch and after dinner  Physical activity: exercise: walking some daily, tries to walk around his building at work a couple of times a day   Wt Readings from Last 3 Encounters:  01/24/18 (!) 324 lb 6.4 oz (147.1 kg)  09/11/17 (!) 316 lb (143.3 kg)  05/17/17 (!) 316 lb (143.3 kg)   Lab Results  Component Value Date   HGBA1C 7.5 (H) 01/21/2018   HGBA1C 6.6 (H) 09/06/2017   HGBA1C 6.4 05/14/2017   Lab Results  Component Value Date   MICROALBUR 2.1 (H) 09/06/2017   LDLCALC 68 01/21/2018   CREATININE 0.72 01/21/2018    OTHER active problems: See review of systems   Lab on 01/21/2018  Component Date Value Ref Range Status  . Cholesterol 01/21/2018 126  0 - 200 mg/dL Final   ATP III Classification       Desirable:  < 200 mg/dL               Borderline High:  200 - 239 mg/dL  High:  > = 240 mg/dL  . Triglycerides 01/21/2018 73.0  0.0 - 149.0 mg/dL Final   Normal:  <956 mg/dLBorderline High:  150 - 199 mg/dL  . HDL 01/21/2018 43.10  >39.00 mg/dL Final  . VLDL 21/30/8657 14.6  0.0 - 40.0 mg/dL Final  . LDL Cholesterol 01/21/2018 68  0 - 99 mg/dL Final  . Total CHOL/HDL Ratio 01/21/2018 3   Final                  Men          Women1/2 Average Risk     3.4          3.3Average Risk          5.0          4.42X Average Risk          9.6          7.13X Average Risk          15.0          11.0                      . NonHDL 01/21/2018 82.67   Final   NOTE:  Non-HDL goal should be 30 mg/dL higher than patient's LDL goal (i.e. LDL goal of < 70 mg/dL, would have non-HDL goal of < 100 mg/dL)  . Sodium 01/21/2018 136  135 - 145 mEq/L Final  . Potassium 01/21/2018 3.9  3.5 - 5.1 mEq/L  Final  . Chloride 01/21/2018 103  96 - 112 mEq/L Final  . CO2 01/21/2018 26  19 - 32 mEq/L Final  . Glucose, Bld 01/21/2018 150* 70 - 99 mg/dL Final  . BUN 84/69/6295 14  6 - 23 mg/dL Final  . Creatinine, Ser 01/21/2018 0.72  0.40 - 1.50 mg/dL Final  . Total Bilirubin 01/21/2018 2.1* 0.2 - 1.2 mg/dL Final  . Alkaline Phosphatase 01/21/2018 61  39 - 117 U/L Final  . AST 01/21/2018 26  0 - 37 U/L Final  . ALT 01/21/2018 27  0 - 53 U/L Final  . Total Protein 01/21/2018 6.6  6.0 - 8.3 g/dL Final  . Albumin 28/41/3244 3.8  3.5 - 5.2 g/dL Final  . Calcium 07/11/7251 8.9  8.4 - 10.5 mg/dL Final  . GFR 66/44/0347 118.97  >60.00 mL/min Final  . Hgb A1c MFr Bld 01/21/2018 7.5* 4.6 - 6.5 % Final   Glycemic Control Guidelines for People with Diabetes:Non Diabetic:  <6%Goal of Therapy: <7%Additional Action Suggested:  >8%     Allergies as of 01/24/2018   No Known Allergies     Medication List        Accurate as of 01/24/18  9:08 AM. Always use your most recent med list.          cetirizine 10 MG tablet Commonly known as:  ZYRTEC Take 10 mg by mouth daily.   etodolac 300 MG capsule Commonly known as:  LODINE TAKE 1 CAPSULE BY MOUTH  EVERY 8 HOURS   fish oil-omega-3 fatty acids 1000 MG capsule Take 1,400 mg by mouth daily. 2 tablets twice a day   FREESTYLE LIBRE 14 DAY READER Devi 1 Device by Does not apply route as directed.   FREESTYLE LIBRE 14 DAY SENSOR Misc 1 Device by Does not apply route every 14 (fourteen) days.   glimepiride 4 MG tablet Commonly known as:  AMARYL TAKE 1/2 TABLET BY MOUTH TWICE A DAY   Insulin  Pen Needle 32G X 4 MM Misc Use 1 needle daily.   lisinopril 10 MG tablet Commonly known as:  PRINIVIL,ZESTRIL TAKE 1 TABLET BY MOUTH  DAILY   loratadine 10 MG tablet Commonly known as:  CLARITIN Take 10 mg by mouth daily.   metFORMIN 1000 MG tablet Commonly known as:  GLUCOPHAGE TAKE 1 TABLET BY MOUTH TWO  TIMES DAILY   mometasone 50 MCG/ACT nasal  spray Commonly known as:  NASONEX SPRAY TWO SPRAYS IN EACH NOSTRIL ONCE A DAY   omeprazole 20 MG capsule Commonly known as:  PRILOSEC Take 20 mg by mouth daily.   simvastatin 40 MG tablet Commonly known as:  ZOCOR TAKE 1 TABLET BY MOUTH  DAILY   VICTOZA 18 MG/3ML Sopn Generic drug:  liraglutide INJECT SUBCUTANEOUSLY 1.8MG  DAILY AT 12 NOON.       Allergies: No Known Allergies  Past Medical History:  Diagnosis Date  . Asthma   . Hyperlipidemia   . Hypertension   . Sleep apnea     History reviewed. No pertinent surgical history.  Family History  Problem Relation Age of Onset  . Cancer Mother   . Hypertension Mother   . Cancer Father   . Hypertension Father   . Hypertension Brother   . Diabetes Brother   . Diabetes Maternal Grandmother     Social History:  reports that he has never smoked. He has never used smokeless tobacco. He reports that he does not drink alcohol or use drugs.  Review of Systems:   HYPERTENSION:   Is well-controlled with lisinopril 10mg   HYPERLIPIDEMIA:   Has good control with simvastatin 40 mg, triglycerides and LDL normal This is despite recent poor diet  Lab Results  Component Value Date   CHOL 126 01/21/2018   HDL 43.10 01/21/2018   LDLCALC 68 01/21/2018   LDLDIRECT 91.1 05/27/2014   TRIG 73.0 01/21/2018   CHOLHDL 3 01/21/2018     Lab Results  Component Value Date   ALT 27 01/21/2018    Diabetic foot exam in 11/18 shows normal monofilament sensation in the toes and plantar surfaces, no skin lesions or ulcers on the feet and normal pedal pulses    Eye exam 7/17   Examination:   BP 138/70 (BP Location: Left Arm, Patient Position: Sitting, Cuff Size: Normal)   Pulse 72   Ht 6\' 1"  (1.854 m)   Wt (!) 324 lb 6.4 oz (147.1 kg)   SpO2 97%   BMI 42.80 kg/m   Body mass index is 42.8 kg/m.      ASSESSMENT/ PLAN:  Diabetes type 2 With morbid obesity  See history of present illness for detailed discussion of current  diabetes management, blood sugar patterns and problems identified  His blood sugars have gone out of control with his being inconsistent with his diet on his vacation  He has a A1c of 7.6 which is about 1% higher than before Also home blood sugars averaging about 30 mg higher than before He does use a freestyle libre but recently not checking blood sugars consistently especially after supper  Discussed that he needs to really be consistent with his diet and cut back on portions, calories and carbohydrates which he is still not doing He needs to restart regular exercise Since Ozempic is not covered he can continue Victoza 1.8 mg daily He needs to check more readings at bedtime to review his blood sugar patterns after supper also No change in medications as yet otherwise  Mild hypertension:  Well controlled  LIPIDS: Well controlled on current labs  Patient Instructions  Check blood sugars on waking up  3/7  Also check blood sugars about 2 hours after a meal and do this after different meals by rotation  Recommended blood sugar levels on waking up is 90-130 and about 2 hours after meal is 130-160  Please bring your blood sugar monitor to each visit, thank you   Walk daily    Reather LittlerAjay Lamaj Metoyer 01/24/2018, 9:08 AM

## 2018-01-24 NOTE — Patient Instructions (Addendum)
Check blood sugars on waking up 3/7   Also check blood sugars about 2 hours after a meal and do this after different meals by rotation  Recommended blood sugar levels on waking up is 90-130 and about 2 hours after meal is 130-160  Please bring your blood sugar monitor to each visit, thank you  Walk daily 

## 2018-03-26 ENCOUNTER — Other Ambulatory Visit: Payer: Self-pay | Admitting: Endocrinology

## 2018-04-09 ENCOUNTER — Other Ambulatory Visit: Payer: Self-pay | Admitting: Endocrinology

## 2018-04-18 ENCOUNTER — Other Ambulatory Visit (INDEPENDENT_AMBULATORY_CARE_PROVIDER_SITE_OTHER): Payer: BLUE CROSS/BLUE SHIELD

## 2018-04-18 DIAGNOSIS — E1165 Type 2 diabetes mellitus with hyperglycemia: Secondary | ICD-10-CM | POA: Diagnosis not present

## 2018-04-18 LAB — COMPREHENSIVE METABOLIC PANEL
ALBUMIN: 3.8 g/dL (ref 3.5–5.2)
ALK PHOS: 55 U/L (ref 39–117)
ALT: 26 U/L (ref 0–53)
AST: 26 U/L (ref 0–37)
BUN: 11 mg/dL (ref 6–23)
CO2: 28 mEq/L (ref 19–32)
Calcium: 8.6 mg/dL (ref 8.4–10.5)
Chloride: 108 mEq/L (ref 96–112)
Creatinine, Ser: 0.72 mg/dL (ref 0.40–1.50)
GFR: 118.88 mL/min (ref >60.00)
GLUCOSE: 90 mg/dL (ref 70–99)
Potassium: 3.6 mEq/L (ref 3.5–5.1)
Sodium: 142 mEq/L (ref 135–145)
TOTAL PROTEIN: 6.6 g/dL (ref 6.0–8.3)
Total Bilirubin: 1.8 mg/dL — ABNORMAL HIGH (ref 0.2–1.2)

## 2018-04-18 LAB — BASIC METABOLIC PANEL
BUN: 11 mg/dL (ref 6–23)
CHLORIDE: 108 meq/L (ref 96–112)
CO2: 28 mEq/L (ref 19–32)
CREATININE: 0.72 mg/dL (ref 0.40–1.50)
Calcium: 8.6 mg/dL (ref 8.4–10.5)
GFR: 118.88 mL/min (ref >60.00)
Glucose, Bld: 90 mg/dL (ref 70–99)
Potassium: 3.6 mEq/L (ref 3.5–5.1)
SODIUM: 142 meq/L (ref 135–145)

## 2018-04-18 LAB — HEMOGLOBIN A1C: HEMOGLOBIN A1C: 7.5 % — AB (ref 4.6–6.5)

## 2018-04-24 ENCOUNTER — Ambulatory Visit: Payer: BLUE CROSS/BLUE SHIELD | Admitting: Endocrinology

## 2018-04-26 ENCOUNTER — Other Ambulatory Visit: Payer: Self-pay | Admitting: Endocrinology

## 2018-05-08 ENCOUNTER — Other Ambulatory Visit: Payer: Self-pay | Admitting: Endocrinology

## 2018-05-22 ENCOUNTER — Encounter: Payer: Self-pay | Admitting: Endocrinology

## 2018-05-22 ENCOUNTER — Ambulatory Visit: Payer: BLUE CROSS/BLUE SHIELD | Admitting: Endocrinology

## 2018-05-22 VITALS — BP 128/80 | HR 64 | Ht 73.0 in | Wt 330.0 lb

## 2018-05-22 DIAGNOSIS — E782 Mixed hyperlipidemia: Secondary | ICD-10-CM | POA: Diagnosis not present

## 2018-05-22 DIAGNOSIS — E1165 Type 2 diabetes mellitus with hyperglycemia: Secondary | ICD-10-CM | POA: Diagnosis not present

## 2018-05-22 MED ORDER — TOPIRAMATE 25 MG PO TABS: 25.0000 mg | ORAL_TABLET | Freq: Every day | ORAL | 2 refills | Status: DC

## 2018-05-22 MED ORDER — PHENTERMINE HCL 15 MG PO CAPS: 15.0000 mg | ORAL_CAPSULE | ORAL | 2 refills | Status: DC

## 2018-05-22 NOTE — Progress Notes (Signed)
Patient ID: Darryl Hunt, male   DOB: 1960/12/14, 57 y.o.   MRN: [id number]    Reason for Appointment: follow-up   History of Present Illness   Diagnosis: Type 2 DIABETES MELITUS, date of diagnosis: 2010        PREVIOUS history: He has been treated previously with regimen of metformin, glipizide and subsequently Victoza Overall has had fairly good control and glipizide was stopped because of tendency to hypoglycemia His A1c in the past has ranged from 6.7-7%, was 7% in 4/14 He was started on Invokana in 8/14; with this he started losing weight and blood sugars were consistently improved Because of recurrent balanitis he stopped this on his visit in 8/16  RECENT history:   He is on a regimen of Victoza 1.8 mg in am , Amaryl 2 mg a.m. and 2 mg p.m. and metformin 2 g q a day  A1c has been consistently under 7% previously but now again high at 7.5  Current management, blood sugar patterns and problems identified:  He had a high A1c last month  He thinks this is from family issues and not being able to plan his meals, eating out more and not exercising  However the last 2 weeks his blood sugars are averaging 149 compared to 176 on his previous visit; however has only used the CGM a irregularly in the last week  No hypoglycemia with lowest reading 69 only once  He is trying to do a little more walking recently at work  Only in the last 2 weeks has tried to plan his meals better  He still has occasional high readings overnight but variably after meals, not checking blood sugars enough in the last 2 weeks especially after supper  His main difficulty is increased appetite and difficulty with portion control for snacks after his evening meal  Not able to get Saxenda approved by his insurance His weight has gone up again  Side effects from medications: balanitis from Invokana.  GLUCOSE MONITORING results:   Glucometer: FreeStyle Libre  CGM use % of time  52    Average and SD  149+/-34  Time in range     81   %  % Time Above 180  19  % Time above 250   % Time Below target 0      PRE-MEAL Fasting Lunch Dinner Bedtime Overall  Glucose range: 103-186      Mean/median:  149  140  130  149   POST-MEAL PC Breakfast PC Lunch PC Dinner  Glucose range:     Mean/median:  162  145  155   PREVIOUS readings:  PRE-MEAL Fasting Lunch Dinner Bedtime Overall  Glucose range:       Mean/median:  156  179  174   176   POST-MEAL PC Breakfast PC Lunch PC Dinner  Glucose range:     Mean/median:  217   212    Physical activity: exercise: walking some daily    Wt Readings from Last 3 Encounters:  05/22/18 (!) 330 lb (149.7 kg)  01/24/18 (!) 324 lb 6.4 oz (147.1 kg)  09/11/17 (!) 316 lb (143.3 kg)   Lab Results  Component Value Date   HGBA1C 7.5 (H) 04/18/2018   HGBA1C 7.5 (H) 01/21/2018   HGBA1C 6.6 (H) 09/06/2017   Lab Results  Component Value Date   MICROALBUR 2.1 (H) 09/06/2017   LDLCALC 68 01/21/2018   CREATININE 0.72 04/18/2018   CREATININE 0.72 04/18/2018  OTHER active problems: See review of systems   No visits with results within 1 Week(s) from this visit.  Latest known visit with results is:  Lab on 04/18/2018  Component Date Value Ref Range Status  . Sodium 04/18/2018 142  135 - 145 mEq/L Final  . Potassium 04/18/2018 3.6  3.5 - 5.1 mEq/L Final  . Chloride 04/18/2018 108  96 - 112 mEq/L Final  . CO2 04/18/2018 28  19 - 32 mEq/L Final  . Glucose, Bld 04/18/2018 90  70 - 99 mg/dL Final  . BUN 16/04/9603 11  6 - 23 mg/dL Final  . Creatinine, Ser 04/18/2018 0.72  0.40 - 1.50 mg/dL Final  . Total Bilirubin 04/18/2018 1.8* 0.2 - 1.2 mg/dL Final  . Alkaline Phosphatase 04/18/2018 55  39 - 117 U/L Final  . AST 04/18/2018 26  0 - 37 U/L Final  . ALT 04/18/2018 26  0 - 53 U/L Final  . Total Protein 04/18/2018 6.6  6.0 - 8.3 g/dL Final  . Albumin 54/03/8118 3.8  3.5 - 5.2 g/dL Final  . Calcium 14/78/2956 8.6  8.4 - 10.5 mg/dL  Final  . GFR 21/30/8657 118.88  >60.00 mL/min Final  . Sodium 04/18/2018 142  135 - 145 mEq/L Final  . Potassium 04/18/2018 3.6  3.5 - 5.1 mEq/L Final  . Chloride 04/18/2018 108  96 - 112 mEq/L Final  . CO2 04/18/2018 28  19 - 32 mEq/L Final  . Glucose, Bld 04/18/2018 90  70 - 99 mg/dL Final  . BUN 84/69/6295 11  6 - 23 mg/dL Final  . Creatinine, Ser 04/18/2018 0.72  0.40 - 1.50 mg/dL Final  . Calcium 28/41/3244 8.6  8.4 - 10.5 mg/dL Final  . GFR 07/11/7251 118.88  >60.00 mL/min Final  . Hgb A1c MFr Bld 04/18/2018 7.5* 4.6 - 6.5 % Final   Glycemic Control Guidelines for People with Diabetes:Non Diabetic:  <6%Goal of Therapy: <7%Additional Action Suggested:  >8%     Allergies as of 05/22/2018   No Known Allergies     Medication List        Accurate as of 05/22/18 11:32 AM. Always use your most recent med list.          etodolac 300 MG capsule Commonly known as:  LODINE TAKE 1 CAPSULE BY MOUTH  EVERY 8 HOURS   fish oil-omega-3 fatty acids 1000 MG capsule Take 1,400 mg by mouth daily. 2 tablets twice a day   FREESTYLE LIBRE 14 DAY READER Devi 1 Device by Does not apply route as directed.   FREESTYLE LIBRE 14 DAY SENSOR Misc APPLY 1 SENSOR TO UPPER ARM - REMOVE AND REPLACE EVERY 14 DAYS   glimepiride 4 MG tablet Commonly known as:  AMARYL TAKE 1/2 TABLET BY MOUTH TWICE A DAY   Insulin Pen Needle 32G X 4 MM Misc Use 1 needle daily.   levocetirizine 5 MG tablet Commonly known as:  XYZAL Take 5 mg by mouth every evening. TAKE 1 TABLET DAILY.   lisinopril 10 MG tablet Commonly known as:  PRINIVIL,ZESTRIL TAKE 1 TABLET BY MOUTH  DAILY   metFORMIN 1000 MG tablet Commonly known as:  GLUCOPHAGE TAKE 1 TABLET BY MOUTH TWO  TIMES DAILY   mometasone 50 MCG/ACT nasal spray Commonly known as:  NASONEX SPRAY TWO SPRAYS IN EACH NOSTRIL ONCE A DAY   omeprazole 20 MG capsule Commonly known as:  PRILOSEC Take 20 mg by mouth daily.   simvastatin 40 MG tablet Commonly  known  as:  ZOCOR TAKE 1 TABLET BY MOUTH  DAILY   VICTOZA 18 MG/3ML Sopn Generic drug:  liraglutide INJECT SUBCUTANEOUSLY 1.8MG  DAILY AT 12 NOON.       Allergies: No Known Allergies  Past Medical History:  Diagnosis Date  . Asthma   . Hyperlipidemia   . Hypertension   . Sleep apnea     No past surgical history on file.  Family History  Problem Relation Age of Onset  . Cancer Mother   . Hypertension Mother   . Cancer Father   . Hypertension Father   . Hypertension Brother   . Diabetes Brother   . Diabetes Maternal Grandmother     Social History:  reports that he has never smoked. He has never used smokeless tobacco. He reports that he does not drink alcohol or use drugs.  Review of Systems:   HYPERTENSION:   Is well controlled with lisinopril 10mg   HYPERLIPIDEMIA:   Has good control with simvastatin 40 mg, triglycerides and LDL normal   Lab Results  Component Value Date   CHOL 126 01/21/2018   HDL 43.10 01/21/2018   LDLCALC 68 01/21/2018   LDLDIRECT 91.1 05/27/2014   TRIG 73.0 01/21/2018   CHOLHDL 3 01/21/2018     Lab Results  Component Value Date   ALT 26 04/18/2018    Diabetic foot exam in 11/18 shows normal monofilament sensation in the toes and plantar surfaces, no skin lesions or ulcers on the feet and normal pedal pulses  He is asking about trying alpha-tocopherol for his diabetes and agreed this should be okay   Eye exam 7/17   Examination:   BP 128/80   Pulse 64   Ht 6\' 1"  (1.854 m)   Wt (!) 330 lb (149.7 kg)   SpO2 96%   BMI 43.54 kg/m   Body mass index is 43.54 kg/m.      ASSESSMENT/ PLAN:  Diabetes type 2 With morbid obesity  See history of present illness for detailed discussion of current diabetes management, blood sugar patterns and problems identified  His blood sugars have gone out of control with his being inconsistent with his diet on his vacation  He has a A1c of 7.5 again  However his blood sugars are recently  appearing to be somewhat better from his trying to get back onto his diet and being more active  His main difficulty appears to be controlling his diet and progressive weight gain despite recently trying to be a little more active Has not tried any weight loss medications and currently not able to get Saxenda covered by insurance Prescription weight loss medications appear to be requiring prior authorization  Recommendations:  Trial of combination of phentermine and Topamax low doses to start with  Consistent diet  More blood sugar monitoring after supper  Call if blood sugars are not improving  However if blood sugars are low normal at dinnertime may reduce or stop Amaryl in the morning  Mild hypertension: Well controlled   Follow-up in 3 months  Patient Instructions  More sugars at [employer] 05/22/2018, 11:32 AM

## 2018-05-22 NOTE — Patient Instructions (Signed)
More sugars at nite

## 2018-05-28 ENCOUNTER — Other Ambulatory Visit: Payer: Self-pay | Admitting: Endocrinology

## 2018-06-04 ENCOUNTER — Ambulatory Visit: Payer: BLUE CROSS/BLUE SHIELD | Admitting: Endocrinology

## 2018-07-27 ENCOUNTER — Other Ambulatory Visit: Payer: Self-pay | Admitting: Endocrinology

## 2018-08-14 ENCOUNTER — Other Ambulatory Visit: Payer: Self-pay | Admitting: Endocrinology

## 2018-08-19 ENCOUNTER — Other Ambulatory Visit: Payer: Self-pay

## 2018-08-19 ENCOUNTER — Other Ambulatory Visit: Payer: Self-pay | Admitting: Endocrinology

## 2018-08-19 ENCOUNTER — Telehealth: Payer: Self-pay

## 2018-08-19 ENCOUNTER — Other Ambulatory Visit (INDEPENDENT_AMBULATORY_CARE_PROVIDER_SITE_OTHER): Payer: BLUE CROSS/BLUE SHIELD

## 2018-08-19 DIAGNOSIS — E1165 Type 2 diabetes mellitus with hyperglycemia: Secondary | ICD-10-CM

## 2018-08-19 DIAGNOSIS — E782 Mixed hyperlipidemia: Secondary | ICD-10-CM

## 2018-08-19 LAB — LIPID PANEL
CHOLESTEROL: 119 mg/dL (ref 0–200)
HDL: 37.8 mg/dL — AB (ref >39.00)
LDL Cholesterol: 68 mg/dL (ref 0–99)
NONHDL: 81.02
TRIGLYCERIDES: 66 mg/dL (ref 0.0–149.0)
Total CHOL/HDL Ratio: 3
VLDL: 13.2 mg/dL (ref 0.0–40.0)

## 2018-08-19 LAB — MICROALBUMIN / CREATININE URINE RATIO
Creatinine,U: 82.9 mg/dL
Microalb Creat Ratio: 0.9 mg/g (ref 0.0–30.0)
Microalb, Ur: 0.8 mg/dL (ref 0.0–1.9)

## 2018-08-19 LAB — COMPREHENSIVE METABOLIC PANEL
ALT: 24 U/L (ref 0–53)
AST: 23 U/L (ref 0–37)
Albumin: 3.9 g/dL (ref 3.5–5.2)
Alkaline Phosphatase: 68 U/L (ref 39–117)
BUN: 10 mg/dL (ref 6–23)
CO2: 25 mEq/L (ref 19–32)
Calcium: 8.6 mg/dL (ref 8.4–10.5)
Chloride: 104 mEq/L (ref 96–112)
Creatinine, Ser: 0.74 mg/dL (ref 0.40–1.50)
GFR: 108.24 mL/min (ref >60.00)
GLUCOSE: 206 mg/dL — AB (ref 70–99)
Potassium: 4.2 mEq/L (ref 3.5–5.1)
SODIUM: 137 meq/L (ref 135–145)
Total Bilirubin: 1.9 mg/dL — ABNORMAL HIGH (ref 0.2–1.2)
Total Protein: 6.2 g/dL (ref 6.0–8.3)

## 2018-08-19 LAB — HEMOGLOBIN A1C: Hgb A1c MFr Bld: 7.3 % — ABNORMAL HIGH (ref 4.6–6.5)

## 2018-08-19 MED ORDER — PHENTERMINE HCL 15 MG PO CAPS: 15.0000 mg | ORAL_CAPSULE | ORAL | 2 refills | Status: DC

## 2018-08-19 NOTE — Telephone Encounter (Signed)
Yes, Dr. Lucianne Muss would like the pt to know that the medication is only available in a capsule form.

## 2018-08-19 NOTE — Telephone Encounter (Signed)
Pt asked that Dr. Lucianne Muss change his phentermine from a capsule to a tablet because this is less expensive. Pt has been paying out of pocket since his insurance does not cover this medication.  Pt would also like it send to [facility] at [facility], [place], [place]

## 2018-08-19 NOTE — Telephone Encounter (Signed)
Phentermine 15mg  does not come in tablet form, only capsule. The tablet form that is least expensive is a higher dosage of 37.5mg . Please advise

## 2018-08-19 NOTE — Telephone Encounter (Signed)
Please find out if phentermine 15 mg is available in a tablet

## 2018-08-19 NOTE — Telephone Encounter (Signed)
I do not want him to have any higher dose than 15 mg

## 2018-08-19 NOTE — Telephone Encounter (Signed)
Called pt and advised him of this. Pt verbalized understanding and stated that he needs a refill and would like this rx sent to [facility] in [place], [place]. The rx was sent.

## 2018-08-19 NOTE — Telephone Encounter (Signed)
Does that me we need to call pt to let him know that Dr. Lucianne MussKumar is only agreeable to 15mg  CAPSULES?

## 2018-08-20 NOTE — Telephone Encounter (Signed)
Rx would not escribe. Hard script printed out for MD signature and will be faxed.

## 2018-08-27 ENCOUNTER — Other Ambulatory Visit: Payer: Self-pay

## 2018-08-27 ENCOUNTER — Encounter: Payer: Self-pay | Admitting: Endocrinology

## 2018-08-27 ENCOUNTER — Ambulatory Visit: Payer: BLUE CROSS/BLUE SHIELD | Admitting: Endocrinology

## 2018-08-27 VITALS — BP 122/62 | HR 86 | Ht 73.0 in | Wt 317.4 lb

## 2018-08-27 DIAGNOSIS — R29898 Other symptoms and signs involving the musculoskeletal system: Secondary | ICD-10-CM | POA: Diagnosis not present

## 2018-08-27 DIAGNOSIS — E782 Mixed hyperlipidemia: Secondary | ICD-10-CM | POA: Diagnosis not present

## 2018-08-27 DIAGNOSIS — E1165 Type 2 diabetes mellitus with hyperglycemia: Secondary | ICD-10-CM

## 2018-08-27 NOTE — Progress Notes (Signed)
Patient ID: Darryl Hunt, male   DOB: May 21, 1961, 58 y.o.   MRN: [id number]    Reason for Appointment: follow-up   History of Present Illness   Diagnosis: Type 2 DIABETES MELITUS, date of diagnosis: 2010        PREVIOUS history: He has been treated previously with regimen of metformin, glipizide and subsequently Victoza Overall has had fairly good control and glipizide was stopped because of tendency to hypoglycemia His A1c in the past has ranged from 6.7-7%, was 7% in 4/14 He was started on Invokana in 8/14; with this he started losing weight and blood sugars were consistently improved Because of recurrent balanitis he stopped this on his visit in 8/16  RECENT history:   He is on a regimen of Victoza 1.8 mg in am , Amaryl 2 mg a.m. and 2 mg p.m. and metformin 2 g q a day  A1c has been consistently under 7% previously but on the last visit was high at 7.5  Current management, blood sugar patterns and problems identified:  He had a high A1c last visit but it is only slightly better  Although he has lost a significant amount of weight with phentermine and Topamax his A1c is not much better  Most of his high readings may be after dinner or in the mornings from dawn phenomenon as seen on his CGM download below  He is thinks he is trying to walk some more  However does not always plan his meals well, last night had [employer]  Blood sugars may be higher on some mornings also including today, possibly from high-fat meals the night before  He is reporting an episode of low blood sugar of 64 about 1 to 2 months ago but no further episodes and low sugar is about 92 in the last 2 weeks   Side effects from medications: balanitis from Invokana.  GLUCOSE MONITORING results and analysis from freestyle libre:  Time in range is 85% with 15% between 180-250  PRE-MEAL Fasting Lunch Dinner Bedtime Overall  Glucose range:       Mean/median:  143  104  142  151  140    POST-MEAL PC Breakfast PC Lunch PC Dinner  Glucose range:     Mean/median:  153  111  169     Physical activity: exercise: walking some daily    Wt Readings from Last 3 Encounters:  08/27/18 (!) 317 lb 6.4 oz (144 kg)  05/22/18 (!) 330 lb (149.7 kg)  01/24/18 (!) 324 lb 6.4 oz (147.1 kg)   Lab Results  Component Value Date   HGBA1C 7.3 (H) 08/19/2018   HGBA1C 7.5 (H) 04/18/2018   HGBA1C 7.5 (H) 01/21/2018   Lab Results  Component Value Date   MICROALBUR 0.8 08/19/2018   LDLCALC 68 08/19/2018   CREATININE 0.74 08/19/2018    OTHER active problems: See review of systems   No visits with results within 1 Week(s) from this visit.  Latest known visit with results is:  Lab on 08/19/2018  Component Date Value Ref Range Status  . Cholesterol 08/19/2018 119  0 - 200 mg/dL Final   ATP III Classification       Desirable:  < 200 mg/dL               Borderline High:  200 - 239 mg/dL          High:  > = 409 mg/dL  . Triglycerides 08/19/2018 66.0  0.0 -  149.0 mg/dL Final   Normal:  <364 mg/dLBorderline High:  150 - 199 mg/dL  . HDL 08/19/2018 37.80* >39.00 mg/dL Final  . VLDL 68/09/2120 13.2  0.0 - 40.0 mg/dL Final  . LDL Cholesterol 08/19/2018 68  0 - 99 mg/dL Final  . Total CHOL/HDL Ratio 08/19/2018 3   Final                  Men          Women1/2 Average Risk     3.4          3.3Average Risk          5.0          4.42X Average Risk          9.6          7.13X Average Risk          15.0          11.0                      . NonHDL 08/19/2018 81.02   Final   NOTE:  Non-HDL goal should be 30 mg/dL higher than patient's LDL goal (i.e. LDL goal of < 70 mg/dL, would have non-HDL goal of < 100 mg/dL)  . Sodium 08/19/2018 137  135 - 145 mEq/L Final  . Potassium 08/19/2018 4.2  3.5 - 5.1 mEq/L Final  . Chloride 08/19/2018 104  96 - 112 mEq/L Final  . CO2 08/19/2018 25  19 - 32 mEq/L Final  . Glucose, Bld 08/19/2018 206* 70 - 99 mg/dL Final  . BUN 48/25/0037 10  6 - 23 mg/dL Final  .  Creatinine, Ser 08/19/2018 0.74  0.40 - 1.50 mg/dL Final  . Total Bilirubin 08/19/2018 1.9* 0.2 - 1.2 mg/dL Final  . Alkaline Phosphatase 08/19/2018 68  39 - 117 U/L Final  . AST 08/19/2018 23  0 - 37 U/L Final  . ALT 08/19/2018 24  0 - 53 U/L Final  . Total Protein 08/19/2018 6.2  6.0 - 8.3 g/dL Final  . Albumin 04/88/8916 3.9  3.5 - 5.2 g/dL Final  . Calcium 94/50/3888 8.6  8.4 - 10.5 mg/dL Final  . GFR 28/00/3491 108.24  >60.00 mL/min Final  . Hgb A1c MFr Bld 08/19/2018 7.3* 4.6 - 6.5 % Final   Glycemic Control Guidelines for People with Diabetes:Non Diabetic:  <6%Goal of Therapy: <7%Additional Action Suggested:  >8%   . Microalb, Ur 08/19/2018 0.8  0.0 - 1.9 mg/dL Final  . Creatinine,U 79/15/0569 82.9  mg/dL Final  . Microalb Creat Ratio 08/19/2018 0.9  0.0 - 30.0 mg/g Final    Allergies as of 08/27/2018   No Known Allergies     Medication List       Accurate as of August 27, 2018  9:39 AM. Always use your most recent med list.        etodolac 300 MG capsule Commonly known as:  LODINE TAKE 1 CAPSULE BY MOUTH  EVERY 8 HOURS   fish oil-omega-3 fatty acids 1000 MG capsule Take 1,400 mg by mouth daily. 2 tablets twice a day   FREESTYLE LIBRE 14 DAY READER Devi 1 Device by Does not apply route as directed.   FREESTYLE LIBRE 14 DAY SENSOR Misc APPLY 1 SENSOR TO UPPER ARM - REMOVE AND REPLACE EVERY 14 DAYS   glimepiride 4 MG tablet Commonly known as:  AMARYL TAKE 1/2 TABLET BY MOUTH TWICE A DAY  Insulin Pen Needle 32G X 4 MM Misc Use 1 needle daily.   levocetirizine 5 MG tablet Commonly known as:  XYZAL Take 5 mg by mouth every evening. TAKE 1 TABLET DAILY.   lisinopril 10 MG tablet Commonly known as:  PRINIVIL,ZESTRIL TAKE 1 TABLET BY MOUTH  DAILY   metFORMIN 1000 MG tablet Commonly known as:  GLUCOPHAGE TAKE 1 TABLET BY MOUTH TWO  TIMES DAILY   mometasone 50 MCG/ACT nasal spray Commonly known as:  NASONEX SPRAY TWO SPRAYS IN EACH NOSTRIL ONCE A DAY     omeprazole 20 MG capsule Commonly known as:  PRILOSEC Take 20 mg by mouth daily.   phentermine 15 MG capsule Take 1 capsule (15 mg total) by mouth every morning.   simvastatin 40 MG tablet Commonly known as:  ZOCOR TAKE 1 TABLET BY MOUTH  DAILY   topiramate 25 MG tablet Commonly known as:  TOPAMAX TAKE 1 TABLET BY MOUTH AT DINNER TIME FOR 15 DAYS, THEN 2 TABLETS ONCE DAILY   VICTOZA 18 MG/3ML Sopn Generic drug:  liraglutide INJECT SUBCUTANEOUSLY 1.8MG  DAILY AT 12 NOON.       Allergies: No Known Allergies  Past Medical History:  Diagnosis Date  . Asthma   . Hyperlipidemia   . Hypertension   . Sleep apnea     History reviewed. No pertinent surgical history.  Family History  Problem Relation Age of Onset  . Cancer Mother   . Hypertension Mother   . Cancer Father   . Hypertension Father   . Hypertension Brother   . Diabetes Brother   . Diabetes Maternal Grandmother     Social History:  reports that he has never smoked. He has never used smokeless tobacco. He reports that he does not drink alcohol or use drugs.  Review of Systems:   HYPERTENSION:   Is well controlled with lisinopril 10mg   HYPERLIPIDEMIA:   Has good control with simvastatin 40 mg, triglycerides and LDL normal   Lab Results  Component Value Date   CHOL 119 08/19/2018   HDL 37.80 (L) 08/19/2018   LDLCALC 68 08/19/2018   LDLDIRECT 91.1 05/27/2014   TRIG 66.0 08/19/2018   CHOLHDL 3 08/19/2018     Lab Results  Component Value Date   ALT 24 08/19/2018    Diabetic foot exam in 11/18 shows normal monofilament sensation in the toes and plantar surfaces, no skin lesions or ulcers on the feet and normal pedal pulses    He thinks he gets tired with his legs but not necessarily fatigued and is asking about various OTC amino acid and other supplements he can use.  He was told he could use alpha-tocopherol for neuropathy but he does not want to do this Has not had evaluation of his testosterone  in the past   Examination:   BP 122/62 (BP Location: Left Arm, Patient Position: Sitting, Cuff Size: Normal)   Pulse 86   Ht 6\' 1"  (1.854 m)   Wt (!) 317 lb 6.4 oz (144 kg)   SpO2 98%   BMI 41.88 kg/m   Body mass index is 41.88 kg/m.   Repeat pulse 76  ASSESSMENT/ PLAN:  Diabetes type 2 with morbid obesity  See history of present illness for detailed discussion of current diabetes management, blood sugar patterns and problems identified   He has a A1c of 7.3  He is currently on a regimen of Victoza, Amaryl and metformin Intolerant to SGLT2 drugs  Although he is doing well with weight  loss using phentermine and Topamax still has some postprandial hyperglycemia at night and periodically high fasting readings Diet can still be better with lower fat choices and less carbohydrate He is slowly trying to increase exercise but not doing enough as yet  Discussed need for further efforts to improve his diet consistently He will also check readings after dinner more often Discussed blood sugar targets, dawn phenomenon and effect of high fat meals We will keep his diabetes regimen the same for now Continue phentermine and low-dose Topamax May consider switching to Ozempic  Discussed that if he is having heartburn from phentermine he can try taking this at lunchtime instead of breakfast  LIPIDS: Well controlled  Blood pressure is well controlled and he has no microalbuminuria  He has nonspecific fatigability noticed in his legs and not clear of the reason However since he has metabolic syndrome and obesity he will need fasting free testosterone level evaluated and he can get this drawn through his PCP Discussed that taking OTC amino acid supplements has not been to help his legs  Total visit time for evaluation and management of multiple problems and counseling =25 minutes  Follow-up in 3 months  There are no Patient Instructions on file for this visit.   Reather Littler 08/27/2018, 9:39 AM

## 2018-08-27 NOTE — Patient Instructions (Addendum)
MORE SUGARS AFTER SUPPER  LOW fat diet  More walking

## 2018-09-25 LAB — HM DIABETES EYE EXAM

## 2018-10-16 ENCOUNTER — Other Ambulatory Visit: Payer: Self-pay | Admitting: Endocrinology

## 2018-11-02 ENCOUNTER — Other Ambulatory Visit: Payer: Self-pay | Admitting: Endocrinology

## 2018-11-10 ENCOUNTER — Other Ambulatory Visit: Payer: Self-pay | Admitting: Endocrinology

## 2018-11-12 ENCOUNTER — Other Ambulatory Visit: Payer: Self-pay | Admitting: Endocrinology

## 2018-11-20 ENCOUNTER — Other Ambulatory Visit (INDEPENDENT_AMBULATORY_CARE_PROVIDER_SITE_OTHER): Payer: BLUE CROSS/BLUE SHIELD

## 2018-11-20 ENCOUNTER — Other Ambulatory Visit: Payer: Self-pay

## 2018-11-20 DIAGNOSIS — E1165 Type 2 diabetes mellitus with hyperglycemia: Secondary | ICD-10-CM

## 2018-11-20 DIAGNOSIS — R29898 Other symptoms and signs involving the musculoskeletal system: Secondary | ICD-10-CM

## 2018-11-20 LAB — BASIC METABOLIC PANEL
BUN: 14 mg/dL (ref 6–23)
CO2: 25 mEq/L (ref 19–32)
Calcium: 8.8 mg/dL (ref 8.4–10.5)
Chloride: 105 mEq/L (ref 96–112)
Creatinine, Ser: 0.86 mg/dL (ref 0.40–1.50)
GFR: 90.93 mL/min (ref >60.00)
Glucose, Bld: 71 mg/dL (ref 70–99)
Potassium: 3.8 mEq/L (ref 3.5–5.1)
Sodium: 138 mEq/L (ref 135–145)

## 2018-11-20 LAB — HEMOGLOBIN A1C: Hgb A1c MFr Bld: 6.8 % — ABNORMAL HIGH (ref 4.6–6.5)

## 2018-11-21 LAB — LUTEINIZING HORMONE: LH: 7.8 m[IU]/mL (ref 1.7–8.6)

## 2018-11-24 ENCOUNTER — Other Ambulatory Visit: Payer: Self-pay | Admitting: Endocrinology

## 2018-11-24 NOTE — Telephone Encounter (Signed)
Please advise if refill is appropriate 

## 2018-11-24 NOTE — Progress Notes (Signed)
Patient ID: Darryl Hunt, male   DOB: 01/11/1961, 58 y.o.   MRN: [id number]    Reason for Appointment: follow-up   History of Present Illness   Diagnosis: Type 2 DIABETES MELITUS, date of diagnosis: 2010        PREVIOUS history: He has been treated previously with regimen of metformin, glipizide and subsequently Victoza Overall has had fairly good control and glipizide was stopped because of tendency to hypoglycemia His A1c in the past has ranged from 6.7-7%, was 7% in 4/14 He was started on Invokana in 8/14; with this he started losing weight and blood sugars were consistently improved Because of recurrent balanitis he stopped this on his visit in 8/16  RECENT history:   He is on a regimen of Victoza 1.8 mg in am , Amaryl 2 mg acs and 2 mg p.m. and metformin 2 g q a day  A1c has been previously difficult to control and now appears to be improving It is now 6.8 compared to 7.3  Current management, blood sugar patterns and problems identified:  He appears to have much better blood sugar control now compared to his previous 2 visits  This is slightly from him being able to watch his diet better and also recently started to walk more  Also probably not eating out as much  With taking phentermine and Topamax he has decreased appetite for snacks and continues to lose some weight  No side effects from these drugs  However with his freestyle sensor he has not been checking his sugars much at all lately and only some in the morning  Also has incomplete data because of his sensor falling off and he was very active about 8-10 days ago  Although his sensor indicates his blood sugar to be transiently low at times including overnight he has not had any low sugar symptoms  However blood sugar at dinnertime was 71 in the lab  Blood sugars tend to be lower in the afternoon contact in the mornings overall   Side effects from medications: balanitis from Invokana.  GLUCOSE  MONITORING results and analysis from freestyle libre:  He has only data for 25% of the time over the last 2 weeks  AVERAGE 108  Average by time of the day highest 128 between 6-8 AM and lowest 73 between 6-8 PM No consistent pattern seen  Physical activity: exercise: walking more regularly at work now   [employer] Readings from Last 3 Encounters:  11/25/18 (!) 311 lb (141.1 kg)  08/27/18 (!) 317 lb 6.4 oz (144 kg)  05/22/18 (!) 330 lb (149.7 kg)   Lab Results  Component Value Date   HGBA1C 6.8 (H) 11/20/2018   HGBA1C 7.3 (H) 08/19/2018   HGBA1C 7.5 (H) 04/18/2018   Lab Results  Component Value Date   MICROALBUR 0.8 08/19/2018   LDLCALC 68 08/19/2018   CREATININE 0.86 11/20/2018    OTHER active problems: See review of systems   Lab on 11/20/2018  Component Date Value Ref Range Status  . Sodium 11/20/2018 138  135 - 145 mEq/L Final  . Potassium 11/20/2018 3.8  3.5 - 5.1 mEq/L Final  . Chloride 11/20/2018 105  96 - 112 mEq/L Final  . CO2 11/20/2018 25  19 - 32 mEq/L Final  . Glucose, Bld 11/20/2018 71  70 - 99 mg/dL Final  . BUN 88/32/5498 14  6 - 23 mg/dL Final  . Creatinine, Ser 11/20/2018 0.86  0.40 - 1.50 mg/dL Final  .  Calcium 11/20/2018 8.8  8.4 - 10.5 mg/dL Final  . GFR 16/10/960405/14/2020 90.93  >60.00 mL/min Final  . Hgb A1c MFr Bld 11/20/2018 6.8* 4.6 - 6.5 % Final   Glycemic Control Guidelines for People with Diabetes:Non Diabetic:  <6%Goal of Therapy: <7%Additional Action Suggested:  >8%   . LH 11/20/2018 7.8  1.7 - 8.6 mIU/mL Final    Allergies as of 11/25/2018   No Known Allergies     Medication List       Accurate as of Nov 25, 2018  9:35 AM. If you have any questions, ask your nurse or doctor.        etodolac 300 MG capsule Commonly known as:  LODINE TAKE 1 CAPSULE BY MOUTH  EVERY 8 HOURS   fish oil-omega-3 fatty acids 1000 MG capsule Take 1,400 mg by mouth daily. 2 tablets twice a day   FreeStyle Libre 14 Day [facility] Device by Does not apply route  as directed.   FreeStyle Libre 14 Day Sensor Misc APPLY 1 SENSOR TO UPPER ARM - REMOVE AND REPLACE EVERY 14 DAYS   glimepiride 4 MG tablet Commonly known as:  AMARYL TAKE 1/2 TABLET BY MOUTH TWICE A DAY   Insulin Pen Needle 32G X 4 MM Misc Use 1 needle daily.   levocetirizine 5 MG tablet Commonly known as:  XYZAL Take 5 mg by mouth every evening. TAKE 1 TABLET DAILY.   lisinopril 10 MG tablet Commonly known as:  ZESTRIL TAKE 1 TABLET BY MOUTH  DAILY   metFORMIN 1000 MG tablet Commonly known as:  GLUCOPHAGE TAKE 1 TABLET BY MOUTH TWO  TIMES DAILY   mometasone 50 MCG/ACT nasal spray Commonly known as:  NASONEX SPRAY TWO SPRAYS IN EACH NOSTRIL ONCE A DAY   omeprazole 20 MG capsule Commonly known as:  PRILOSEC Take 20 mg by mouth daily.   phentermine 15 MG capsule Take 1 capsule (15 mg total) by mouth every morning.   simvastatin 40 MG tablet Commonly known as:  ZOCOR TAKE 1 TABLET BY MOUTH  DAILY   topiramate 25 MG tablet Commonly known as:  TOPAMAX TAKE 1 TABLET BY MOUTH AT DINNER FOR 15 DAYS, THEN 2 TABLETS ONCE DAILY   Victoza 18 MG/3ML Sopn Generic drug:  liraglutide INJECT SUBCUTANEOUSLY 1.8MG  DAILY AT 12 NOON.       Allergies: No Known Allergies  Past Medical History:  Diagnosis Date  . Asthma   . Hyperlipidemia   . Hypertension   . Sleep apnea     No past surgical history on file.  Family History  Problem Relation Age of Onset  . Cancer Mother   . Hypertension Mother   . Cancer Father   . Hypertension Father   . Hypertension Brother   . Diabetes Brother   . Diabetes Maternal Grandmother     Social History:  reports that he has never smoked. He has never used smokeless tobacco. He reports that he does not drink alcohol or use drugs.  Review of Systems:   HYPERTENSION:   Is well controlled with lisinopril 10mg   HYPERLIPIDEMIA:   Has good control with simvastatin 40 mg, triglycerides and LDL normal   Lab Results  Component Value  Date   CHOL 119 08/19/2018   HDL 37.80 (L) 08/19/2018   LDLCALC 68 08/19/2018   LDLDIRECT 91.1 05/27/2014   TRIG 66.0 08/19/2018   CHOLHDL 3 08/19/2018     Lab Results  Component Value Date   ALT 24 08/19/2018  Diabetic foot exam in 11/18 shows normal monofilament sensation in the toes and plantar surfaces, no skin lesions or ulcers on the feet and normal pedal pulses  He was having some issues with feeling tired in his legs no generalized fatigue He says he was told by the pain specialist that he needs surgery for poor circulation  Has not had evaluation of his testosterone in the past, has not had any symptoms of fatigue or weakness   Examination:   BP 110/72 (BP Location: Right Arm, Patient Position: Sitting, Cuff Size: Large)   Pulse 83   Temp 98.9 F (37.2 C) (Oral)   Wt (!) 311 lb (141.1 kg)   SpO2 98%   BMI 41.03 kg/m   Body mass index is 41.03 kg/m.      ASSESSMENT/ PLAN:  Diabetes type 2 with morbid obesity  See history of present illness for detailed discussion of current diabetes management, blood sugar patterns and problems identified   A1c significantly better at 6.8  He has benefited from weight loss using phentermine and topiramate and no side effects with this so far Also is able to do more walking Also likely not getting as much fast food and snacks   He is currently on a regimen of Victoza, Amaryl and metformin Previously intolerant to SGLT2 drugs  He is however not checking blood sugars much especially after meals Currently with a total of 4 mg Amaryl his blood sugar on the sensor occasionally shows relatively low readings, asymptomatic  We will try to have him take only 2 mg Amaryl instead of total of 4 mg and stop the medication at dinnertime and he will need to make sure he checks sugars after supper Follow-up in 4 months  Recommend following up with fatigue for his nonspecific leg symptoms  There are no Patient Instructions on file  for this visit.   Reather Littler 11/25/2018, 9:35 AM

## 2018-11-25 ENCOUNTER — Encounter: Payer: Self-pay | Admitting: Endocrinology

## 2018-11-25 ENCOUNTER — Ambulatory Visit: Payer: BLUE CROSS/BLUE SHIELD | Admitting: Endocrinology

## 2018-11-25 ENCOUNTER — Other Ambulatory Visit: Payer: Self-pay

## 2018-11-25 VITALS — BP 110/72 | HR 83 | Temp 98.9°F | Wt 311.0 lb

## 2018-11-25 DIAGNOSIS — E1169 Type 2 diabetes mellitus with other specified complication: Secondary | ICD-10-CM

## 2018-11-25 DIAGNOSIS — E669 Obesity, unspecified: Secondary | ICD-10-CM | POA: Diagnosis not present

## 2018-11-25 NOTE — Patient Instructions (Addendum)
Glimeperide only at bedtime  Check blood sugars on waking up 3 days a week  Also check blood sugars about 2 hours after meals and do this after different meals by rotation daily  Recommended blood sugar levels on waking up are 90-130 and about 2 hours after meal is 130-160

## 2018-11-30 ENCOUNTER — Other Ambulatory Visit: Payer: Self-pay | Admitting: Endocrinology

## 2019-01-26 ENCOUNTER — Telehealth: Payer: Self-pay | Admitting: Endocrinology

## 2019-01-26 NOTE — Telephone Encounter (Signed)
Called pt and gave him MD message. Pt verbalized understanding. 

## 2019-01-26 NOTE — Telephone Encounter (Signed)
Patient called and is asking about medication management due to Iodine pill.  He did radioactive Iodine and was told not to talk Meltformin for 2 days due to reaction with Iodine.  Patient is asking if he should take more of his other medications instead to maintain BS levels.  Please advise - Call bak number is [phone]

## 2019-01-26 NOTE — Telephone Encounter (Signed)
I need more specific details.  I have not been aware that he is doing radioactive iodine treatments as he has not had a thyroid problem

## 2019-01-26 NOTE — Telephone Encounter (Signed)
Pt has CAT scan with contrast and gave him pt instructions that said to not take metformin for 48 hours r/t possible reactions.

## 2019-01-26 NOTE — Telephone Encounter (Signed)
2 days without metformin should not make any difference

## 2019-01-29 ENCOUNTER — Other Ambulatory Visit: Payer: Self-pay | Admitting: Endocrinology

## 2019-01-30 NOTE — Telephone Encounter (Signed)
Ok to refill 

## 2019-01-30 NOTE — Telephone Encounter (Signed)
Noted  

## 2019-01-30 NOTE — Telephone Encounter (Signed)
This has to be done by the PCP

## 2019-03-03 ENCOUNTER — Other Ambulatory Visit: Payer: Self-pay | Admitting: Endocrinology

## 2019-03-03 ENCOUNTER — Other Ambulatory Visit: Payer: Self-pay | Admitting: Internal Medicine

## 2019-03-13 ENCOUNTER — Other Ambulatory Visit: Payer: Self-pay | Admitting: Endocrinology

## 2019-03-17 ENCOUNTER — Other Ambulatory Visit: Payer: Self-pay | Admitting: Urology

## 2019-03-17 ENCOUNTER — Other Ambulatory Visit (HOSPITAL_COMMUNITY)
Admission: RE | Admit: 2019-03-17 | Discharge: 2019-03-17 | Disposition: A | Discharge status: Home / Self Care | Payer: BC Managed Care – PPO | Source: Ambulatory Visit | Attending: Urology | Admitting: Urology

## 2019-03-17 DIAGNOSIS — Z20828 Contact with and (suspected) exposure to other viral communicable diseases: Secondary | ICD-10-CM | POA: Insufficient documentation

## 2019-03-17 DIAGNOSIS — Z01812 Encounter for preprocedural laboratory examination: Secondary | ICD-10-CM | POA: Insufficient documentation

## 2019-03-17 LAB — SARS CORONAVIRUS 2 (TAT 6-24 HRS): SARS Coronavirus 2: NEGATIVE

## 2019-03-18 ENCOUNTER — Other Ambulatory Visit: Payer: Self-pay | Admitting: Urology

## 2019-03-18 ENCOUNTER — Encounter (HOSPITAL_COMMUNITY): Payer: Self-pay

## 2019-03-18 ENCOUNTER — Other Ambulatory Visit (INDEPENDENT_AMBULATORY_CARE_PROVIDER_SITE_OTHER): Payer: BC Managed Care – PPO

## 2019-03-18 ENCOUNTER — Other Ambulatory Visit: Payer: Self-pay

## 2019-03-18 DIAGNOSIS — E669 Obesity, unspecified: Secondary | ICD-10-CM

## 2019-03-18 DIAGNOSIS — E1169 Type 2 diabetes mellitus with other specified complication: Secondary | ICD-10-CM

## 2019-03-18 LAB — COMPREHENSIVE METABOLIC PANEL
ALT: 25 U/L (ref 0–53)
AST: 25 U/L (ref 0–37)
Albumin: 4 g/dL (ref 3.5–5.2)
Alkaline Phosphatase: 70 U/L (ref 39–117)
BUN: 10 mg/dL (ref 6–23)
CO2: 27 mEq/L (ref 19–32)
Calcium: 9.3 mg/dL (ref 8.4–10.5)
Chloride: 106 mEq/L (ref 96–112)
Creatinine, Ser: 0.89 mg/dL (ref 0.40–1.50)
GFR: 87.3 mL/min (ref >60.00)
Glucose, Bld: 126 mg/dL — ABNORMAL HIGH (ref 70–99)
Potassium: 4.1 mEq/L (ref 3.5–5.1)
Sodium: 139 mEq/L (ref 135–145)
Total Bilirubin: 1.5 mg/dL — ABNORMAL HIGH (ref 0.2–1.2)
Total Protein: 7.2 g/dL (ref 6.0–8.3)

## 2019-03-18 LAB — HEMOGLOBIN A1C: Hgb A1c MFr Bld: 6.9 % — ABNORMAL HIGH (ref 4.6–6.5)

## 2019-03-18 NOTE — Progress Notes (Addendum)
SPOKE W/  Antony Haste     SCREENING SYMPTOMS OF COVID 19:   COUGH--NO  RUNNY NOSE--- NO  SORE THROAT---NO  NASAL CONGESTION----NO  SNEEZING----NO  SHORTNESS OF BREATH---NO  DIFFICULTY BREATHING---NO  TEMP >100.0 -----NO  UNEXPLAINED BODY ACHES------NO  CHILLS -------- NO  HEADACHES ---------NO  LOSS OF SMELL/ TASTE --------NO    HAVE YOU OR ANY FAMILY MEMBER TRAVELLED PAST 14 DAYS OUT OF THE   COUNTY---Lives in [place], [place]----Lives in [place]----NO  HAVE YOU OR ANY FAMILY MEMBER BEEN EXPOSED TO ANYONE WITH COVID 19? Yes, at work

## 2019-03-18 NOTE — Progress Notes (Signed)
Spoke w/ pt via phone to update his instructions for surgery tomorrow since his procedure has been moved to [facility] from [place] main OR.  Pt verbalized understanding to arrive at [facility], directions given.  Arrive at [employer].  Npo after mn w/ exception sips of water with prilosec and flomax.  Needs istat 8 and ekg.    Pt medical history completed by Harlon Flor RN eariler today and medication list completed by pharmacy 03-17-2019.  Pt to bring cpap mask/ tubing.

## 2019-03-18 NOTE — H&P (Signed)
CC: I have ureteral stone.  HPI: Darryl Hunt is a 58 year-old male established patient who is here for ureteral stone.  The problem is on the left side. He first stated noticing pain on 01/25/2019. This is his first kidney stone. He is not currently having flank pain, back pain, groin pain, nausea, vomiting, fever or chills. He has not caught a stone in his urine strainer since his symptoms began.   03/17/19: Darryl Hunt returns today in f/u for his 50m left distal stone. He has not passed the stone but remains pain free and has had no further hematuria. He has some chronic frequency but no increased voiding symptoms.    02[facility]25/19: [facility] and was found to have a 6 mm left UVJ stone. He had severe pain on 7/19 and then nothing until yesterday when he had gross hematuria but didn't pass a stone or clots. He has no frequency or urgency. he has had no further pain. He has no prior GU history. He was given tamsulosin which he remains on for the last month. He is on topiramate for weight loss.     CC: I am having trouble with my erections.  HPI:   He has some difficulty obtaining his erections and also difficulty maintaining the erections. He will get a fairly firm erection that is lost prior to climax. He has not tried any therapy. He has had issues progressive over the last few years. His libido has declined. He has had a normal testosterone.      ALLERGIES: None   MEDICATIONS: Lisinopril 10 mg tablet  Metformin Hcl 1,000 mg tablet  Omeprazole 20 mg capsule,delayed release  Simvastatin 40 mg tablet  Tamsulosin Hcl 0.4 mg capsule  Etodolac 300 mg capsule  Fish Oil  Glimepiride 4 mg tablet  Phentermine Hcl 15 mg capsule  Topiramate 25 mg tablet  Victoza 2-Pak  Xyzal 5 mg tablet     GU PSH: None   NON-GU PSH: Anesth, Lower Arm Surgery Cholecystectomy (laparoscopic) Shoulder Surgery (Unspecified), Right Sinus surgery     GU PMH:  Ureteral calculus, He is not having much pain but there is a 686mstone in the left pelvis with moderate hydro. He has a lot going on for the next couple of weeks. I will have him return in early September for a repeat KUB. I discussed continued MET, URS and ESWL. - 02/25/2019 Ureteral obstruction secondary to calculous - 02/25/2019    NON-GU PMH: Arthritis Diabetes Type 2 GERD Hypercholesterolemia Hypertension Liver Disease Sleep Apnea    FAMILY HISTORY: liver cancer - Mother Lung Cancer - Father   SOCIAL HISTORY: Marital Status: Married Preferred Language: English; Race: White Current Smoking Status: Patient has never smoked.   Tobacco Use Assessment Completed: Used Tobacco in last 30 days? Drinks 1 caffeinated drink per day.     Notes: 1 daughter    REVIEW OF SYSTEMS:    GU Review Male:   Patient denies frequent urination, hard to postpone urination, burning/ pain with urination, get up at night to urinate, leakage of urine, stream starts and stops, trouble starting your stream, have to strain to urinate , erection problems, and penile pain.  Gastrointestinal (Upper):   Patient denies nausea, vomiting, and indigestion/ heartburn.  Gastrointestinal (Lower):   Patient denies constipation and diarrhea.  Constitutional:   Patient denies fever, night sweats, weight loss, and fatigue.  Skin:   Patient denies skin rash/ lesion  and itching.  Eyes:   Patient denies blurred vision and double vision.  Ears/ Nose/ Throat:   Patient denies sore throat and sinus problems.  Hematologic/Lymphatic:   Patient denies swollen glands and easy bruising.  Cardiovascular:   Patient denies leg swelling and chest pains.  Respiratory:   Patient denies cough and shortness of breath.  Endocrine:   Patient denies excessive thirst.  Musculoskeletal:   Patient denies back pain and joint pain.  Neurological:   Patient denies headaches and dizziness.  Psychologic:   Patient denies depression and anxiety.    VITAL SIGNS:      03/17/2019 08:21 AM  BP 126/71 mmHg  Pulse 72 /min  Temperature 98.0 F / 36.6 C   MULTI-SYSTEM PHYSICAL EXAMINATION:    Constitutional: Obese. No physical deformities. Normally developed. Good grooming.   Respiratory: Normal breath sounds. No labored breathing, no use of accessory muscles.   Cardiovascular: Regular rate and rhythm. No murmur, no gallop.      PAST DATA REVIEWED:  Source Of History:  Patient  Urine Test Review:   Urinalysis  X-Ray Review: KUB: Reviewed Films. Discussed With Patient.     PROCEDURES:         KUB - 74018  A single view of the abdomen is obtained. There is no change in the location of the 6mm Left distal stone. No other abnormalities are noted.       Pt had Diabetic Sensor on Right Upper Arm - Sensor was shielded for KUB per Dr. Wrenn         Urinalysis Dipstick Dipstick Cont'd  Color: Yellow Bilirubin: Neg mg/dL  Appearance: Clear Ketones: Neg mg/dL  Specific Gravity: 1.015 Blood: Neg ery/uL  pH: 7.0 Protein: Trace mg/dL  Glucose: Neg mg/dL Urobilinogen: 1.0 mg/dL    Nitrites: Neg    Leukocyte Esterase: Neg leu/uL    Notes: Confirmed by repeat testing.    ASSESSMENT:      ICD-10 Details  1 GU:   Ureteral calculus - N20.1 Stable - He has had no progression of the stone. I discussed URS and ESWL and he would like to proceed with URS. I have reviewed the risks of ureteroscopy including bleeding, infection, ureteral injury, need for a stent or secondary procedures, thrombotic events and anesthetic complications.   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 I also discussed lifestyle changes with weight loss and exercise which could help his condition and also oral meds which he hasn't tried. He would like to get the stone taken care of first and we can readdress this at f/u.    PLAN:           Orders X-Rays: KUB           Schedule Return Visit/Planned Activity: ASAP - Schedule Surgery          Document Letter(s):  Created for Patient: Clinical Summary         Notes:   CC: [facility] in [place], Lisa Bradner FNP.         Next Appointment:      Next Appointment: 03/25/2019 08:45 AM    Appointment Type: KUB    Location: [facility] - [id number]    Provider: KUB KUB    Reason for Visit: kub ov wrenn      

## 2019-03-19 ENCOUNTER — Other Ambulatory Visit: Payer: Self-pay

## 2019-03-19 ENCOUNTER — Ambulatory Visit (HOSPITAL_BASED_OUTPATIENT_CLINIC_OR_DEPARTMENT_OTHER)
Admission: RE | Admit: 2019-03-19 | Discharge: 2019-03-19 | Disposition: A | Discharge status: Home / Self Care | Payer: BC Managed Care – PPO | Attending: Urology | Admitting: Urology

## 2019-03-19 ENCOUNTER — Ambulatory Visit (HOSPITAL_BASED_OUTPATIENT_CLINIC_OR_DEPARTMENT_OTHER): Payer: BC Managed Care – PPO | Admitting: Physician Assistant

## 2019-03-19 ENCOUNTER — Encounter (HOSPITAL_BASED_OUTPATIENT_CLINIC_OR_DEPARTMENT_OTHER)
Admission: RE | Disposition: A | Discharge status: Home / Self Care | Payer: Self-pay | Source: Home / Self Care | Attending: Urology

## 2019-03-19 ENCOUNTER — Encounter (HOSPITAL_BASED_OUTPATIENT_CLINIC_OR_DEPARTMENT_OTHER): Payer: Self-pay

## 2019-03-19 DIAGNOSIS — E119 Type 2 diabetes mellitus without complications: Secondary | ICD-10-CM | POA: Insufficient documentation

## 2019-03-19 DIAGNOSIS — K219 Gastro-esophageal reflux disease without esophagitis: Secondary | ICD-10-CM | POA: Diagnosis not present

## 2019-03-19 DIAGNOSIS — Z801 Family history of malignant neoplasm of trachea, bronchus and lung: Secondary | ICD-10-CM | POA: Diagnosis not present

## 2019-03-19 DIAGNOSIS — N201 Calculus of ureter: Secondary | ICD-10-CM | POA: Diagnosis present

## 2019-03-19 DIAGNOSIS — E7800 Pure hypercholesterolemia, unspecified: Secondary | ICD-10-CM | POA: Insufficient documentation

## 2019-03-19 DIAGNOSIS — Z79899 Other long term (current) drug therapy: Secondary | ICD-10-CM | POA: Diagnosis not present

## 2019-03-19 DIAGNOSIS — Z7984 Long term (current) use of oral hypoglycemic drugs: Secondary | ICD-10-CM | POA: Insufficient documentation

## 2019-03-19 DIAGNOSIS — Z800 Family history of malignant neoplasm of digestive organs: Secondary | ICD-10-CM | POA: Diagnosis not present

## 2019-03-19 DIAGNOSIS — K769 Liver disease, unspecified: Secondary | ICD-10-CM | POA: Diagnosis not present

## 2019-03-19 DIAGNOSIS — Z9049 Acquired absence of other specified parts of digestive tract: Secondary | ICD-10-CM | POA: Insufficient documentation

## 2019-03-19 DIAGNOSIS — I10 Essential (primary) hypertension: Secondary | ICD-10-CM | POA: Diagnosis not present

## 2019-03-19 DIAGNOSIS — G4730 Sleep apnea, unspecified: Secondary | ICD-10-CM | POA: Diagnosis not present

## 2019-03-19 HISTORY — DX: Presence of external hearing-aid: Z97.4

## 2019-03-19 HISTORY — DX: Obstructive sleep apnea (adult) (pediatric): G47.33

## 2019-03-19 HISTORY — PX: CYSTOSCOPY WITH RETROGRADE PYELOGRAM, URETEROSCOPY AND STENT PLACEMENT: SHX5789

## 2019-03-19 HISTORY — DX: Type 2 diabetes mellitus without complications: E11.9

## 2019-03-19 HISTORY — DX: Other seasonal allergic rhinitis: J30.2

## 2019-03-19 HISTORY — DX: Fatty (change of) liver, not elsewhere classified: K76.0

## 2019-03-19 HISTORY — DX: Asymptomatic varicose veins of unspecified lower extremity: I83.90

## 2019-03-19 HISTORY — DX: Mononeuropathy, unspecified: G58.9

## 2019-03-19 HISTORY — DX: Gastro-esophageal reflux disease without esophagitis: K21.9

## 2019-03-19 HISTORY — DX: Unspecified osteoarthritis, unspecified site: M19.90

## 2019-03-19 HISTORY — DX: Personal history of urinary calculi: Z87.442

## 2019-03-19 HISTORY — DX: Presence of spectacles and contact lenses: Z97.3

## 2019-03-19 LAB — POCT I-STAT, CHEM 8
BUN: 12 mg/dL (ref 6–20)
Calcium, Ion: 1.26 mmol/L (ref 1.15–1.40)
Chloride: 107 mmol/L (ref 98–111)
Creatinine, Ser: 0.8 mg/dL (ref 0.61–1.24)
Glucose, Bld: 147 mg/dL — ABNORMAL HIGH (ref 70–99)
HCT: 39 % (ref 39.0–52.0)
Hemoglobin: 13.3 g/dL (ref 13.0–17.0)
Potassium: 4.6 mmol/L (ref 3.5–5.1)
Sodium: 141 mmol/L (ref 135–145)
TCO2: 21 mmol/L — ABNORMAL LOW (ref 22–32)

## 2019-03-19 LAB — GLUCOSE, CAPILLARY: Glucose-Capillary: 149 mg/dL — ABNORMAL HIGH (ref 70–99)

## 2019-03-19 SURGERY — CYSTOURETEROSCOPY, WITH RETROGRADE PYELOGRAM AND STENT INSERTION
Anesthesia: General | Site: Ureter | Laterality: Left

## 2019-03-19 MED ORDER — LIDOCAINE 2% (20 MG/ML) 5 ML SYRINGE
INTRAMUSCULAR | Status: DC | PRN
  Administered 2019-03-19: 100 mg via INTRAVENOUS

## 2019-03-19 MED ORDER — DEXAMETHASONE SODIUM PHOSPHATE 10 MG/ML IJ SOLN
INTRAMUSCULAR | Status: DC | PRN
  Administered 2019-03-19: 5 mg via INTRAVENOUS

## 2019-03-19 MED ORDER — SODIUM CHLORIDE 0.9 % IR SOLN
Status: DC | PRN
  Administered 2019-03-19: 1 via INTRAVESICAL

## 2019-03-19 MED ORDER — ONDANSETRON HCL 4 MG/2ML IJ SOLN
INTRAMUSCULAR | Status: DC | PRN
  Administered 2019-03-19: 4 mg via INTRAVENOUS

## 2019-03-19 MED ORDER — CEFAZOLIN SODIUM-DEXTROSE 2-4 GM/100ML-% IV SOLN
2.0000 g | Freq: Once | INTRAVENOUS | Status: AC
  Administered 2019-03-19: 10:00:00 3 g via INTRAVENOUS
  Filled 2019-03-19: qty 100

## 2019-03-19 MED ORDER — LIDOCAINE 2% (20 MG/ML) 5 ML SYRINGE
INTRAMUSCULAR | Status: AC
  Filled 2019-03-19: qty 5

## 2019-03-19 MED ORDER — PHENAZOPYRIDINE HCL 200 MG PO TABS: 200.0000 mg | ORAL_TABLET | Freq: Three times a day (TID) | ORAL | 0 refills | Status: DC | PRN

## 2019-03-19 MED ORDER — PROPOFOL 10 MG/ML IV BOLUS
INTRAVENOUS | Status: AC
  Filled 2019-03-19: qty 20

## 2019-03-19 MED ORDER — FENTANYL CITRATE (PF) 100 MCG/2ML IJ SOLN
INTRAMUSCULAR | Status: DC | PRN
  Administered 2019-03-19 (×2): 50 ug via INTRAVENOUS

## 2019-03-19 MED ORDER — FENTANYL CITRATE (PF) 100 MCG/2ML IJ SOLN
INTRAMUSCULAR | Status: AC
  Filled 2019-03-19: qty 2

## 2019-03-19 MED ORDER — MIDAZOLAM HCL 5 MG/5ML IJ SOLN
INTRAMUSCULAR | Status: DC | PRN
  Administered 2019-03-19: 2 mg via INTRAVENOUS

## 2019-03-19 MED ORDER — MIDAZOLAM HCL 2 MG/2ML IJ SOLN
INTRAMUSCULAR | Status: AC
  Filled 2019-03-19: qty 2

## 2019-03-19 MED ORDER — LACTATED RINGERS IV SOLN
INTRAVENOUS | Status: DC
  Administered 2019-03-19: 1000 mL via INTRAVENOUS
  Filled 2019-03-19: qty 1000

## 2019-03-19 MED ORDER — DEXAMETHASONE SODIUM PHOSPHATE 10 MG/ML IJ SOLN
INTRAMUSCULAR | Status: AC
  Filled 2019-03-19: qty 1

## 2019-03-19 MED ORDER — FENTANYL CITRATE (PF) 100 MCG/2ML IJ SOLN
25.0000 ug | INTRAMUSCULAR | Status: DC | PRN
  Filled 2019-03-19: qty 1

## 2019-03-19 MED ORDER — CEFAZOLIN SODIUM-DEXTROSE 2-4 GM/100ML-% IV SOLN
INTRAVENOUS | Status: AC
  Filled 2019-03-19: qty 100

## 2019-03-19 MED ORDER — ONDANSETRON HCL 4 MG/2ML IJ SOLN
INTRAMUSCULAR | Status: AC
  Filled 2019-03-19: qty 2

## 2019-03-19 MED ORDER — CEFAZOLIN SODIUM-DEXTROSE 1-4 GM/50ML-% IV SOLN
INTRAVENOUS | Status: AC
  Filled 2019-03-19: qty 50

## 2019-03-19 MED ORDER — KETOROLAC TROMETHAMINE 30 MG/ML IJ SOLN
INTRAMUSCULAR | Status: AC
  Filled 2019-03-19: qty 1

## 2019-03-19 MED ORDER — KETOROLAC TROMETHAMINE 30 MG/ML IJ SOLN
INTRAMUSCULAR | Status: DC | PRN
  Administered 2019-03-19: 30 mg via INTRAVENOUS

## 2019-03-19 MED ORDER — PROPOFOL 10 MG/ML IV BOLUS
INTRAVENOUS | Status: DC | PRN
  Administered 2019-03-19: 100 mg via INTRAVENOUS
  Administered 2019-03-19: 300 mg via INTRAVENOUS

## 2019-03-19 SURGICAL SUPPLY — 25 items
BAG DRAIN URO-CYSTO SKYTR STRL (DRAIN) ×3 IMPLANT
BASKET STONE 1.7 NGAGE (UROLOGICAL SUPPLIES) ×3 IMPLANT
BASKET ZERO TIP NITINOL 2.4FR (BASKET) IMPLANT
CATH URET 5FR 28IN CONE TIP (BALLOONS)
CATH URET 5FR 28IN OPEN ENDED (CATHETERS) IMPLANT
CATH URET 5FR 70CM CONE TIP (BALLOONS) IMPLANT
CLOTH BEACON ORANGE TIMEOUT ST (SAFETY) ×3 IMPLANT
ELECT REM PT RETURN 9FT ADLT (ELECTROSURGICAL)
ELECTRODE REM PT RTRN 9FT ADLT (ELECTROSURGICAL) IMPLANT
FIBER LASER FLEXIVA 365 (UROLOGICAL SUPPLIES) IMPLANT
FIBER LASER TRAC TIP (UROLOGICAL SUPPLIES) ×3 IMPLANT
GLOVE SURG SS PI 8.0 STRL IVOR (GLOVE) ×3 IMPLANT
GOWN STRL REUS W/TWL XL LVL3 (GOWN DISPOSABLE) ×3 IMPLANT
GUIDEWIRE ANG ZIPWIRE 038X150 (WIRE) IMPLANT
GUIDEWIRE STR DUAL SENSOR (WIRE) ×3 IMPLANT
IV NS IRRIG 3000ML ARTHROMATIC (IV SOLUTION) ×3 IMPLANT
KIT TURNOVER CYSTO (KITS) ×3 IMPLANT
MANIFOLD NEPTUNE II (INSTRUMENTS) ×3 IMPLANT
NS IRRIG 500ML POUR BTL (IV SOLUTION) ×3 IMPLANT
PACK CYSTO (CUSTOM PROCEDURE TRAY) ×3 IMPLANT
SHEATH URETERAL 12FRX28CM (UROLOGICAL SUPPLIES) ×3 IMPLANT
STENT URET 6FRX26 CONTOUR (STENTS) ×3 IMPLANT
TUBE CONNECTING 12'X1/4 (SUCTIONS) ×1
TUBE CONNECTING 12X1/4 (SUCTIONS) ×2 IMPLANT
TUBING UROLOGY SET (TUBING) ×3 IMPLANT

## 2019-03-19 NOTE — Interval H&P Note (Signed)
History and Physical Interval Note:  03/19/2019 9:40 AM  Darryl Hunt  has presented today for surgery, with the diagnosis of LEFT DISTAL STONE.  The various methods of treatment have been discussed with the patient and family. After consideration of risks, benefits and other options for treatment, the patient has consented to  Procedure(s): CYSTOSCOPY WITH LEFT  RETROGRADE URETEROSCOPY WITH HOLMIUM LASER ANDPOSSIBLE STENT STENT PLACEMENT (Left) as a surgical intervention.  The patient's history has been reviewed, patient examined, no change in status, stable for surgery.  I have reviewed the patient's chart and labs.  Questions were answered to the patient's satisfaction.     [name]

## 2019-03-19 NOTE — Discharge Instructions (Signed)
REMOVE STENT ON Sunday MORNING BY GENTLY PULLING THE STRINGS. CALL OFFICE IF UNABLE TO VOID WITHIN 6 HOURS OF STENT REMOVAL.  Post Anesthesia Home Care Instructions  Activity: Get plenty of rest for the remainder of the day. A responsible adult should stay with you for 24 hours following the procedure.  For the next 24 hours, DO NOT: -Drive a car -Advertising copywriterperate machinery -Drink alcoholic beverages -Take any medication unless instructed by your physician -Make any legal decisions or sign important papers.  Meals: Start with liquid foods such as gelatin or soup. Progress to regular foods as tolerated. Avoid greasy, spicy, heavy foods. If nausea and/or vomiting occur, drink only clear liquids until the nausea and/or vomiting subsides. Call your physician if vomiting continues.  Special Instructions/Symptoms: Your throat may feel dry or sore from the anesthesia or the breathing tube placed in your throat during surgery. If this causes discomfort, gargle with warm salt water. The discomfort should disappear within 24 hours.  If you had a scopolamine patch placed behind your ear for the management of post- operative nausea and/or vomiting:  1. The medication in the patch is effective for 72 hours, after which it should be removed.  Wrap patch in a tissue and discard in the trash. Wash hands thoroughly with soap and water. 2. You may remove the patch earlier than 72 hours if you experience unpleasant side effects which may include dry mouth, dizziness or visual disturbances. 3. Avoid touching the patch. Wash your hands with soap and water after contact with the patch.   [facility] [phone] Post Ureteroscopy With or Without Stent Instructions  Definitions:  Ureter: The duct that transports urine from the kidney to the bladder. Stent:   A plastic hollow tube that is placed into the ureter, from the kidney to the                 bladder to prevent the ureter from swelling  shut.  GENERAL INSTRUCTIONS:  Despite the fact that no skin incisions were used, the area around the ureter and bladder is raw and irritated. The stent is a foreign body which will further irritate the bladder wall. This irritation is manifested by increased frequency of urination, both day and night, and by an increase in the urge to urinate. In some, the urge to urinate is present almost always. Sometimes the urge is strong enough that you may not be able to stop yourself from urinating. The only real cure is to remove the stent and then give time for the bladder wall to heal which can't be done until the danger of the ureter swelling shut has passed, which varies.  You may see some blood in your urine while the stent is in place and a few days afterwards. Do not be alarmed, even if the urine was clear for a while. Get off your feet and drink lots of fluids until clearing occurs. If you start to pass clots or don't improve, call us.  DIET: You may return to your normal diet immediately. Because of the raw surface of your bladder, alcohol, spicy foods, acid type foods and drinks with caffeine may cause irritation or frequency and should be used in moderation. To keep your urine flowing freely and to avoid constipation, drink plenty of fluids during the day ( 8-10 glasses ). Tip: Avoid cranberry juice because it is very acidic.  ACTIVITY: Your physical activity doesn't need to be restricted. However, if you are very active, you may  see some blood in your urine. We suggest that you reduce your activity under these circumstances until the bleeding has stopped.  BOWELS: It is important to keep your bowels regular during the postoperative period. Straining with bowel movements can cause bleeding. A bowel movement every other day is reasonable. Use a mild laxative if needed, such as Milk of Magnesia 2-3 tablespoons, or 2 Dulcolax tablets. Call if you continue to have problems. If you have been taking  narcotics for pain, before, during or after your surgery, you may be constipated. Take a laxative if necessary.   MEDICATION: You should resume your pre-surgery medications unless told not to. In addition you will often be given an antibiotic to prevent infection. These should be taken as prescribed until the bottles are finished unless you are having an unusual reaction to one of the drugs.  PROBLEMS YOU SHOULD REPORT TO [place]:  Fevers over 100.5 Fahrenheit.  Heavy bleeding, or clots ( See above notes about blood in urine ).  Inability to urinate.  Drug reactions ( hives, rash, nausea, vomiting, diarrhea ).  Severe burning or pain with urination that is not improving.  FOLLOW-UP: You will need a follow-up appointment to monitor your progress. Call for this appointment at the number listed above. Usually the first appointment will be about three to fourteen days after your surgery.

## 2019-03-19 NOTE — Anesthesia Postprocedure Evaluation (Signed)
Anesthesia Post Note  Patient: DAREN YEAGLE  Procedure(s) Performed: CYSTOSCOPY WITH LEFT  RETROGRADE URETEROSCOPY WITH HOLMIUM LASER ANDPOSSIBLE STENT STENT PLACEMENT (Left Ureter)     Patient location during evaluation: PACU Anesthesia Type: General Level of consciousness: awake Pain management: pain level controlled Vital Signs Assessment: post-procedure vital signs reviewed and stable Respiratory status: spontaneous breathing Postop Assessment: no apparent nausea or vomiting Anesthetic complications: no    Last Vitals:  Vitals:   03/19/19 1124 03/19/19 1230  BP:  (!) 142/71  Pulse: 78 77  Resp: (!) 21 18  Temp:  36.6 C  SpO2: 97% 97%    Last Pain:  Vitals:   03/19/19 1200  TempSrc:   PainSc: 0-No pain                 Hennie Gosa

## 2019-03-19 NOTE — Op Note (Signed)
Procedure: Cystoscopy with left ureteroscopy, holmium laser application, stone removal and placement of left double-J stent.  Preop diagnosis: Left distal ureteral stone.  Postop diagnosis: Same.  Surgeon: Dr. [name].  Anesthesia: General.  Specimen: Stone fragments.  Drains: 6 French by 26 cm contour double-J stent on the left with tether.  EBL: None.  Complications: None.  Indications: The patient is a 58 year old male with a 6 mm left distal ureteral stone with nonprogression.  He is elected ureteroscopy for treatment.  Procedure: He was given 3 g of Ancef.  A general anesthetic was induced.  He was placed in lithotomy position and fitted with PAS hose.  His perineum and genitalia were prepped with Betadine solution and he was draped in usual sterile fashion.  Cystoscopy was performed using a 23 [place] scope and 30 degree lens.  Examination revealed a normal urethra.  The external sphincter was intact.  The prostatic urethra was short bilobar hyperplasia with minimal obstruction.  The bladder wall was smooth and pale without tumors, stones or inflammation.  Ureteral orifices were unremarkable.  The left ureteral orifice was cannulated with a sensor wire which was passed to the kidney.  It was noted to go alongside the stone.  The cystoscope was then removed and a 25 cm digital access sheath 12 French inner core was passed over the wire to dilate the distal ureter.  The assembled core was then passed to increase the dilation.  The single-lumen semirigid ureteroscope was then passed alongside the wire and the stone was visualized.  During the initial passage the ureteroscope went into a short distal ureteral duplication but that was quickly recognized and the proper urethral lumen was cannulated.  An attempt to remove the stone with the engage basket was unsuccessful due to the stone size.  A 200 m tract tip laser fiber was then passed and the stone was fragmented with the laser.  The  initial settings were 0.5 J and 10 Hz with a power increased to 1 J to facilitate fragmentation.  Once the stone was fragmented the fragments were removed with the engage basket to the bladder.  Final inspection revealed a few small bits of dust and grit in the ureteral lumen.  There was minimal ureteral irritation or bleeding but it was felt that a stent was indicated.  The cystoscope was then reinserted over the wire and a 6 [place] by 26 cm contour double-J stent was advanced the kidney under fluoroscopic guidance.  The wire was removed, leaving a good coil in the kidney and a good coil in the bladder.  The cystoscope was removed leaving the stent string exiting urethra.  The cystoscope was then reinserted and the stone fragments were removed.  The bladder was drained.  The cystoscope was removed.  A stent string was secured to the patient's penis.  He was taken down from lithotomy position, his anesthetic was reversed and he was moved recovery in stable condition.  There were no complications.  Wife was given the stone fragments.

## 2019-03-19 NOTE — Anesthesia Procedure Notes (Signed)
Procedure Name: LMA Insertion Date/Time: 03/19/2019 9:59 AM Performed by: Bonney Aid, CRNA Pre-anesthesia Checklist: Patient identified, Emergency Drugs available, Suction available and Patient being monitored Patient Re-evaluated:Patient Re-evaluated prior to induction Oxygen Delivery Method: Circle system utilized Preoxygenation: Pre-oxygenation with 100% oxygen Induction Type: IV induction Ventilation: Mask ventilation without difficulty LMA: LMA inserted LMA Size: 5.0 Number of attempts: 1 Airway Equipment and Method: Bite block Placement Confirmation: positive ETCO2 Tube secured with: Tape Dental Injury: Teeth and Oropharynx as per pre-operative assessment

## 2019-03-19 NOTE — Transfer of Care (Signed)
Immediate Anesthesia Transfer of Care Note  Patient: Darryl Hunt  Procedure(s) Performed: CYSTOSCOPY WITH LEFT  RETROGRADE URETEROSCOPY WITH HOLMIUM LASER ANDPOSSIBLE STENT STENT PLACEMENT (Left Ureter)  Patient Location: PACU  Anesthesia Type:General  Level of Consciousness: drowsy  Airway & Oxygen Therapy: Patient Spontanous Breathing and Patient connected to nasal cannula oxygen  Post-op Assessment: Report given to RN  Post vital signs: Reviewed and stable  Last Vitals: 149/79 Vitals Value Taken Time  BP    Temp    Pulse 87 03/19/19 1043  Resp 19 03/19/19 1043  SpO2 95 % 03/19/19 1043  Vitals shown include unvalidated device data.  Last Pain:  Vitals:   03/19/19 0850  TempSrc: Oral  PainSc: 0-No pain      Patients Stated Pain Goal: 7 (01/75/10 2585)  Complications: No apparent anesthesia complications

## 2019-03-19 NOTE — Anesthesia Preprocedure Evaluation (Signed)
Anesthesia Evaluation  Patient identified by MRN, date of birth, ID band Patient awake    Reviewed: Allergy & Precautions, NPO status , Patient's Chart, lab work & pertinent test results  Airway Mallampati: II  TM Distance: >3 FB     Dental   Pulmonary    breath sounds clear to auscultation       Cardiovascular hypertension,  Rhythm:Regular Rate:Normal     Neuro/Psych    GI/Hepatic GERD  ,  Endo/Other  diabetes  Renal/GU      Musculoskeletal   Abdominal   Peds  Hematology   Anesthesia Other Findings   Reproductive/Obstetrics                             Anesthesia Physical Anesthesia Plan  ASA: III  Anesthesia Plan: General   Post-op Pain Management:    Induction: Intravenous  PONV Risk Score and Plan: 2 and Ondansetron, Dexamethasone and Midazolam  Airway Management Planned: LMA  Additional Equipment:   Intra-op Plan:   Post-operative Plan: Extubation in OR  Informed Consent: I have reviewed the patients History and Physical, chart, labs and discussed the procedure including the risks, benefits and alternatives for the proposed anesthesia with the patient or authorized representative who has indicated his/her understanding and acceptance.     Dental advisory given  Plan Discussed with: CRNA and Anesthesiologist  Anesthesia Plan Comments:         Anesthesia Quick Evaluation

## 2019-03-20 ENCOUNTER — Other Ambulatory Visit: Payer: BLUE CROSS/BLUE SHIELD

## 2019-03-20 ENCOUNTER — Encounter (HOSPITAL_BASED_OUTPATIENT_CLINIC_OR_DEPARTMENT_OTHER): Payer: Self-pay | Admitting: Urology

## 2019-03-25 ENCOUNTER — Encounter: Payer: Self-pay | Admitting: Endocrinology

## 2019-03-25 ENCOUNTER — Other Ambulatory Visit: Payer: Self-pay

## 2019-03-25 ENCOUNTER — Ambulatory Visit (INDEPENDENT_AMBULATORY_CARE_PROVIDER_SITE_OTHER): Payer: BC Managed Care – PPO | Admitting: Endocrinology

## 2019-03-25 VITALS — BP 136/70 | HR 94 | Ht 73.0 in | Wt 313.6 lb

## 2019-03-25 DIAGNOSIS — E1169 Type 2 diabetes mellitus with other specified complication: Secondary | ICD-10-CM

## 2019-03-25 DIAGNOSIS — Z23 Encounter for immunization: Secondary | ICD-10-CM

## 2019-03-25 DIAGNOSIS — E782 Mixed hyperlipidemia: Secondary | ICD-10-CM | POA: Diagnosis not present

## 2019-03-25 DIAGNOSIS — E669 Obesity, unspecified: Secondary | ICD-10-CM | POA: Diagnosis not present

## 2019-03-25 NOTE — Progress Notes (Signed)
Patient ID: Darryl Hunt, male   DOB: [birth year], 59 y.o.   MRN: [id number]    Reason for Appointment: follow-up   History of Present Illness   Diagnosis: Type 2 DIABETES MELITUS, date of diagnosis: 2010        PREVIOUS history: He has been treated previously with regimen of metformin, glipizide and subsequently Victoza Overall has had fairly good control and glipizide was stopped because of tendency to hypoglycemia His A1c in the past has ranged from 6.7-7%, was 7% in 4/14 He was started on Invokana in 8/14; with this he started losing weight and blood sugars were consistently improved Because of recurrent balanitis he stopped this on his visit in 8/16  RECENT history:   He is on a regimen of Victoza 1.8 mg in am , Amaryl 2 mg at bedtime. and metformin 2 g q a day  A1c has stabilized and now 6.9 compared to 6.8  Current management, blood sugar patterns and problems identified:  He has been able to maintain some weight loss with phentermine and Topamax  However appears to have gained back a couple of pounds  He is mostly trying to walk when he can at work but does not appear to be doing any extra exercise  Usually trying to keep portions control  Although he is giving his sensor on continuously better than before he is still not monitoring blood sugars as directed and only checking in the morning  He does not usually take his freestyle reader with him and has not set up his smart phone to do his readings  Generally blood sugars are quite variable late evening and overnight and occasionally still has high POSTPRANDIAL readings after dinner  Morning readings are also variable  On his previous visit 1 of the Amaryl doses in the evening was stopped as blood sugars were low normal in the early evenings  Although his blood sugar appeared to be low late at night on 9/5 he did not have any symptoms  Lab fasting glucose 126 although fasting readings at home fluctuate  between 94 and 172   Side effects from medications: balanitis from Invokana.  GLUCOSE MONITORING results and analysis from freestyle libre:  He has only data for 30 % of the time over the last 2 weeks   PRE-MEAL Fasting Lunch Dinner Bedtime Overall  Glucose range:  94-172      Mean/median:  102  129  115  142  128     Physical activity: exercise: walking more regularly at work    [employer] Readings from Last 3 Encounters:  03/25/19 (!) 313 lb 9.6 oz (142.2 kg)  03/19/19 (!) 309 lb 15.5 oz (140.6 kg)  11/25/18 (!) 311 lb (141.1 kg)   Lab Results  Component Value Date   HGBA1C 6.9 (H) 03/18/2019   HGBA1C 6.8 (H) 11/20/2018   HGBA1C 7.3 (H) 08/19/2018   Lab Results  Component Value Date   MICROALBUR 0.8 08/19/2018   LDLCALC 68 08/19/2018   CREATININE 0.80 03/19/2019    OTHER active problems: See review of systems   Admission on 03/19/2019, Discharged on 03/19/2019  Component Date Value Ref Range Status  . Sodium 03/19/2019 141  135 - 145 mmol/L Final  . Potassium 03/19/2019 4.6  3.5 - 5.1 mmol/L Final  . Chloride 03/19/2019 107  98 - 111 mmol/L Final  . BUN 03/19/2019 12  6 - 20 mg/dL Final  . Creatinine, Ser 03/19/2019 0.80  0.61 - 1.24  mg/dL Final  . Glucose, Bld 89/16/9450 147* 70 - 99 mg/dL Final  . Calcium, Ion 38/88/2800 1.26  1.15 - 1.40 mmol/L Final  . TCO2 03/19/2019 21* 22 - 32 mmol/L Final  . Hemoglobin 03/19/2019 13.3  13.0 - 17.0 g/dL Final  . HCT 34/91/7915 39.0  39.0 - 52.0 % Final  . Glucose-Capillary 03/19/2019 149* 70 - 99 mg/dL Final  . Comment 1 05/69/7948 Document in Chart   Final    Allergies as of 03/25/2019      Reactions   Latex Other (See Comments)   Pt states he is not allergic to latex, and does not want it because his wife is allergic and If he gets it on him she breaks out.      Medication List       Accurate as of March 25, 2019  9:52 AM. If you have any questions, ask your nurse or doctor.        STOP taking these  medications   oxyCODONE 5 MG immediate release tablet Commonly known as: Oxy IR/ROXICODONE Stopped by: Reather Littler, MD   tamsulosin 0.4 MG Caps capsule Commonly known as: FLOMAX Stopped by: Reather Littler, MD     TAKE these medications   cyclobenzaprine 10 MG tablet Commonly known as: FLEXERIL Take 10 mg by mouth at bedtime.   etodolac 300 MG capsule Commonly known as: LODINE Take 300 mg by mouth 2 (two) times daily. Take 1 tablet by mouth twice daily. What changed: Another medication with the same name was removed. Continue taking this medication, and follow the directions you see here. Changed by: Reather Littler, MD   Fish Oil Ultra 1400 MG Caps Take 2,800 mg by mouth 2 (two) times daily.   FreeStyle Libre 14 Day Reader [name] 1 Device by Does not apply route as directed.   FreeStyle Libre 14 Day Sensor Misc APPLY 1 SENSOR TO UPPER ARM - REMOVE AND REPLACE EVERY 14 DAYS   glimepiride 4 MG tablet Commonly known as: AMARYL TAKE 1/2 TABLET BY MOUTH TWICE A DAY   Insulin Pen Needle 32G X 4 MM Misc Use 1 needle daily.   levocetirizine 5 MG tablet Commonly known as: XYZAL Take 5 mg by mouth every evening.   lisinopril 10 MG tablet Commonly known as: ZESTRIL TAKE 1 TABLET BY MOUTH  DAILY What changed:   how much to take  how to take this  when to take this  additional instructions   metFORMIN 1000 MG tablet Commonly known as: GLUCOPHAGE TAKE 1 TABLET BY MOUTH TWO  TIMES DAILY What changed: when to take this   mometasone 50 MCG/ACT nasal spray Commonly known as: NASONEX Place 2 sprays into the nose daily as needed (allergies).   omeprazole 20 MG capsule Commonly known as: PRILOSEC Take 20 mg by mouth daily.   phenazopyridine 200 MG tablet Commonly known as: Pyridium Take 1 tablet (200 mg total) by mouth 3 (three) times daily as needed for pain.   phentermine 15 MG capsule Take 1 capsule by mouth once daily in the morning What changed: See the new instructions.    simvastatin 40 MG tablet Commonly known as: ZOCOR TAKE 1 TABLET BY MOUTH  DAILY What changed: when to take this   topiramate 25 MG tablet Commonly known as: TOPAMAX Take 1 tablet (25 mg total) by mouth 2 (two) times daily. What changed:   how much to take  when to take this  additional instructions   Victoza 18 MG/3ML  Sopn Generic drug: liraglutide INJECT SUBCUTANEOUSLY 1.8MG  DAILY AT 12 NOON What changed: See the new instructions.   Vitamin D 125 MCG (5000 UT) Caps Take 5,000 Units by mouth 3 (three) times daily.       Allergies:  Allergies  Allergen Reactions  . Latex Other (See Comments)    Pt states he is not allergic to latex, and does not want it because his wife is allergic and If he gets it on him she breaks out.    Past Medical History:  Diagnosis Date  . Arthritis    Back  . Diabetes mellitus without complication (HCC)    Type II  . Fatty liver   . GERD (gastroesophageal reflux disease)   . History of kidney stones   . Hyperlipidemia   . Hypertension   . OSA on CPAP   . Pinched nerve in neck   . Seasonal allergies   . Varicose vein of leg    bilateral  . Wears glasses   . Wears hearing aid in both ears     Past Surgical History:  Procedure Laterality Date  . CHOLECYSTECTOMY    . COLONOSCOPY    . CYSTOSCOPY WITH RETROGRADE PYELOGRAM, URETEROSCOPY AND STENT PLACEMENT Left 03/19/2019   Procedure: CYSTOSCOPY WITH LEFT  RETROGRADE URETEROSCOPY WITH HOLMIUM LASER ANDPOSSIBLE STENT STENT PLACEMENT;  Surgeon: Bjorn PippinWrenn, John, MD;  [facility] [place];  Service: Urology;  Laterality: Left;  . FRACTURE SURGERY Right    Arm childhood x2  . KNEE ARTHROSCOPY Left   . NASAL SINUS SURGERY    . SHOULDER ARTHROSCOPY Right   . TONSILLECTOMY AND ADENOIDECTOMY     Childhood  . TRIGGER FINGER RELEASE Left     Family History  Problem Relation Age of Onset  . Cancer Mother   . Hypertension Mother   . Cancer Father   . Hypertension Father    . Hypertension Brother   . Diabetes Brother   . Diabetes Maternal Grandmother     Social History:  reports that he has never smoked. He has never used smokeless tobacco. He reports that he does not drink alcohol or use drugs.  Review of Systems:   HYPERTENSION:   Is well controlled with lisinopril 10mg   HYPERLIPIDEMIA:   Has good control with simvastatin 40 mg, triglycerides and LDL normal   Lab Results  Component Value Date   CHOL 119 08/19/2018   HDL 37.80 (L) 08/19/2018   LDLCALC 68 08/19/2018   LDLDIRECT 91.1 05/27/2014   TRIG 66.0 08/19/2018   CHOLHDL 3 08/19/2018     Lab Results  Component Value Date   ALT 25 03/18/2019    Diabetic foot exam in 11/18 shows normal monofilament sensation in the toes and plantar surfaces, no skin lesions or ulcers on the feet and normal pedal pulses     Examination:   BP 136/70 (BP Location: Left Arm, Patient Position: Sitting, Cuff Size: Normal)   Pulse 94   Ht 6\' 1"  (1.854 m)   Wt (!) 313 lb 9.6 oz (142.2 kg)   SpO2 98%   BMI 41.37 kg/m   Body mass index is 41.37 kg/m.      ASSESSMENT/ PLAN:  Diabetes type 2 with morbid obesity  See history of present illness for detailed discussion of current diabetes management, blood sugar patterns and problems identified   A1c fairly good at 6.9 compared to 6.8  His level of control and weight have stabilized with using his weight loss therapies of  phentermine and Topamax  He is compliant with his Victoza, Amaryl and metformin Now only on 2 mg Amaryl instead of 4 mg in the past  Discussed that he can do better with checking his blood sugars after meals to help him with consistent diet compliance He was shown how to stop his smart phone to read his blood sugars Again explained that his blood sugars are not being recorded in his freestyle libre unless he checks 3 times a day at least  No change in medications Follow-up in 4 months  Influenza vaccine given  There are no  Patient Instructions on file for this visit.   Elayne Snare 03/25/2019, 9:52 AM

## 2019-03-25 NOTE — Patient Instructions (Signed)
Check blood sugars on waking up days a week  Also check blood sugars about 2 hours after meals and do this after different meals by rotation  Recommended blood sugar levels on waking up are 90-130 and about 2 hours after meal is 130-160  Please bring your blood sugar monitor to each visit, thank you  More walking

## 2019-04-08 ENCOUNTER — Other Ambulatory Visit: Payer: Self-pay | Admitting: Endocrinology

## 2019-04-08 NOTE — Telephone Encounter (Signed)
Okay to refill, if this is a controlled substance I will have to do it tomorrow

## 2019-04-08 NOTE — Telephone Encounter (Signed)
Please advise if refill request is appropriate 

## 2019-04-08 NOTE — Telephone Encounter (Signed)
This is a controlled substance so I am re-sending to you as a reminder. Thank you

## 2019-05-07 ENCOUNTER — Other Ambulatory Visit: Payer: Self-pay | Admitting: Endocrinology

## 2019-05-09 ENCOUNTER — Other Ambulatory Visit: Payer: Self-pay | Admitting: Endocrinology

## 2019-05-11 ENCOUNTER — Other Ambulatory Visit: Payer: Self-pay | Admitting: Endocrinology

## 2019-07-24 ENCOUNTER — Other Ambulatory Visit (INDEPENDENT_AMBULATORY_CARE_PROVIDER_SITE_OTHER): Payer: BC Managed Care – PPO

## 2019-07-24 DIAGNOSIS — E782 Mixed hyperlipidemia: Secondary | ICD-10-CM

## 2019-07-24 DIAGNOSIS — E669 Obesity, unspecified: Secondary | ICD-10-CM | POA: Diagnosis not present

## 2019-07-24 DIAGNOSIS — E1169 Type 2 diabetes mellitus with other specified complication: Secondary | ICD-10-CM

## 2019-07-24 LAB — LIPID PANEL
Cholesterol: 103 mg/dL (ref 0–200)
HDL: 40.5 mg/dL (ref >39.00)
LDL Cholesterol: 54 mg/dL (ref 0–99)
NonHDL: 62.74
Total CHOL/HDL Ratio: 3
Triglycerides: 45 mg/dL (ref 0.0–149.0)
VLDL: 9 mg/dL (ref 0.0–40.0)

## 2019-07-24 LAB — MICROALBUMIN / CREATININE URINE RATIO
Creatinine,U: 267.7 mg/dL
Microalb Creat Ratio: 1.4 mg/g (ref 0.0–30.0)
Microalb, Ur: 3.8 mg/dL — ABNORMAL HIGH (ref 0.0–1.9)

## 2019-07-24 LAB — COMPREHENSIVE METABOLIC PANEL
ALT: 25 U/L (ref 0–53)
AST: 29 U/L (ref 0–37)
Albumin: 3.7 g/dL (ref 3.5–5.2)
Alkaline Phosphatase: 69 U/L (ref 39–117)
BUN: 12 mg/dL (ref 6–23)
CO2: 27 mEq/L (ref 19–32)
Calcium: 8.7 mg/dL (ref 8.4–10.5)
Chloride: 104 mEq/L (ref 96–112)
Creatinine, Ser: 0.86 mg/dL (ref 0.40–1.50)
GFR: 90.72 mL/min (ref >60.00)
Glucose, Bld: 124 mg/dL — ABNORMAL HIGH (ref 70–99)
Potassium: 4 mEq/L (ref 3.5–5.1)
Sodium: 138 mEq/L (ref 135–145)
Total Bilirubin: 2 mg/dL — ABNORMAL HIGH (ref 0.2–1.2)
Total Protein: 6.5 g/dL (ref 6.0–8.3)

## 2019-07-24 LAB — URINALYSIS, ROUTINE W REFLEX MICROSCOPIC
Hgb urine dipstick: NEGATIVE
Ketones, ur: NEGATIVE
Leukocytes,Ua: NEGATIVE
Nitrite: NEGATIVE
RBC / HPF: NONE SEEN (ref 0–?)
Specific Gravity, Urine: >=1.03 — AB (ref 1.000–1.030)
Total Protein, Urine: 30 — AB
Urine Glucose: 250 — AB
Urobilinogen, UA: 4 — AB (ref 0.0–1.0)
WBC, UA: NONE SEEN (ref 0–?)
pH: 5.5 (ref 5.0–8.0)

## 2019-07-24 LAB — HEMOGLOBIN A1C: Hgb A1c MFr Bld: 7.2 % — ABNORMAL HIGH (ref 4.6–6.5)

## 2019-07-27 ENCOUNTER — Ambulatory Visit: Payer: BC Managed Care – PPO | Admitting: Endocrinology

## 2019-07-29 ENCOUNTER — Ambulatory Visit (INDEPENDENT_AMBULATORY_CARE_PROVIDER_SITE_OTHER): Payer: BC Managed Care – PPO | Admitting: Endocrinology

## 2019-07-29 ENCOUNTER — Encounter: Payer: Self-pay | Admitting: Endocrinology

## 2019-07-29 ENCOUNTER — Ambulatory Visit: Payer: BC Managed Care – PPO | Admitting: Endocrinology

## 2019-07-29 ENCOUNTER — Other Ambulatory Visit: Payer: Self-pay

## 2019-07-29 DIAGNOSIS — E1165 Type 2 diabetes mellitus with hyperglycemia: Secondary | ICD-10-CM | POA: Diagnosis not present

## 2019-07-29 DIAGNOSIS — E782 Mixed hyperlipidemia: Secondary | ICD-10-CM

## 2019-07-29 NOTE — Progress Notes (Addendum)
Patient ID: Darryl Hunt, male   DOB: August 08, 1960, 59 y.o.   MRN: [id number]  Today's office visit was provided via telemedicine using a telephone call to the patient Patient has been explained the limitations of evaluation and management by telemedicine and the availability of in person appointments.  The patient understood the limitations and agreed to proceed. Patient also understood that the telehealth visit is billable. . Location of the patient: Home . Location of the provider: Office Only the patient and myself were participating in the encounter   Reason for Appointment: follow-up   History of Present Illness   Diagnosis: Type 2 DIABETES MELITUS, date of diagnosis: 2010        PREVIOUS history: He has been treated previously with regimen of metformin, glipizide and subsequently Victoza Overall has had fairly good control and glipizide was stopped because of tendency to hypoglycemia His A1c in the past has ranged from 6.7-7%, was 7% in 4/14 He was started on Invokana in 8/14; with this he started losing weight and blood sugars were consistently improved Because of recurrent balanitis he stopped this on his visit in 8/16  RECENT history:   He is on a regimen of Victoza 1.8 mg in am , Amaryl 2 mg at bedtime. and metformin 2 g q a day  A1c has risen to 7.2, was 6.9   Current management, blood sugar patterns and problems identified:  He has not been able to keep his diet controlled since he went off phentermine and Topamax a few weeks ago  He was told by the urologist that he was getting kidney stones from these medications and he has not taken the  He is eating more carbohydrates especially in the evenings and blood sugars are frequently over 200 at night  However he is still not checking his blood sugars enough and only occasionally after dinner  CGM patterns are discussed below  He also says his sugars going up much more after eating cereal in the  morning  Has not done any exercise, previously was walking more at work  Occasionally however blood sugar may be low normal early morning or before dinnertime  Has been regular with his Victoza  He is unclear if his weight has gone up or down recently   Side effects from medications: balanitis from Invokana.  GLUCOSE MONITORING results and analysis from freestyle libre:   CONTINUOUS GLUCOSE MONITORING RECORD INTERPRETATION    Dates of Recording: 1/7 through 1/20  Sensor description: Freestyle libre  Results statistics:   CGM use % of time  45  Average and SD  153, GV 30  Time in range     76   %  % Time Above 180  18  % Time above 250  4  % Time Below target  2    PRE-MEAL Fasting Lunch Dinner Bedtime Overall  Glucose range:       Mean/median:  149  156  128   153   POST-MEAL PC Breakfast PC Lunch PC Dinner  Glucose range:     Mean/median:  183  134  226    Glycemic patterns summary: Blood sugar data is incomplete after about 7 PM until midnight due to lack of monitoring Frequency of monitoring on a daily basis is quite variable Blood sugars tend to be variably high after breakfast and dinner Low sugars are minimal and only occasionally may be low normal around 5 AM or before dinner  Hyperglycemic episodes are  occurring variably after breakfast and after dinner  Hypoglycemic episodes have been minimal and transient either before dinnertime and once early morning  Overnight periods: Blood sugars are variable at mid night and then decrease until about 5-6 AM but usually above 130 consistently  Preprandial periods: Blood sugars are mildly increased fasting but variable At lunchtime blood sugars are on an average relatively higher but not consistent Blood sugars are averaging about 130 around dinnertime although low normal only once and usually not over 150  Postprandial periods:   After breakfast: Blood sugars are variably high and although averaging 180 at the  peak have been as high as 275     After lunch:   Blood sugars appear to be well controlled without any rise, usually blood sugar is relatively higher before the meal  After dinner: Blood sugar not being documented much but most of the blood sugar is available appear to be over 180   PREVIOUS data:  PRE-MEAL Fasting Lunch Dinner Bedtime Overall  Glucose range:  94-172      Mean/median:  102  129  115  142  128       Wt Readings from Last 3 Encounters:  03/25/19 (!) 313 lb 9.6 oz (142.2 kg)  03/19/19 (!) 309 lb 15.5 oz (140.6 kg)  11/25/18 (!) 311 lb (141.1 kg)   Lab Results  Component Value Date   HGBA1C 7.2 (H) 07/24/2019   HGBA1C 6.9 (H) 03/18/2019   HGBA1C 6.8 (H) 11/20/2018   Lab Results  Component Value Date   MICROALBUR 3.8 (H) 07/24/2019   LDLCALC 54 07/24/2019   CREATININE 0.86 07/24/2019    OTHER active problems: See review of systems   Lab on 07/24/2019  Component Date Value Ref Range Status  . Cholesterol 07/24/2019 103  0 - 200 mg/dL Final   ATP III Classification       Desirable:  < 200 mg/dL               Borderline High:  200 - 239 mg/dL          High:  > = 161240 mg/dL  . Triglycerides 07/24/2019 45.0  0.0 - 149.0 mg/dL Final   Normal:  <096<150 mg/dLBorderline High:  150 - 199 mg/dL  . HDL 07/24/2019 40.50  >39.00 mg/dL Final  . VLDL 04/54/098101/15/2021 9.0  0.0 - 19.140.0 mg/dL Final  . LDL Cholesterol 07/24/2019 54  0 - 99 mg/dL Final  . Total CHOL/HDL Ratio 07/24/2019 3   Final                  Men          Women1/2 Average Risk     3.4          3.3Average Risk          5.0          4.42X Average Risk          9.6          7.13X Average Risk          15.0          11.0                      . NonHDL 07/24/2019 62.74   Final   NOTE:  Non-HDL goal should be 30 mg/dL higher than patient's LDL goal (i.e. LDL goal of < 70 mg/dL, would have non-HDL goal of < 100 mg/dL)  .  Color, Urine 07/24/2019 YELLOW  Yellow;Lt. Yellow;Straw;Dark Yellow;Amber;Green;Red;Brown Final  .  APPearance 07/24/2019 CLEAR  Clear;Turbid;Slightly Cloudy;Cloudy Final  . Specific Gravity, Urine 07/24/2019 >=1.030* 1.000 - 1.030 Final  . pH 07/24/2019 5.5  5.0 - 8.0 Final  . Total Protein, Urine 07/24/2019 30* Negative Final  . Urine Glucose 07/24/2019 250* Negative Final  . Ketones, ur 07/24/2019 NEGATIVE  Negative Final  . Bilirubin Urine 07/24/2019 LARGE* Negative Final  . Hgb urine dipstick 07/24/2019 NEGATIVE  Negative Final  . Urobilinogen, UA 07/24/2019 4.0* 0.0 - 1.0 Final  . Leukocytes,Ua 07/24/2019 NEGATIVE  Negative Final  . Nitrite 07/24/2019 NEGATIVE  Negative Final  . WBC, UA 07/24/2019 none seen  0-2/hpf Final  . RBC / HPF 07/24/2019 none seen  0-2/hpf Final  . Squamous Epithelial / LPF 07/24/2019 Rare(0-4/hpf)  Rare(0-4/hpf) Final  . Bacteria, UA 07/24/2019 Rare(<10/hpf)* None Final  . Ca Oxalate Crys, UA 07/24/2019 Presence of* None Final  . Microalb, Ur 07/24/2019 3.8* 0.0 - 1.9 mg/dL Final  . Creatinine,U 96/10/5407 267.7  mg/dL Final  . Microalb Creat Ratio 07/24/2019 1.4  0.0 - 30.0 mg/g Final  . Sodium 07/24/2019 138  135 - 145 mEq/L Final  . Potassium 07/24/2019 4.0  3.5 - 5.1 mEq/L Final  . Chloride 07/24/2019 104  96 - 112 mEq/L Final  . CO2 07/24/2019 27  19 - 32 mEq/L Final  . Glucose, Bld 07/24/2019 124* 70 - 99 mg/dL Final  . BUN 81/19/1478 12  6 - 23 mg/dL Final  . Creatinine, Ser 07/24/2019 0.86  0.40 - 1.50 mg/dL Final  . Total Bilirubin 07/24/2019 2.0* 0.2 - 1.2 mg/dL Final  . Alkaline Phosphatase 07/24/2019 69  39 - 117 U/L Final  . AST 07/24/2019 29  0 - 37 U/L Final  . ALT 07/24/2019 25  0 - 53 U/L Final  . Total Protein 07/24/2019 6.5  6.0 - 8.3 g/dL Final  . Albumin 29/56/2130 3.7  3.5 - 5.2 g/dL Final  . GFR 86/57/8469 90.72  >60.00 mL/min Final  . Calcium 07/24/2019 8.7  8.4 - 10.5 mg/dL Final  . Hgb G2X MFr Bld 07/24/2019 7.2* 4.6 - 6.5 % Final   Glycemic Control Guidelines for People with Diabetes:Non Diabetic:  <6%Goal of Therapy:  <7%Additional Action Suggested:  >8%     Allergies as of 07/29/2019      Reactions   Latex Other (See Comments)   Pt states he is not allergic to latex, and does not want it because his wife is allergic and If he gets it on him she breaks out.      Medication List       Accurate as of July 29, 2019 11:21 AM. If you have any questions, ask your nurse or doctor.        cyclobenzaprine 10 MG tablet Commonly known as: FLEXERIL Take 10 mg by mouth at bedtime.   etodolac 300 MG capsule Commonly known as: LODINE Take 300 mg by mouth 2 (two) times daily. Take 1 tablet by mouth twice daily.   Fish Oil Ultra 1400 MG Caps Take 2,800 mg by mouth 2 (two) times daily.   FreeStyle Libre 14 Day Reader [name] 1 Device by Does not apply route as directed.   FreeStyle Libre 14 Day Sensor Misc APPLY 1 SENSOR TO UPPER ARM - REMOVE AND REPLACE EVERY 14 DAYS   glimepiride 4 MG tablet Commonly known as: AMARYL TAKE 1/2 TABLET BY MOUTH TWICE A DAY   Insulin Pen Needle 32G  X 4 MM Misc Use 1 needle daily.   levocetirizine 5 MG tablet Commonly known as: XYZAL Take 5 mg by mouth every evening.   lisinopril 10 MG tablet Commonly known as: ZESTRIL TAKE 1 TABLET BY MOUTH  DAILY What changed:   how much to take  how to take this  when to take this  additional instructions   metFORMIN 1000 MG tablet Commonly known as: GLUCOPHAGE TAKE 1 TABLET BY MOUTH TWO  TIMES DAILY What changed: when to take this   mometasone 50 MCG/ACT nasal spray Commonly known as: NASONEX Place 2 sprays into the nose daily as needed (allergies).   omeprazole 20 MG capsule Commonly known as: PRILOSEC Take 20 mg by mouth daily.   phenazopyridine 200 MG tablet Commonly known as: Pyridium Take 1 tablet (200 mg total) by mouth 3 (three) times daily as needed for pain.   phentermine 15 MG capsule Take 1 capsule by mouth once daily in the morning   simvastatin 40 MG tablet Commonly known as: ZOCOR TAKE 1  TABLET BY MOUTH  DAILY What changed: when to take this   topiramate 25 MG tablet Commonly known as: TOPAMAX TAKE 1 TABLET BY MOUTH TWICE A DAY   Victoza 18 MG/3ML Sopn Generic drug: liraglutide INJECT SUBCUTANEOUSLY 1.8MG  DAILY AT 12 NOON What changed: See the new instructions.   Vitamin D 125 MCG (5000 UT) Caps Take 5,000 Units by mouth 3 (three) times daily.       Allergies:  Allergies  Allergen Reactions  . Latex Other (See Comments)    Pt states he is not allergic to latex, and does not want it because his wife is allergic and If he gets it on him she breaks out.    Past Medical History:  Diagnosis Date  . Arthritis    Back  . Diabetes mellitus without complication ([place])    Type II  . Fatty liver   . GERD (gastroesophageal reflux disease)   . History of kidney stones   . Hyperlipidemia   . Hypertension   . OSA on CPAP   . Pinched nerve in neck   . Seasonal allergies   . Varicose vein of leg    bilateral  . Wears glasses   . Wears hearing aid in both ears     Past Surgical History:  Procedure Laterality Date  . CHOLECYSTECTOMY    . COLONOSCOPY    . CYSTOSCOPY WITH RETROGRADE PYELOGRAM, URETEROSCOPY AND STENT PLACEMENT Left 03/19/2019   Procedure: CYSTOSCOPY WITH LEFT  RETROGRADE URETEROSCOPY WITH HOLMIUM LASER ANDPOSSIBLE STENT STENT PLACEMENT;  Surgeon: [name], MD;  Location: [facility];  Service: Urology;  Laterality: Left;  . FRACTURE SURGERY Right    Arm childhood x2  . KNEE ARTHROSCOPY Left   . NASAL SINUS SURGERY    . SHOULDER ARTHROSCOPY Right   . TONSILLECTOMY AND ADENOIDECTOMY     Childhood  . TRIGGER FINGER RELEASE Left     Family History  Problem Relation Age of Onset  . Cancer Mother   . Hypertension Mother   . Cancer Father   . Hypertension Father   . Hypertension Brother   . Diabetes Brother   . Diabetes Maternal Grandmother     Social History:  reports that he has never smoked. He has never used smokeless  tobacco. He reports that he does not drink alcohol or use drugs.  Review of Systems:   HYPERTENSION:  Has been controlled with lisinopril 10mg   BP  Readings from Last 3 Encounters:  03/25/19 136/70  03/19/19 (!) 142/71  11/25/18 110/72     HYPERLIPIDEMIA:   Has good control with simvastatin 40 mg, triglycerides and LDL normal   Lab Results  Component Value Date   CHOL 103 07/24/2019   HDL 40.50 07/24/2019   LDLCALC 54 07/24/2019   LDLDIRECT 91.1 05/27/2014   TRIG 45.0 07/24/2019   CHOLHDL 3 07/24/2019     Lab Results  Component Value Date   ALT 25 07/24/2019    Diabetic foot exam in 11/18 shows normal monofilament sensation in the toes and plantar surfaces, no skin lesions or ulcers on the feet and normal pedal pulses  He has had a kidney stone but not clear what the composition is    Examination:   There were no vitals taken for this visit.  There is no height or weight on file to calculate BMI.      ASSESSMENT/ PLAN:  Diabetes type 2 with morbid obesity  See history of present illness for detailed discussion of current diabetes management, blood sugar patterns and problems identified   A1c is 7.2, relatively higher  His blood sugars after meals are trending to be higher especially after dinner He is not able to control his diet with stopping phentermine and Topamax This is despite using Victoza  Discussed that he needs to at least get back on phentermine for appetite control since this had been working safely without side effects for him He may potentially have alteration of urine pH with Topamax causing his kidney stones and can hold off on this Otherwise may have to consider Contrave which may or may not be tolerated Does need significant weight loss, last BMI was about 41  Reminded him to check blood sugars consistently after dinner to help him modify his diet Also he was recommended to avoid cereal in the morning and  have a protein and low  carbohydrate meal Unlikely that he can safely increase his glimepiride since he may have low sugars with this especially if he starts improving his diet and exercising  Will need foot exam on his next visit if he comes into the office  LIPIDS: Well-controlled  Duration of telephone encounter 13 minutes  There are no Patient Instructions on file for this visit.   Reather Littler 07/29/2019, 11:21 AM

## 2019-07-30 MED ORDER — PHENTERMINE HCL 15 MG PO CAPS: ORAL_CAPSULE | ORAL | 3 refills | Status: DC

## 2019-10-08 ENCOUNTER — Other Ambulatory Visit: Payer: Self-pay | Admitting: Endocrinology

## 2019-10-21 ENCOUNTER — Other Ambulatory Visit: Payer: Self-pay

## 2019-10-21 ENCOUNTER — Other Ambulatory Visit (INDEPENDENT_AMBULATORY_CARE_PROVIDER_SITE_OTHER): Payer: BC Managed Care – PPO

## 2019-10-21 DIAGNOSIS — E1165 Type 2 diabetes mellitus with hyperglycemia: Secondary | ICD-10-CM

## 2019-10-21 LAB — COMPREHENSIVE METABOLIC PANEL
ALT: 24 U/L (ref 0–53)
AST: 26 U/L (ref 0–37)
Albumin: 3.8 g/dL (ref 3.5–5.2)
Alkaline Phosphatase: 64 U/L (ref 39–117)
BUN: 14 mg/dL (ref 6–23)
CO2: 27 mEq/L (ref 19–32)
Calcium: 8.9 mg/dL (ref 8.4–10.5)
Chloride: 102 mEq/L (ref 96–112)
Creatinine, Ser: 0.78 mg/dL (ref 0.40–1.50)
GFR: 101.45 mL/min (ref >60.00)
Glucose, Bld: 150 mg/dL — ABNORMAL HIGH (ref 70–99)
Potassium: 4 mEq/L (ref 3.5–5.1)
Sodium: 137 mEq/L (ref 135–145)
Total Bilirubin: 2.1 mg/dL — ABNORMAL HIGH (ref 0.2–1.2)
Total Protein: 6.5 g/dL (ref 6.0–8.3)

## 2019-10-21 LAB — HEMOGLOBIN A1C: Hgb A1c MFr Bld: 7.6 % — ABNORMAL HIGH (ref 4.6–6.5)

## 2019-10-22 ENCOUNTER — Other Ambulatory Visit: Payer: BC Managed Care – PPO

## 2019-10-23 ENCOUNTER — Other Ambulatory Visit: Payer: Self-pay

## 2019-10-27 ENCOUNTER — Encounter: Payer: Self-pay | Admitting: Endocrinology

## 2019-10-27 ENCOUNTER — Ambulatory Visit: Payer: BC Managed Care – PPO | Admitting: Endocrinology

## 2019-10-27 ENCOUNTER — Other Ambulatory Visit: Payer: Self-pay

## 2019-10-27 VITALS — BP 126/68 | HR 92 | Ht 73.0 in | Wt 312.4 lb

## 2019-10-27 DIAGNOSIS — E1165 Type 2 diabetes mellitus with hyperglycemia: Secondary | ICD-10-CM | POA: Diagnosis not present

## 2019-10-27 DIAGNOSIS — I10 Essential (primary) hypertension: Secondary | ICD-10-CM

## 2019-10-27 LAB — GLUCOSE, POCT (MANUAL RESULT ENTRY): POC Glucose: 165 mg/dl — AB (ref 70–99)

## 2019-10-27 MED ORDER — FREESTYLE LIBRE 2 READER DEVI: 1.0000 | Freq: Once | 0 refills | Status: AC

## 2019-10-27 MED ORDER — NALTREXONE-BUPROPION HCL ER 8-90 MG PO TB12: ORAL_TABLET | ORAL | 2 refills | Status: DC

## 2019-10-27 MED ORDER — FREESTYLE LIBRE 2 SENSOR MISC: 2.0000 | 3 refills | Status: DC

## 2019-10-27 NOTE — Patient Instructions (Signed)
Check copay card for Contrave

## 2019-10-27 NOTE — Progress Notes (Signed)
Patient ID: Darryl Hunt, male   DOB: 1961/03/10, 59 y.o.   MRN: [id number]    Reason for Appointment: follow-up   History of Present Illness   Diagnosis: Type 2 DIABETES MELITUS, date of diagnosis: 2010        PREVIOUS history: He has been treated previously with regimen of metformin, glipizide and subsequently Victoza Overall has had fairly good control and glipizide was stopped because of tendency to hypoglycemia His A1c in the past has ranged from 6.7-7%, was 7% in 4/14 He was started on Invokana in 8/14; with this he started losing weight and blood sugars were consistently improved Because of recurrent balanitis he stopped this on his visit in 8/16  RECENT history:   He is on a regimen of Victoza 1.8 mg in am , Amaryl 2 mg at bedtime. and metformin 2 g q a day  A1c has gone up further to 7.6 compared to 7.2   Current management, blood sugar patterns and problems identified:  He has had some higher sugars earlier this year because of having Covid pneumonia and getting steroids  Also without taking Topamax he thinks he is not able to control his diet as well with portions and snacks  Blood sugars are at home on an average about the same as before recently  Today his CGM appears to be reading about 20 mg lower than the actual reading  Blood sugars from his CGM appear to be about the same throughout the day although still not checking enough and frequently not checking after dinner  Still have a few sporadic high readings after some of his meals with no consistent pattern  He has not been exercising consistently and only recently started to be a little more active  His weight is about the same as last year   Side effects from medications: balanitis from [place].  GLUCOSE MONITORING results and analysis from freestyle libre:  CGM use % of time  54  2-week average/SD  158, GV 21  Time in range     78   %  % Time Above 180  21  % Time above 250   %  Time Below 70  0     PRE-MEAL Fasting Lunch Dinner Bedtime Overall  Glucose range:       Averages:  147  159  153     POST-MEAL PC Breakfast PC Lunch PC Dinner  Glucose range:     Averages:  180  162  162    Previous data:    CGM use % of time  45  Average and SD  153, GV 30  Time in range     76   %  % Time Above 180  18  % Time above 250  4  % Time Below target  2    PRE-MEAL Fasting Lunch Dinner Bedtime Overall  Glucose range:       Mean/median:  149  156  128   153   POST-MEAL PC Breakfast PC Lunch PC Dinner  Glucose range:     Mean/median:  183  134  226       Wt Readings from Last 3 Encounters:  10/27/19 (!) 312 lb 6.4 oz (141.7 kg)  03/25/19 (!) 313 lb 9.6 oz (142.2 kg)  03/19/19 (!) 309 lb 15.5 oz (140.6 kg)   Lab Results  Component Value Date   HGBA1C 7.6 (H) 10/21/2019   HGBA1C 7.2 (H) 07/24/2019   HGBA1C  6.9 (H) 03/18/2019   Lab Results  Component Value Date   MICROALBUR 3.8 (H) 07/24/2019   LDLCALC 54 07/24/2019   CREATININE 0.78 10/21/2019    OTHER active problems: See review of systems   Office Visit on 10/27/2019  Component Date Value Ref Range Status  . POC Glucose 10/27/2019 165* 70 - 99 mg/dl Final  Lab on 82/12/154  Component Date Value Ref Range Status  . Sodium 10/21/2019 137  135 - 145 mEq/L Final  . Potassium 10/21/2019 4.0  3.5 - 5.1 mEq/L Final  . Chloride 10/21/2019 102  96 - 112 mEq/L Final  . CO2 10/21/2019 27  19 - 32 mEq/L Final  . Glucose, Bld 10/21/2019 150* 70 - 99 mg/dL Final  . BUN 15/37/9432 14  6 - 23 mg/dL Final  . Creatinine, Ser 10/21/2019 0.78  0.40 - 1.50 mg/dL Final  . Total Bilirubin 10/21/2019 2.1* 0.2 - 1.2 mg/dL Final  . Alkaline Phosphatase 10/21/2019 64  39 - 117 U/L Final  . AST 10/21/2019 26  0 - 37 U/L Final  . ALT 10/21/2019 24  0 - 53 U/L Final  . Total Protein 10/21/2019 6.5  6.0 - 8.3 g/dL Final  . Albumin 76/14/7092 3.8  3.5 - 5.2 g/dL Final  . GFR 95/74/7340 101.45  >60.00 mL/min Final   . Calcium 10/21/2019 8.9  8.4 - 10.5 mg/dL Final  . Hgb Z7Q MFr Bld 10/21/2019 7.6* 4.6 - 6.5 % Final   Glycemic Control Guidelines for People with Diabetes:Non Diabetic:  <6%Goal of Therapy: <7%Additional Action Suggested:  >8%     Allergies as of 10/27/2019      Reactions   Latex Other (See Comments)   Pt states he is not allergic to latex, and does not want it because his wife is allergic and If he gets it on him she breaks out.      Medication List       Accurate as of October 27, 2019 12:29 PM. If you have any questions, ask your nurse or doctor.        STOP taking these medications   cyclobenzaprine 10 MG tablet Commonly known as: FLEXERIL Stopped by: Reather Littler, MD   phenazopyridine 200 MG tablet Commonly known as: Pyridium Stopped by: Reather Littler, MD   topiramate 25 MG tablet Commonly known as: TOPAMAX Stopped by: Reather Littler, MD     TAKE these medications   etodolac 300 MG capsule Commonly known as: LODINE Take 300 mg by mouth 2 (two) times daily. Take 1 tablet by mouth twice daily.   Fish Oil Ultra 1400 MG Caps Take 2,800 mg by mouth 2 (two) times daily. Take 2 capsules by mouth twice daily.   FreeStyle Libre 14 Day Reader [name] 1 Device by Does not apply route as directed. What changed: Another medication with the same name was added. Make sure you understand how and when to take each. Changed by: Reather Littler, MD   FreeStyle Libre 2 Reader [facility] 1 Device by Does not apply route once for 1 dose. What changed: You were already taking a medication with the same name, and this prescription was added. Make sure you understand how and when to take each. Changed by: Reather Littler, MD   FreeStyle Libre 14 Day Sensor Misc APPLY 1 SENSOR TO UPPER ARM - REMOVE AND REPLACE EVERY 14 DAYS What changed: Another medication with the same name was added. Make sure you understand how and when to take each. Changed by:  Reather Littler, MD   FreeStyle Libre 2 Sensor Misc 2 Devices by  Does not apply route every 14 (fourteen) days. What changed: You were already taking a medication with the same name, and this prescription was added. Make sure you understand how and when to take each. Changed by: Reather Littler, MD   glimepiride 4 MG tablet Commonly known as: AMARYL TAKE 1/2 TABLET BY MOUTH TWICE A DAY   Insulin Pen Needle 32G X 4 MM Misc Use 1 needle daily.   levocetirizine 5 MG tablet Commonly known as: XYZAL Take 5 mg by mouth every evening.   lisinopril 10 MG tablet Commonly known as: ZESTRIL TAKE 1 TABLET BY MOUTH  DAILY What changed:   how much to take  how to take this  when to take this  additional instructions   metFORMIN 1000 MG tablet Commonly known as: GLUCOPHAGE TAKE 1 TABLET BY MOUTH TWO  TIMES DAILY What changed: when to take this   mometasone 50 MCG/ACT nasal spray Commonly known as: NASONEX Place 2 sprays into the nose daily as needed (allergies).   Naltrexone-buPROPion HCl ER 8-90 MG Tb12 Start 1 tablet every morning for 7 days, then 1 tablet twice daily for 7 days, then 2 tablets every morning and one in the evening Started by: Reather Littler, MD   omeprazole 20 MG capsule Commonly known as: PRILOSEC Take 20 mg by mouth daily.   phentermine 15 MG capsule Take 1 capsule by mouth once daily in the morning   simvastatin 40 MG tablet Commonly known as: ZOCOR TAKE 1 TABLET BY MOUTH  DAILY What changed: when to take this   tamsulosin 0.4 MG Caps capsule Commonly known as: FLOMAX Take 0.4 mg by mouth daily after supper.   Victoza 18 MG/3ML Sopn Generic drug: liraglutide INJECT SUBCUTANEOUSLY 1.8MG  DAILY AT 12 NOON What changed: See the new instructions.   Vitamin D 125 MCG (5000 UT) Caps Take 5,000 Units by mouth 3 (three) times daily.       Allergies:  Allergies  Allergen Reactions  . Latex Other (See Comments)    Pt states he is not allergic to latex, and does not want it because his wife is allergic and If he gets it on  him she breaks out.    Past Medical History:  Diagnosis Date  . Arthritis    Back  . Diabetes mellitus without complication (HCC)    Type II  . Fatty liver   . GERD (gastroesophageal reflux disease)   . History of kidney stones   . Hyperlipidemia   . Hypertension   . OSA on CPAP   . Pinched nerve in neck   . Seasonal allergies   . Varicose vein of leg    bilateral  . Wears glasses   . Wears hearing aid in both ears     Past Surgical History:  Procedure Laterality Date  . CHOLECYSTECTOMY    . COLONOSCOPY    . CYSTOSCOPY WITH RETROGRADE PYELOGRAM, URETEROSCOPY AND STENT PLACEMENT Left 03/19/2019   Procedure: CYSTOSCOPY WITH LEFT  RETROGRADE URETEROSCOPY WITH HOLMIUM LASER ANDPOSSIBLE STENT STENT PLACEMENT;  Surgeon: Bjorn Pippin, MD;  Location: [facility];  Service: Urology;  Laterality: Left;  . FRACTURE SURGERY Right    Arm childhood x2  . KNEE ARTHROSCOPY Left   . NASAL SINUS SURGERY    . SHOULDER ARTHROSCOPY Right   . TONSILLECTOMY AND ADENOIDECTOMY     Childhood  . TRIGGER FINGER RELEASE Left  Family History  Problem Relation Age of Onset  . Cancer Mother   . Hypertension Mother   . Cancer Father   . Hypertension Father   . Hypertension Brother   . Diabetes Brother   . Diabetes Maternal Grandmother     Social History:  reports that he has never smoked. He has never used smokeless tobacco. He reports that he does not drink alcohol or use drugs.  Review of Systems:   HYPERTENSION:  Has been controlled with lisinopril 10mg   BP Readings from Last 3 Encounters:  10/27/19 126/68  03/25/19 136/70  03/19/19 (!) 142/71     HYPERLIPIDEMIA:   Has good control with simvastatin 40 mg, triglycerides and LDL normal   Lab Results  Component Value Date   CHOL 103 07/24/2019   HDL 40.50 07/24/2019   LDLCALC 54 07/24/2019   LDLDIRECT 91.1 05/27/2014   TRIG 45.0 07/24/2019   CHOLHDL 3 07/24/2019     Lab Results  Component Value Date    ALT 24 10/21/2019    Diabetic foot exam in 4/21, difficult to palpate pedal pulses   No further kidney stones recently    Examination:   BP 126/68 (BP Location: Left Arm, Patient Position: Sitting, Cuff Size: Large)   Pulse 92   Ht 6\' 1"  (1.854 m)   Wt (!) 312 lb 6.4 oz (141.7 kg)   SpO2 98%   BMI 41.22 kg/m   Body mass index is 41.22 kg/m.      ASSESSMENT/ PLAN:  Diabetes type 2 with morbid obesity  See history of present illness for detailed discussion of current diabetes management, blood sugar patterns and problems identified   A1c is 7.6, relatively higher  Currently no particular blood sugar patterns available from his freestyle libre but he is only having diarrhea for about 54% of the time Sporadically will have high readings after meals at different times He feels that he is not able to control his diet as well with not using Topamax which was stopped because of potential for kidney stones  Exercise regimen is inadequate currently and needs to try and do this more regularly In the meantime will try Contrave which likely can be covered by his insurance and discussed dosage regimen, possible side effects and titration Encouraged him to check blood sugars more often after meals especially after dinner Also needs to do fingerstick comparison regularly  Mild hypertension: Well-controlled    Patient Instructions  Check copay card for Contrave      5/21 10/27/2019, 12:29 PM

## 2019-11-04 ENCOUNTER — Other Ambulatory Visit: Payer: Self-pay | Admitting: Endocrinology

## 2019-11-05 ENCOUNTER — Other Ambulatory Visit: Payer: Self-pay | Admitting: Endocrinology

## 2019-11-05 ENCOUNTER — Telehealth: Payer: Self-pay | Admitting: Endocrinology

## 2019-11-05 MED ORDER — PHENTERMINE HCL 15 MG PO CAPS: ORAL_CAPSULE | ORAL | 3 refills | Status: DC

## 2019-11-05 NOTE — Telephone Encounter (Signed)
Medication Refill Request  Did you call your pharmacy and request this refill first? Yes   If patient has not contacted pharmacy first, instruct them to do so for future refills.   Remind them that contacting the pharmacy for their refill is the quickest method to get the refill.   Refill policy also stated that it will take anywhere between 24-72 hours to receive the refill.    Name of medication? Phentermine 37.50 MG rather than 15 MG   Is this a 90 day supply? yes  Name and location of pharmacy?  [facility] [address] [facility] [place], [place] - [address] [place] Phone:  [phone]  Fax:  [phone]

## 2019-11-05 NOTE — Telephone Encounter (Signed)
He was supposed to use Contrave instead

## 2019-11-05 NOTE — Telephone Encounter (Signed)
Please refill if appropriate

## 2019-11-05 NOTE — Telephone Encounter (Signed)
Pt stated that Contrave is too expensive and insurance company recommended Phentermine as alternative.

## 2019-11-09 ENCOUNTER — Telehealth: Payer: Self-pay

## 2019-11-09 NOTE — Telephone Encounter (Signed)
Need to do a PA.

## 2019-11-09 NOTE — Telephone Encounter (Signed)
PA initiated via [email or web address] for Phentermine 15mg  tablets.  Key: Rosine Beat  PA Case ID: [id number] Status Sent to Plan today Drug: Phentermine HCl 15MG  capsules Form: [employer] Electronic Prior Authorization Form (2017 NCPDP)

## 2019-11-09 NOTE — Telephone Encounter (Signed)
Received fax stating that Phentermine requires PA. Would you like to complete PA or change medication? Phentermine was prescribed because pt reported insurance suggested this as alternative to Contrave.

## 2020-01-21 ENCOUNTER — Other Ambulatory Visit: Payer: Self-pay

## 2020-01-21 ENCOUNTER — Other Ambulatory Visit (INDEPENDENT_AMBULATORY_CARE_PROVIDER_SITE_OTHER): Payer: BC Managed Care – PPO

## 2020-01-21 DIAGNOSIS — E1165 Type 2 diabetes mellitus with hyperglycemia: Secondary | ICD-10-CM | POA: Diagnosis not present

## 2020-01-21 LAB — BASIC METABOLIC PANEL WITH GFR
BUN: 10 mg/dL (ref 6–23)
CO2: 27 meq/L (ref 19–32)
Calcium: 8.9 mg/dL (ref 8.4–10.5)
Chloride: 104 meq/L (ref 96–112)
Creatinine, Ser: 0.8 mg/dL (ref 0.40–1.50)
GFR: 98.45 mL/min
Glucose, Bld: 86 mg/dL (ref 70–99)
Potassium: 4.1 meq/L (ref 3.5–5.1)
Sodium: 137 meq/L (ref 135–145)

## 2020-01-21 LAB — HEMOGLOBIN A1C: Hgb A1c MFr Bld: 7.2 % — ABNORMAL HIGH (ref 4.6–6.5)

## 2020-01-22 ENCOUNTER — Other Ambulatory Visit: Payer: BC Managed Care – PPO

## 2020-01-28 ENCOUNTER — Encounter: Payer: Self-pay | Admitting: Endocrinology

## 2020-01-28 ENCOUNTER — Ambulatory Visit (INDEPENDENT_AMBULATORY_CARE_PROVIDER_SITE_OTHER): Payer: BC Managed Care – PPO | Admitting: Endocrinology

## 2020-01-28 ENCOUNTER — Other Ambulatory Visit: Payer: Self-pay

## 2020-01-28 VITALS — BP 124/64 | HR 80 | Ht 73.0 in | Wt 321.4 lb

## 2020-01-28 DIAGNOSIS — E1165 Type 2 diabetes mellitus with hyperglycemia: Secondary | ICD-10-CM | POA: Diagnosis not present

## 2020-01-28 DIAGNOSIS — E782 Mixed hyperlipidemia: Secondary | ICD-10-CM | POA: Diagnosis not present

## 2020-01-28 MED ORDER — OZEMPIC (0.25 OR 0.5 MG/DOSE) 2 MG/1.5ML ~~LOC~~ SOPN: 0.5000 mg | PEN_INJECTOR | SUBCUTANEOUS | 2 refills | Status: DC

## 2020-01-28 NOTE — Patient Instructions (Addendum)
Regular exercise  Start OZEMPIC injections by dialing 0.25 mg on the pen as shown once weekly on the same day of the week.   You may inject in the sides of the stomach, outer thigh or arm as indicated in the brochure given. If you have any difficulties using the pen see the video at [email or web address]  You will feel fullness of the stomach with starting the medication and should try to keep the portions at meals small.  You may experience nausea in the first few days which usually gets better over time    After 2 weeks increase the dose to 0.5 mg weekly  If you have any questions or persistent side effects please call the office   You may also talk to a nurse educator with [employer] at [phone] Useful website: [email or web address]   Take phenteremine in pm

## 2020-01-28 NOTE — Progress Notes (Signed)
Patient ID: Darryl Hunt, male   DOB: [birth year], 59 y.o.   MRN: [id number]    Reason for Appointment: follow-up   History of Present Illness   Diagnosis: Type 2 DIABETES MELITUS, date of diagnosis: 2010        PREVIOUS history: He has been treated previously with regimen of metformin, glipizide and subsequently Victoza Overall has had fairly good control and glipizide was stopped because of tendency to hypoglycemia His A1c in the past has ranged from 6.7-7%, was 7% in 4/14 He was started on Invokana in 8/14; with this he started losing weight and blood sugars were consistently improved Because of recurrent balanitis he stopped this on his visit in 8/16  RECENT history:   He is on a regimen of Victoza 1.8 mg in am , Amaryl 2 mg at bedtime. and metformin 2 g q a day  A1c has gone  further to 7.6 compared to 7.2   Current management, blood sugar patterns and problems identified:  He has had episodes of postprandial hyperglycemia at breakfast and dinnertime although probably more in the evenings and after bedtime sometimes  Because of his intermittent glucose monitoring complete patterns are not available  HIGHEST blood sugars are evaluated with this data is 175 after breakfast  Okay  Blood sugar may be low normal or slightly low but he may be getting falsely low readings at times in the normal range; his glucose was 86 when his freestyle [name] was reading 62 without symptoms  Also no other symptoms of low sugars better  He is taking his phentermine in the morning but he thinks he has sometimes increased appetite in the evening  Not able to get Contrave covered by his insurance and this is too expensive for him  He has done a little more outside activities but no consistent formal exercise  He says that periodically he will have episodes of nausea or vomiting and he thinks this is from Victoza  His weight has gone up 9 pounds   Side effects from  medications: balanitis from Invokana.  GLUCOSE MONITORING results and analysis from freestyle libre:  CGM use % of time  50  2-week average/SD  150, GV 32  Time in range     77   %  % Time Above 180  17  % Time above 250   % Time Below 70 2     PRE-MEAL Fasting Lunch Dinner Bedtime Overall  Glucose range:       Averages:  154   133   150   POST-MEAL PC Breakfast PC Lunch PC Dinner  Glucose range:     Averages:  175  149  165     PREVIOUS readings:  CGM use % of time  54  2-week average/SD  158, GV 21  Time in range     78   %  % Time Above 180  21  % Time above 250   % Time Below 70  0     PRE-MEAL Fasting Lunch Dinner Bedtime Overall  Glucose range:       Averages:  147  159  153     POST-MEAL PC Breakfast PC Lunch PC Dinner  Glucose range:     Averages:  180  162  162     Wt Readings from Last 3 Encounters:  01/28/20 (!) 321 lb 6.4 oz (145.8 kg)  10/27/19 (!) 312 lb 6.4 oz (141.7 kg)  03/25/19 (!) 313  lb 9.6 oz (142.2 kg)   Lab Results  Component Value Date   HGBA1C 7.2 (H) 01/21/2020   HGBA1C 7.6 (H) 10/21/2019   HGBA1C 7.2 (H) 07/24/2019   Lab Results  Component Value Date   MICROALBUR 3.8 (H) 07/24/2019   LDLCALC 54 07/24/2019   CREATININE 0.80 01/21/2020    OTHER active problems: See review of systems   No visits with results within 1 Week(s) from this visit.  Latest known visit with results is:  Lab on 01/21/2020  Component Date Value Ref Range Status  . Sodium 01/21/2020 137  135 - 145 mEq/L Final  . Potassium 01/21/2020 4.1  3.5 - 5.1 mEq/L Final  . Chloride 01/21/2020 104  96 - 112 mEq/L Final  . CO2 01/21/2020 27  19 - 32 mEq/L Final  . Glucose, Bld 01/21/2020 86  70 - 99 mg/dL Final  . BUN 03/50/0938 10  6 - 23 mg/dL Final  . Creatinine, Ser 01/21/2020 0.80  0.40 - 1.50 mg/dL Final  . GFR 18/29/9371 98.45  >60.00 mL/min Final  . Calcium 01/21/2020 8.9  8.4 - 10.5 mg/dL Final  . Hgb I9C MFr Bld 01/21/2020 7.2* 4.6 - 6.5 % Final    Glycemic Control Guidelines for People with Diabetes:Non Diabetic:  <6%Goal of Therapy: <7%Additional Action Suggested:  >8%     Allergies as of 01/28/2020      Reactions   Latex Other (See Comments)   Pt states he is not allergic to latex, and does not want it because his wife is allergic and If he gets it on him she breaks out.      Medication List       Accurate as of January 28, 2020 11:03 AM. If you have any questions, ask your nurse or doctor.        etodolac 300 MG capsule Commonly known as: LODINE Take 300 mg by mouth 2 (two) times daily. Take 1 tablet by mouth twice daily.   Fish Oil Ultra 1400 MG Caps Take 2,800 mg by mouth 2 (two) times daily. Take 2 capsules by mouth twice daily.   FreeStyle Libre 14 Day Reader [name] 1 Device by Does not apply route as directed.   FreeStyle Libre 14 Day Sensor Misc APPLY 1 SENSOR TO UPPER ARM - REMOVE AND REPLACE EVERY 14 DAYS   FreeStyle Libre 2 Sensor Misc 2 Devices by Does not apply route every 14 (fourteen) days.   glimepiride 4 MG tablet Commonly known as: AMARYL TAKE 1/2 TABLET BY MOUTH TWICE A DAY   Insulin Pen Needle 32G X 4 MM Misc Use 1 needle daily.   levocetirizine 5 MG tablet Commonly known as: XYZAL Take 5 mg by mouth every evening.   lisinopril 10 MG tablet Commonly known as: ZESTRIL TAKE 1 TABLET BY MOUTH  DAILY What changed:   how much to take  how to take this  when to take this  additional instructions   metFORMIN 1000 MG tablet Commonly known as: GLUCOPHAGE TAKE 1 TABLET BY MOUTH TWO  TIMES DAILY What changed: when to take this   mometasone 50 MCG/ACT nasal spray Commonly known as: NASONEX Place 2 sprays into the nose daily as needed (allergies).   omeprazole 20 MG capsule Commonly known as: PRILOSEC Take 20 mg by mouth daily.   phentermine 15 MG capsule Take 1 capsule by mouth once daily in the morning   simvastatin 40 MG tablet Commonly known as: ZOCOR TAKE 1 TABLET BY MOUTH  DAILY What changed: when to take this   tamsulosin 0.4 MG Caps capsule Commonly known as: FLOMAX Take 0.4 mg by mouth daily after supper.   Victoza 18 MG/3ML Sopn Generic drug: liraglutide INJECT SUBCUTANEOUSLY 1.8MG  DAILY AT 12 NOON   Vitamin D 125 MCG (5000 UT) Caps Take 5,000 Units by mouth 3 (three) times daily.       Allergies:  Allergies  Allergen Reactions  . Latex Other (See Comments)    Pt states he is not allergic to latex, and does not want it because his wife is allergic and If he gets it on him she breaks out.    Past Medical History:  Diagnosis Date  . Arthritis    Back  . Diabetes mellitus without complication (HCC)    Type II  . Fatty liver   . GERD (gastroesophageal reflux disease)   . History of kidney stones   . Hyperlipidemia   . Hypertension   . OSA on CPAP   . Pinched nerve in neck   . Seasonal allergies   . Varicose vein of leg    bilateral  . Wears glasses   . Wears hearing aid in both ears     Past Surgical History:  Procedure Laterality Date  . CHOLECYSTECTOMY    . COLONOSCOPY    . CYSTOSCOPY WITH RETROGRADE PYELOGRAM, URETEROSCOPY AND STENT PLACEMENT Left 03/19/2019   Procedure: CYSTOSCOPY WITH LEFT  RETROGRADE URETEROSCOPY WITH HOLMIUM LASER ANDPOSSIBLE STENT STENT PLACEMENT;  Surgeon: Bjorn Pippin, MD;  [facility]  Service: Urology;  Laterality: Left;  . FRACTURE SURGERY Right    Arm childhood x2  . KNEE ARTHROSCOPY Left   . NASAL SINUS SURGERY    . SHOULDER ARTHROSCOPY Right   . TONSILLECTOMY AND ADENOIDECTOMY     Childhood  . TRIGGER FINGER RELEASE Left     Family History  Problem Relation Age of Onset  . Cancer Mother   . Hypertension Mother   . Cancer Father   . Hypertension Father   . Hypertension Brother   . Diabetes Brother   . Diabetes Maternal Grandmother     Social History:  reports that he has never smoked. He has never used smokeless tobacco. He reports that he does not drink  alcohol and does not use drugs.  Review of Systems:   HYPERTENSION:  Has been controlled with lisinopril 10mg   BP Readings from Last 3 Encounters:  01/28/20 (!) 124/64  10/27/19 126/68  03/25/19 136/70    HYPERLIPIDEMIA:   Has good control with simvastatin 40 mg, triglycerides and LDL normal   Lab Results  Component Value Date   CHOL 103 07/24/2019   HDL 40.50 07/24/2019   LDLCALC 54 07/24/2019   LDLDIRECT 91.1 05/27/2014   TRIG 45.0 07/24/2019   CHOLHDL 3 07/24/2019     Lab Results  Component Value Date   ALT 24 10/21/2019    Diabetic foot exam in 4/21, difficult to palpate pedal pulses  No recurrence of kidney stones, he was told it was related to taking Topamax    Examination:   BP (!) 124/64 (BP Location: Left Arm, Patient Position: Sitting, Cuff Size: Large)   Pulse 80   Ht 6\' 1"  (1.854 m)   Wt (!) 321 lb 6.4 oz (145.8 kg)   SpO2 94%   BMI 42.40 kg/m   Body mass index is 42.4 kg/m.      ASSESSMENT/ PLAN:  Diabetes type 2 with morbid obesity  See history  of present illness for detailed discussion of current diabetes management, blood sugar patterns and problems identified   A1c is back at 7.2  As before he is having difficulty with weight gain, portion control and postprandial hyperglycemia based on his dietary compliance He thinks he is also having some periodic nausea and vomiting from Victoza Previously not tolerating Invokana Has limited options for weight loss Discussed that long-term treatment with the higher dose phentermine is not indicated for safe with his risk factors  He will be given a trial of Ozempic instead of Victoza to see if this is more efficacious He can try to titrate this fairly quickly with 0.25 mg weekly for 2 weeks shots and then 0.5 weekly until the end of the device He will then call for prescription for the 1 mg pen If not doing well may also try Trulicity  We can consider bariatric surgery as an option also if  this is not effective Meanwhile change phentermine to before dinnertime  He is monitoring with freestyle libre and still needs to do fingerstick comparison regularly because of occasionally low normal or low readings that are more accurate  Mild hypertension: Well-controlled   There are no Patient Instructions on file for this visit.   Reather LittlerAjay Genasis Zingale 01/28/2020, 11:03 AM

## 2020-02-11 ENCOUNTER — Other Ambulatory Visit: Payer: Self-pay | Admitting: Endocrinology

## 2020-02-12 ENCOUNTER — Other Ambulatory Visit: Payer: Self-pay | Admitting: Endocrinology

## 2020-02-28 ENCOUNTER — Other Ambulatory Visit: Payer: Self-pay | Admitting: Internal Medicine

## 2020-03-09 ENCOUNTER — Other Ambulatory Visit: Payer: Self-pay | Admitting: Endocrinology

## 2020-03-09 ENCOUNTER — Other Ambulatory Visit: Payer: Self-pay | Admitting: Internal Medicine

## 2020-03-09 NOTE — Telephone Encounter (Signed)
Dr Kumar patient

## 2020-04-26 ENCOUNTER — Other Ambulatory Visit: Payer: BC Managed Care – PPO

## 2020-04-29 ENCOUNTER — Other Ambulatory Visit: Payer: BC Managed Care – PPO

## 2020-04-30 ENCOUNTER — Other Ambulatory Visit: Payer: Self-pay | Admitting: Endocrinology

## 2020-05-05 ENCOUNTER — Ambulatory Visit: Payer: BC Managed Care – PPO | Admitting: Endocrinology

## 2020-05-10 ENCOUNTER — Other Ambulatory Visit (INDEPENDENT_AMBULATORY_CARE_PROVIDER_SITE_OTHER): Payer: BC Managed Care – PPO

## 2020-05-10 ENCOUNTER — Other Ambulatory Visit: Payer: Self-pay

## 2020-05-10 DIAGNOSIS — E782 Mixed hyperlipidemia: Secondary | ICD-10-CM | POA: Diagnosis not present

## 2020-05-10 DIAGNOSIS — E1165 Type 2 diabetes mellitus with hyperglycemia: Secondary | ICD-10-CM

## 2020-05-10 LAB — COMPREHENSIVE METABOLIC PANEL
ALT: 24 U/L (ref 0–53)
AST: 28 U/L (ref 0–37)
Albumin: 3.7 g/dL (ref 3.5–5.2)
Alkaline Phosphatase: 65 U/L (ref 39–117)
BUN: 10 mg/dL (ref 6–23)
CO2: 27 mEq/L (ref 19–32)
Calcium: 9 mg/dL (ref 8.4–10.5)
Chloride: 103 mEq/L (ref 96–112)
Creatinine, Ser: 0.84 mg/dL (ref 0.40–1.50)
GFR: 94.61 mL/min (ref >60.00)
Glucose, Bld: 121 mg/dL — ABNORMAL HIGH (ref 70–99)
Potassium: 4.4 mEq/L (ref 3.5–5.1)
Sodium: 136 mEq/L (ref 135–145)
Total Bilirubin: 1.6 mg/dL — ABNORMAL HIGH (ref 0.2–1.2)
Total Protein: 6.6 g/dL (ref 6.0–8.3)

## 2020-05-10 LAB — LIPID PANEL
Cholesterol: 96 mg/dL (ref 0–200)
HDL: 41.3 mg/dL (ref >39.00)
LDL Cholesterol: 45 mg/dL (ref 0–99)
NonHDL: 54.36
Total CHOL/HDL Ratio: 2
Triglycerides: 49 mg/dL (ref 0.0–149.0)
VLDL: 9.8 mg/dL (ref 0.0–40.0)

## 2020-05-10 LAB — HEMOGLOBIN A1C: Hgb A1c MFr Bld: 7 % — ABNORMAL HIGH (ref 4.6–6.5)

## 2020-05-13 ENCOUNTER — Other Ambulatory Visit: Payer: Self-pay

## 2020-05-13 ENCOUNTER — Encounter: Payer: Self-pay | Admitting: Endocrinology

## 2020-05-13 ENCOUNTER — Ambulatory Visit: Payer: BC Managed Care – PPO | Admitting: Endocrinology

## 2020-05-13 VITALS — BP 122/64 | HR 84 | Ht 73.0 in | Wt 309.6 lb

## 2020-05-13 DIAGNOSIS — E1165 Type 2 diabetes mellitus with hyperglycemia: Secondary | ICD-10-CM

## 2020-05-13 DIAGNOSIS — E782 Mixed hyperlipidemia: Secondary | ICD-10-CM

## 2020-05-13 DIAGNOSIS — I10 Essential (primary) hypertension: Secondary | ICD-10-CM

## 2020-05-13 NOTE — Progress Notes (Signed)
Patient ID: Darryl Hunt, male   DOB: 1960-09-30, 59 y.o.   MRN: [id number]    Reason for Appointment: follow-up   History of Present Illness   Diagnosis: Type 2 DIABETES MELITUS, date of diagnosis: 2010        PREVIOUS history: He has been treated previously with regimen of metformin, glipizide and subsequently Victoza Overall has had fairly good control and glipizide was stopped because of tendency to hypoglycemia His A1c in the past has ranged from 6.7-7%, was 7% in 4/14 He was started on Invokana in 8/14; with this he started losing weight and blood sugars were consistently improved Because of recurrent balanitis he stopped this on his visit in 8/16  RECENT history:   He is on a regimen of   Amaryl 2 mg at bedtime. and metformin 2 g q a day. Ozempic 0.25  A1c has come down gradually and now 7%   Current management, blood sugar patterns and problems identified:  He has been taking Ozempic since his last visit in July  Previously in July he was complaining of episodes of nausea and vomiting periodically possibly from Victoza  However when starting the 0.5 mg Ozempic he was having a lot of gas pains in his abdomen and alternating constipation and diarrhea  For the last 4 weeks or so he has gone back to 0.25 mg Ozempic  He thinks his GI symptoms are less and better tolerable now  He has been able to lose weight finally and is now down about 12 pounds compared to his last visit  Also he has been more active outside lawnmowing with a push mower  Although his blood sugars are generally well controlled throughout the day are on an average still about 150 most of the time  Morning sugars are somewhat variable  Has only occasional postprandial spikes after evening meal for midday based on his diet No hypoglycemia  Side effects from medications: balanitis from Invokana.  Vomiting from Victoza 1.8 mg  CGM use % of time  60  2-week average/SD  154, GV 25  Time  in range     78   %  % Time Above 180  22  % Time above 250   % Time Below 70  0     PRE-MEAL Fasting Lunch Dinner Bedtime Overall  Glucose range:  70-173      Averages:  134  150  146     POST-MEAL PC Breakfast PC Lunch PC Dinner  Glucose range:     Averages:  157  167  169   Previous data:    CGM use % of time  50  2-week average/SD  150, GV 32  Time in range     77   %  % Time Above 180  17  % Time above 250   % Time Below 70 2     PRE-MEAL Fasting Lunch Dinner Bedtime Overall  Glucose range:       Averages:  154   133   150   POST-MEAL PC Breakfast PC Lunch PC Dinner  Glucose range:     Averages:  175  149  165     Wt Readings from Last 3 Encounters:  05/13/20 (!) 309 lb 9.6 oz (140.4 kg)  01/28/20 (!) 321 lb 6.4 oz (145.8 kg)  10/27/19 (!) 312 lb 6.4 oz (141.7 kg)   Lab Results  Component Value Date   HGBA1C 7.0 (H) 05/10/2020  HGBA1C 7.2 (H) 01/21/2020   HGBA1C 7.6 (H) 10/21/2019   Lab Results  Component Value Date   MICROALBUR 3.8 (H) 07/24/2019   LDLCALC 45 05/10/2020   CREATININE 0.84 05/10/2020    OTHER active problems: See review of systems   Lab on 05/10/2020  Component Date Value Ref Range Status  . Cholesterol 05/10/2020 96  0 - 200 mg/dL Final   ATP III Classification       Desirable:  < 200 mg/dL               Borderline High:  200 - 239 mg/dL          High:  > = 528240 mg/dL  . Triglycerides 05/10/2020 49.0  0 - 149 mg/dL Final   Normal:  <413<150 mg/dLBorderline High:  150 - 199 mg/dL  . HDL 05/10/2020 41.30  >39.00 mg/dL Final  . VLDL 24/40/102711/08/2019 9.8  0.0 - 25.340.0 mg/dL Final  . LDL Cholesterol 05/10/2020 45  0 - 99 mg/dL Final  . Total CHOL/HDL Ratio 05/10/2020 2   Final                  Men          Women1/2 Average Risk     3.4          3.3Average Risk          5.0          4.42X Average Risk          9.6          7.13X Average Risk          15.0          11.0                      . NonHDL 05/10/2020 54.36   Final   NOTE:  Non-HDL goal  should be 30 mg/dL higher than patient's LDL goal (i.e. LDL goal of < 70 mg/dL, would have non-HDL goal of < 100 mg/dL)  . Sodium 05/10/2020 136  135 - 145 mEq/L Final  . Potassium 05/10/2020 4.4  3.5 - 5.1 mEq/L Final  . Chloride 05/10/2020 103  96 - 112 mEq/L Final  . CO2 05/10/2020 27  19 - 32 mEq/L Final  . Glucose, Bld 05/10/2020 121* 70 - 99 mg/dL Final  . BUN 66/44/034711/08/2019 10  6 - 23 mg/dL Final  . Creatinine, Ser 05/10/2020 0.84  0.40 - 1.50 mg/dL Final  . Total Bilirubin 05/10/2020 1.6* 0.2 - 1.2 mg/dL Final  . Alkaline Phosphatase 05/10/2020 65  39 - 117 U/L Final  . AST 05/10/2020 28  0 - 37 U/L Final  . ALT 05/10/2020 24  0 - 53 U/L Final  . Total Protein 05/10/2020 6.6  6.0 - 8.3 g/dL Final  . Albumin 42/59/563811/08/2019 3.7  3.5 - 5.2 g/dL Final  . GFR 75/64/332911/08/2019 94.61  >60.00 mL/min Final   Calculated using the CKD-EPI Creatinine Equation (2021)  . Calcium 05/10/2020 9.0  8.4 - 10.5 mg/dL Final  . Hgb J1OA1c MFr Bld 05/10/2020 7.0* 4.6 - 6.5 % Final   Glycemic Control Guidelines for People with Diabetes:Non Diabetic:  <6%Goal of Therapy: <7%Additional Action Suggested:  >8%     Allergies as of 05/13/2020      Reactions   Latex Other (See Comments)   Pt states he is not allergic to latex, and does not want it because his wife is allergic  and If he gets it on him she breaks out.      Medication List       Accurate as of May 13, 2020  8:58 AM. If you have any questions, ask your nurse or doctor.        etodolac 300 MG capsule Commonly known as: LODINE Take 300 mg by mouth 2 (two) times daily. Take 1 tablet by mouth twice daily.   Fish Oil Ultra 1400 MG Caps Take 2,800 mg by mouth 2 (two) times daily. Take 2 capsules by mouth twice daily.   FreeStyle Libre 14 Day Sensor Misc APPLY 1 SENSOR TO UPPER ARM - REMOVE AND REPLACE EVERY 14 DAYS   FreeStyle Libre 2 Sensor Misc APPLY 1 SENSOR AS DIRECTED EVERY 14 DAYS   FreeStyle Libre 2 Reader Devi 1 each by Other route every 14  (fourteen) days.   glimepiride 4 MG tablet Commonly known as: AMARYL TAKE 1/2 TABLET BY MOUTH TWICE A DAY   Insulin Pen Needle 32G X 4 MM Misc Use 1 needle daily.   levocetirizine 5 MG tablet Commonly known as: XYZAL Take 5 mg by mouth every evening.   lisinopril 10 MG tablet Commonly known as: ZESTRIL TAKE 1 TABLET BY MOUTH  DAILY   metFORMIN 1000 MG tablet Commonly known as: GLUCOPHAGE TAKE 1 TABLET BY MOUTH  TWICE DAILY   mometasone 50 MCG/ACT nasal spray Commonly known as: NASONEX Place 2 sprays into the nose daily as needed (allergies).   omeprazole 20 MG capsule Commonly known as: PRILOSEC Take 20 mg by mouth daily.   Ozempic (0.25 or 0.5 MG/DOSE) 2 MG/1.5ML Sopn Generic drug: Semaglutide(0.25 or 0.5MG /DOS) INJECT 0.5 MG TOTAL INTO THE SKIN ONCE A WEEK.   phentermine 15 MG capsule TAKE 1 CAPSULE BY MOUTH ONCE DAILY IN THE MORNING   simvastatin 40 MG tablet Commonly known as: ZOCOR TAKE 1 TABLET BY MOUTH  DAILY   tamsulosin 0.4 MG Caps capsule Commonly known as: FLOMAX Take 0.4 mg by mouth daily after supper.   Victoza 18 MG/3ML Sopn Generic drug: liraglutide INJECT SUBCUTANEOUSLY 1.8MG  DAILY AT 12 NOON   Vitamin D 125 MCG (5000 UT) Caps Take 5,000 Units by mouth 3 (three) times daily.       Allergies:  Allergies  Allergen Reactions  . Latex Other (See Comments)    Pt states he is not allergic to latex, and does not want it because his wife is allergic and If he gets it on him she breaks out.    Past Medical History:  Diagnosis Date  . Arthritis    Back  . Diabetes mellitus without complication (HCC)    Type II  . Fatty liver   . GERD (gastroesophageal reflux disease)   . History of kidney stones   . Hyperlipidemia   . Hypertension   . OSA on CPAP   . Pinched nerve in neck   . Seasonal allergies   . Varicose vein of leg    bilateral  . Wears glasses   . Wears hearing aid in both ears     Past Surgical History:  Procedure  Laterality Date  . CHOLECYSTECTOMY    . COLONOSCOPY    . CYSTOSCOPY WITH RETROGRADE PYELOGRAM, URETEROSCOPY AND STENT PLACEMENT Left 03/19/2019   Procedure: CYSTOSCOPY WITH LEFT  RETROGRADE URETEROSCOPY WITH HOLMIUM LASER ANDPOSSIBLE STENT STENT PLACEMENT;  Surgeon: Bjorn Pippin, MD;  Location: [facility];  Service: Urology;  Laterality: Left;  . FRACTURE SURGERY Right  Arm childhood x2  . KNEE ARTHROSCOPY Left   . NASAL SINUS SURGERY    . SHOULDER ARTHROSCOPY Right   . TONSILLECTOMY AND ADENOIDECTOMY     Childhood  . TRIGGER FINGER RELEASE Left     Family History  Problem Relation Age of Onset  . Cancer Mother   . Hypertension Mother   . Cancer Father   . Hypertension Father   . Hypertension Brother   . Diabetes Brother   . Diabetes Maternal Grandmother     Social History:  reports that he has never smoked. He has never used smokeless tobacco. He reports that he does not drink alcohol and does not use drugs.  Review of Systems:   HYPERTENSION:  Has been controlled with lisinopril 10mg   BP Readings from Last 3 Encounters:  05/13/20 122/64  01/28/20 (!) 124/64  10/27/19 126/68    HYPERLIPIDEMIA:   Has good control with simvastatin 40 mg, triglycerides and LDL normal   Lab Results  Component Value Date   CHOL 96 05/10/2020   HDL 41.30 05/10/2020   LDLCALC 45 05/10/2020   LDLDIRECT 91.1 05/27/2014   TRIG 49.0 05/10/2020   CHOLHDL 2 05/10/2020     Lab Results  Component Value Date   ALT 24 05/10/2020    Diabetic foot exam in 4/21, difficult to palpate pedal pulses  No recurrence of kidney stones, he was told it was related to taking Topamax previously  He takes Prilosec for reflux, no recent problems Also as above has some GI side effects from Ozempic    Examination:   BP 122/64   Pulse 84   Ht 6\' 1"  (1.854 m)   Wt (!) 309 lb 9.6 oz (140.4 kg)   SpO2 93%   BMI 40.85 kg/m   Body mass index is 40.85 kg/m.      ASSESSMENT/  PLAN:  Diabetes type 2 with morbid obesity  See history of present illness for detailed discussion of current diabetes management, blood sugar patterns and problems identified   A1c is back at 7.0  He is now on Ozempic, Metformin and glimepiride  Previously has had difficulty with inadequate control, weight gain and some postprandial hyperglycemia  Now he is on Ozempic 0.25 mg weekly recently Although he still having some GI side effects from this he is willing to continue for longer period of time since he is seeing results Also not sure if his weight loss is related to Ozempic or GI side effects keeping his appetite down  His blood sugar control is generally good although still has about 22% of his recent CGM readings above 180 at no consistent time Highest readings are generally late evening after dinner or close to bedtime  He will try to take some symptomatic relief for gaseousness and abdominal discomfort as well as bowel habit changes as needed  If not doing well may also try Trulicity and he will call if needed to change  Encouraged him to start exercising and do this regularly through winter months, currently active with lawnmowing For obesity can still continue phentermine as he is tolerating this well and no change in blood pressure or episodes of increased heart rate  Mild hypertension: Well-controlled and he will continue lisinopril 10 mg  LIPIDS: Well-controlled with simvastatin 80 we will continue   There are no Patient Instructions on file for this visit.   05/13/2020, 8:58 AM

## 2020-06-21 ENCOUNTER — Other Ambulatory Visit: Payer: Self-pay | Admitting: Endocrinology

## 2020-07-19 ENCOUNTER — Other Ambulatory Visit: Payer: Self-pay | Admitting: Endocrinology

## 2020-07-19 NOTE — Telephone Encounter (Signed)
Ok to refill? Last prescribed on 03/09/2020 #30 with 3 refills. Last OV on 05/13/2020.Next appointment on 09/16/2020.

## 2020-08-11 ENCOUNTER — Other Ambulatory Visit: Payer: Self-pay | Admitting: Endocrinology

## 2020-09-13 ENCOUNTER — Other Ambulatory Visit (INDEPENDENT_AMBULATORY_CARE_PROVIDER_SITE_OTHER): Payer: BC Managed Care – PPO

## 2020-09-13 ENCOUNTER — Other Ambulatory Visit: Payer: Self-pay

## 2020-09-13 DIAGNOSIS — E1165 Type 2 diabetes mellitus with hyperglycemia: Secondary | ICD-10-CM | POA: Diagnosis not present

## 2020-09-13 LAB — COMPREHENSIVE METABOLIC PANEL
ALT: 24 U/L (ref 0–53)
AST: 27 U/L (ref 0–37)
Albumin: 3.6 g/dL (ref 3.5–5.2)
Alkaline Phosphatase: 63 U/L (ref 39–117)
BUN: 13 mg/dL (ref 6–23)
CO2: 26 mEq/L (ref 19–32)
Calcium: 9.1 mg/dL (ref 8.4–10.5)
Chloride: 103 mEq/L (ref 96–112)
Creatinine, Ser: 0.78 mg/dL (ref 0.40–1.50)
GFR: 96.51 mL/min (ref >60.00)
Glucose, Bld: 148 mg/dL — ABNORMAL HIGH (ref 70–99)
Potassium: 4.2 mEq/L (ref 3.5–5.1)
Sodium: 137 mEq/L (ref 135–145)
Total Bilirubin: 1.7 mg/dL — ABNORMAL HIGH (ref 0.2–1.2)
Total Protein: 6.6 g/dL (ref 6.0–8.3)

## 2020-09-13 LAB — HEMOGLOBIN A1C: Hgb A1c MFr Bld: 7.2 % — ABNORMAL HIGH (ref 4.6–6.5)

## 2020-09-16 ENCOUNTER — Ambulatory Visit: Payer: BC Managed Care – PPO | Admitting: Endocrinology

## 2020-09-16 ENCOUNTER — Other Ambulatory Visit: Payer: Self-pay

## 2020-09-16 ENCOUNTER — Encounter: Payer: Self-pay | Admitting: Endocrinology

## 2020-09-16 VITALS — BP 124/82 | HR 75 | Ht 73.0 in | Wt 315.0 lb

## 2020-09-16 DIAGNOSIS — E1165 Type 2 diabetes mellitus with hyperglycemia: Secondary | ICD-10-CM | POA: Diagnosis not present

## 2020-09-16 DIAGNOSIS — I10 Essential (primary) hypertension: Secondary | ICD-10-CM | POA: Diagnosis not present

## 2020-09-16 LAB — MICROALBUMIN / CREATININE URINE RATIO
Creatinine,U: 113.6 mg/dL
Microalb Creat Ratio: 0.7 mg/g (ref 0.0–30.0)
Microalb, Ur: 0.8 mg/dL (ref 0.0–1.9)

## 2020-09-16 NOTE — Progress Notes (Signed)
Patient ID: Darryl Hunt, male   DOB: [birth year], 60 y.o.   MRN: [id number]    Reason for Appointment: follow-up   History of Present Illness   Diagnosis: Type 2 DIABETES MELITUS, date of diagnosis: 2010        PREVIOUS history: He has been treated previously with regimen of metformin, glipizide and subsequently Victoza Overall has had fairly good control and glipizide was stopped because of tendency to hypoglycemia His A1c in the past has ranged from 6.7-7%, was 7% in 4/14 He was started on Invokana in 8/14; with this he started losing weight and blood sugars were consistently improved Because of recurrent balanitis he stopped this on his visit in 8/16  RECENT history:   He is on a regimen of   Amaryl 2 mg at bedtime. and metformin 2 g q a day. Ozempic 0.25  A1c  now 7.2%, was 7%   Current management, blood sugar patterns and problems identified:  He has been taking Ozempic since 7/21  He is currently tolerating 0.25 mg weekly, previously having significant GI symptoms like gas and bowel changes  Currently is not keen on switching to another medication such as Bydureon  Although he had previously lost some weight he has gained back about 6 pounds  He has not been active or walking for exercise during winter months and does not have any indoor equipment  His blood sugar download indicates periodic hypoglycemia after meals especially after dinner but occasionally early morning and midday also  FASTING blood sugars are reasonably good but still mildly increased at 148 in the lab No hypoglycemia  Side effects from medications: balanitis from Invokana.  Vomiting from Victoza 1.8 mg  CGM use % of time  58  2-week average/GV  157/22  Time in range    77    %  % Time Above 180  22+1  % Time above 250   % Time Below 70      PRE-MEAL Fasting Lunch Dinner Bedtime Overall  Glucose range:       Averages:  138  140  135   157   POST-MEAL PC Breakfast PC Lunch  PC Dinner  Glucose range:     Averages:  172  154  194   Prior   CGM use % of time  60  2-week average/SD  154, GV 25  Time in range     78   %  % Time Above 180  22  % Time above 250   % Time Below 70  0     PRE-MEAL Fasting Lunch Dinner Bedtime Overall  Glucose range:  70-173      Averages:  134  150  146     POST-MEAL PC Breakfast PC Lunch PC Dinner  Glucose range:     Averages:  157  167  169     Wt Readings from Last 3 Encounters:  09/16/20 (!) 315 lb (142.9 kg)  05/13/20 (!) 309 lb 9.6 oz (140.4 kg)  01/28/20 (!) 321 lb 6.4 oz (145.8 kg)   Lab Results  Component Value Date   HGBA1C 7.2 (H) 09/13/2020   HGBA1C 7.0 (H) 05/10/2020   HGBA1C 7.2 (H) 01/21/2020   Lab Results  Component Value Date   MICROALBUR 3.8 (H) 07/24/2019   LDLCALC 45 05/10/2020   CREATININE 0.78 09/13/2020    OTHER active problems: See review of systems   Lab on 09/13/2020  Component Date Value Ref Range  Status  . Sodium 09/13/2020 137  135 - 145 mEq/L Final  . Potassium 09/13/2020 4.2  3.5 - 5.1 mEq/L Final  . Chloride 09/13/2020 103  96 - 112 mEq/L Final  . CO2 09/13/2020 26  19 - 32 mEq/L Final  . Glucose, Bld 09/13/2020 148* 70 - 99 mg/dL Final  . BUN 99/24/2683 13  6 - 23 mg/dL Final  . Creatinine, Ser 09/13/2020 0.78  0.40 - 1.50 mg/dL Final  . Total Bilirubin 09/13/2020 1.7* 0.2 - 1.2 mg/dL Final  . Alkaline Phosphatase 09/13/2020 63  39 - 117 U/L Final  . AST 09/13/2020 27  0 - 37 U/L Final  . ALT 09/13/2020 24  0 - 53 U/L Final  . Total Protein 09/13/2020 6.6  6.0 - 8.3 g/dL Final  . Albumin 41/96/2229 3.6  3.5 - 5.2 g/dL Final  . GFR 79/89/2119 96.51  >60.00 mL/min Final   Calculated using the CKD-EPI Creatinine Equation (2021)  . Calcium 09/13/2020 9.1  8.4 - 10.5 mg/dL Final  . Hgb E1D MFr Bld 09/13/2020 7.2* 4.6 - 6.5 % Final   Glycemic Control Guidelines for People with Diabetes:Non Diabetic:  <6%Goal of Therapy: <7%Additional Action Suggested:  >8%      Allergies as of 09/16/2020      Reactions   Latex Other (See Comments)   Pt states he is not allergic to latex, and does not want it because his wife is allergic and If he gets it on him she breaks out.      Medication List       Accurate as of September 16, 2020  9:17 AM. If you have any questions, ask your nurse or doctor.        etodolac 300 MG capsule Commonly known as: LODINE Take 300 mg by mouth 2 (two) times daily. Take 1 tablet by mouth twice daily.   Fish Oil Ultra 1400 MG Caps Take 2,800 mg by mouth 2 (two) times daily. Take 2 capsules by mouth twice daily.   FreeStyle Libre 14 Day Sensor Misc APPLY 1 SENSOR TO UPPER ARM - REMOVE AND REPLACE EVERY 14 DAYS   FreeStyle Libre 2 Sensor Misc APPLY 1 SENSOR AS DIRECTED EVERY 14 DAYS   FreeStyle Libre 2 Reader Devi 1 each by Other route every 14 (fourteen) days.   glimepiride 4 MG tablet Commonly known as: AMARYL TAKE 1/2 TABLET BY MOUTH TWICE A DAY   Insulin Pen Needle 32G X 4 MM Misc Use 1 needle daily.   levocetirizine 5 MG tablet Commonly known as: XYZAL Take 5 mg by mouth every evening.   lisinopril 10 MG tablet Commonly known as: ZESTRIL TAKE 1 TABLET BY MOUTH  DAILY   metFORMIN 1000 MG tablet Commonly known as: GLUCOPHAGE TAKE 1 TABLET BY MOUTH  TWICE DAILY   mometasone 50 MCG/ACT nasal spray Commonly known as: NASONEX Place 2 sprays into the nose daily as needed (allergies).   omeprazole 20 MG capsule Commonly known as: PRILOSEC Take 20 mg by mouth daily.   Ozempic (0.25 or 0.5 MG/DOSE) 2 MG/1.5ML Sopn Generic drug: Semaglutide(0.25 or 0.5MG /DOS) INJECT 0.5 MG TOTAL INTO THE SKIN ONCE A WEEK. What changed: See the new instructions.   phentermine 15 MG capsule TAKE 1 CAPSULE BY MOUTH ONCE DAILY IN THE MORNING   simvastatin 40 MG tablet Commonly known as: ZOCOR TAKE 1 TABLET BY MOUTH  DAILY   tamsulosin 0.4 MG Caps capsule Commonly known as: FLOMAX Take 0.4 mg by mouth daily  after  supper.   Vitamin D 125 MCG (5000 UT) Caps Take 5,000 Units by mouth 3 (three) times daily.       Allergies:  Allergies  Allergen Reactions  . Latex Other (See Comments)    Pt states he is not allergic to latex, and does not want it because his wife is allergic and If he gets it on him she breaks out.    Past Medical History:  Diagnosis Date  . Arthritis    Back  . Diabetes mellitus without complication (HCC)    Type II  . Fatty liver   . GERD (gastroesophageal reflux disease)   . History of kidney stones   . Hyperlipidemia   . Hypertension   . OSA on CPAP   . Pinched nerve in neck   . Seasonal allergies   . Varicose vein of leg    bilateral  . Wears glasses   . Wears hearing aid in both ears     Past Surgical History:  Procedure Laterality Date  . CHOLECYSTECTOMY    . COLONOSCOPY    . CYSTOSCOPY WITH RETROGRADE PYELOGRAM, URETEROSCOPY AND STENT PLACEMENT Left 03/19/2019   Procedure: CYSTOSCOPY WITH LEFT  RETROGRADE URETEROSCOPY WITH HOLMIUM LASER ANDPOSSIBLE STENT STENT PLACEMENT;  Surgeon: Bjorn PippinWrenn, John, MD;  [facility];  Service: Urology;  Laterality: Left;  . FRACTURE SURGERY Right    Arm childhood x2  . KNEE ARTHROSCOPY Left   . NASAL SINUS SURGERY    . SHOULDER ARTHROSCOPY Right   . TONSILLECTOMY AND ADENOIDECTOMY     Childhood  . TRIGGER FINGER RELEASE Left     Family History  Problem Relation Age of Onset  . Cancer Mother   . Hypertension Mother   . Cancer Father   . Hypertension Father   . Hypertension Brother   . Diabetes Brother   . Diabetes Maternal Grandmother     Social History:  reports that he has never smoked. He has never used smokeless tobacco. He reports that he does not drink alcohol and does not use drugs.  Review of Systems:   HYPERTENSION:  Has been controlled with lisinopril 10mg   BP Readings from Last 3 Encounters:  09/16/20 124/82  05/13/20 122/64  01/28/20 (!) 124/64    HYPERLIPIDEMIA:    Has good control with simvastatin 40 mg, triglycerides and LDL normal   Lab Results  Component Value Date   CHOL 96 05/10/2020   HDL 41.30 05/10/2020   LDLCALC 45 05/10/2020   LDLDIRECT 91.1 05/27/2014   TRIG 49.0 05/10/2020   CHOLHDL 2 05/10/2020     Lab Results  Component Value Date   ALT 24 09/13/2020    Diabetic foot exam in 4/21, difficult to palpate pedal pulses  No recurrence of kidney stones, he was told it was related to taking Topamax previously  He takes Prilosec for reflux, no recent problems Also as above has some GI side effects from Ozempic    Examination:   BP 124/82   Pulse 75   Ht 6\' 1"  (1.854 m)   Wt (!) 315 lb (142.9 kg)   SpO2 99%   BMI 41.56 kg/m   Body mass index is 41.56 kg/m.      ASSESSMENT/ PLAN:  Diabetes type 2 with morbid obesity  See history of present illness for detailed discussion of current diabetes management, blood sugar patterns and problems identified   A1c is 7.2  He is now on Ozempic 0.25 mg weekly, Metformin and  glimepiride  Again has had difficulty with somewhat inadequate control, weight gain and some postprandial hyperglycemia  He likely can do better with lifestyle changes especially exercise Also may do better with a more therapeutic dose of another GLP-1 such as Bydureon These options were discussed in detail what he wants to wait until next visit  Recently has gained back to weight he had lost it despite continuing low-dose phentermine; as before not taking Topamax because of potential for kidney stones according to nephrologist  He thinks he can start doing significant amount of exercise with brisk walking to help bring A1c back down below 7 Also long-term will benefit from significant weight loss  Mild hypertension: Well-controlled on lisinopril only Microalbumin normal  There are no Patient Instructions on file for this visit.   Reather Littler 09/16/2020, 9:17 AM

## 2020-09-16 NOTE — Patient Instructions (Signed)
Check blood sugars on waking up 3-4 days a week  Also check blood sugars about 2 hours after meals and do this after different meals by rotation  Recommended blood sugar levels on waking up are 90-130 and about 2 hours after meal is 130-160  Please bring your blood sugar monitor to each visit, thank you  Walk 10-12 miles a week at least

## 2020-10-11 ENCOUNTER — Other Ambulatory Visit: Payer: Self-pay | Admitting: Endocrinology

## 2020-10-18 ENCOUNTER — Telehealth: Payer: Self-pay

## 2020-10-18 NOTE — Telephone Encounter (Signed)
JACOB CICERO PA Key: [id number] has been approved for phentermine thru10/12/2020

## 2020-11-07 ENCOUNTER — Other Ambulatory Visit: Payer: Self-pay | Admitting: *Deleted

## 2020-11-28 ENCOUNTER — Other Ambulatory Visit: Payer: Self-pay | Admitting: Endocrinology

## 2020-11-28 NOTE — Telephone Encounter (Signed)
Last prescribed on 07/20/2020

## 2021-01-02 ENCOUNTER — Other Ambulatory Visit: Payer: Self-pay | Admitting: Endocrinology

## 2021-01-17 ENCOUNTER — Other Ambulatory Visit (INDEPENDENT_AMBULATORY_CARE_PROVIDER_SITE_OTHER): Payer: BC Managed Care – PPO

## 2021-01-17 ENCOUNTER — Other Ambulatory Visit: Payer: Self-pay

## 2021-01-17 DIAGNOSIS — E1165 Type 2 diabetes mellitus with hyperglycemia: Secondary | ICD-10-CM | POA: Diagnosis not present

## 2021-01-17 LAB — BASIC METABOLIC PANEL
BUN: 12 mg/dL (ref 6–23)
CO2: 27 mEq/L (ref 19–32)
Calcium: 8.9 mg/dL (ref 8.4–10.5)
Chloride: 104 mEq/L (ref 96–112)
Creatinine, Ser: 0.78 mg/dL (ref 0.40–1.50)
GFR: 96.28 mL/min (ref >60.00)
Glucose, Bld: 162 mg/dL — ABNORMAL HIGH (ref 70–99)
Potassium: 4.2 mEq/L (ref 3.5–5.1)
Sodium: 137 mEq/L (ref 135–145)

## 2021-01-17 LAB — HEMOGLOBIN A1C: Hgb A1c MFr Bld: 7.1 % — ABNORMAL HIGH (ref 4.6–6.5)

## 2021-01-20 ENCOUNTER — Other Ambulatory Visit: Payer: Self-pay | Admitting: Endocrinology

## 2021-01-20 ENCOUNTER — Encounter: Payer: Self-pay | Admitting: Endocrinology

## 2021-01-20 ENCOUNTER — Other Ambulatory Visit: Payer: Self-pay

## 2021-01-20 ENCOUNTER — Ambulatory Visit: Payer: BC Managed Care – PPO | Admitting: Endocrinology

## 2021-01-20 VITALS — BP 128/64 | HR 78 | Ht 73.0 in | Wt 318.0 lb

## 2021-01-20 DIAGNOSIS — I10 Essential (primary) hypertension: Secondary | ICD-10-CM

## 2021-01-20 DIAGNOSIS — E782 Mixed hyperlipidemia: Secondary | ICD-10-CM | POA: Diagnosis not present

## 2021-01-20 DIAGNOSIS — E1165 Type 2 diabetes mellitus with hyperglycemia: Secondary | ICD-10-CM

## 2021-01-20 MED ORDER — METFORMIN HCL ER 500 MG PO TB24: 2000.0000 mg | ORAL_TABLET | Freq: Every day | ORAL | 3 refills | Status: DC

## 2021-01-20 MED ORDER — ROSUVASTATIN CALCIUM 5 MG PO TABS: 5.0000 mg | ORAL_TABLET | Freq: Every day | ORAL | 3 refills | Status: DC

## 2021-01-20 NOTE — Progress Notes (Addendum)
Patient ID: Darryl Hunt, male   DOB: January 23, 1961, 60 y.o.   MRN: [id number]    Reason for Appointment: follow-up   History of Present Illness   Diagnosis: Type 2 DIABETES MELITUS, date of diagnosis: 2010        PREVIOUS history: He has been treated previously with regimen of metformin, glipizide and subsequently Victoza Overall has had fairly good control and glipizide was stopped because of tendency to hypoglycemia His A1c in the past has ranged from 6.7-7%, was 7% in 4/14 He was started on Invokana in 8/14; with this he started losing weight and blood sugars were consistently improved Because of recurrent balanitis he stopped this on his visit in 8/16  RECENT history:   He is on a regimen of   Amaryl 2 mg at bedtime. and metformin 2 g q a day. Ozempic 0.375 MG weekly  A1c is 7.1 and about the same   Current management, blood sugar patterns and problems identified: He has been taking Ozempic since 7/21 He was able to gradually increase his dose and he thinks he is taking the dose halfway between 0.25 and 0.50 Currently tolerating the slightly higher dose. Although he is usually taking 2 g of metformin he may not take his morning dose until late if he forgets  He was told to start exercising and start watching his diet consistently otherwise consider changing Ozempic to another medication However he has not done any exercise lately. Also not likely consistently watching his diet He says he has been involved with various issues and not focusing on himself  Blood sugars from his freestyle libre show intermittent HYPERGLYCEMIA either overnight or after breakfast Usually eating a biscuit at breakfast when he is going to work Has minimal monitoring after dinner  No hypoglycemia  Side effects from medications: balanitis from Invokana.  Vomiting from Victoza 1.8 mg  CGM use % of time 35  2-week average/GV 150  Time in range        81%  % Time Above 180 19  % Time  above 250   % Time Below 70      PRE-MEAL Fasting Lunch Dinner Bedtime Overall  Glucose range:       Averages: 147   151    POST-MEAL PC Breakfast PC Lunch PC Dinner  Glucose range:     Averages: 183     Previously:   CGM use % of time  58  2-week average/GV  157/22  Time in range    77    %  % Time Above 180  22+1  % Time above 250   % Time Below 70      PRE-MEAL Fasting Lunch Dinner Bedtime Overall  Glucose range:       Averages:  138  140  135   157   POST-MEAL PC Breakfast PC Lunch PC Dinner  Glucose range:     Averages:  172  154  194      Wt Readings from Last 3 Encounters:  01/20/21 (!) 318 lb (144.2 kg)  09/16/20 (!) 315 lb (142.9 kg)  05/13/20 (!) 309 lb 9.6 oz (140.4 kg)   Lab Results  Component Value Date   HGBA1C 7.1 (H) 01/17/2021   HGBA1C 7.2 (H) 09/13/2020   HGBA1C 7.0 (H) 05/10/2020   Lab Results  Component Value Date   MICROALBUR 0.8 09/16/2020   LDLCALC 45 05/10/2020   CREATININE 0.78 01/17/2021    OTHER active problems:  See review of systems   Lab on 01/17/2021  Component Date Value Ref Range Status   Sodium 01/17/2021 137  135 - 145 mEq/L Final   Potassium 01/17/2021 4.2  3.5 - 5.1 mEq/L Final   Chloride 01/17/2021 104  96 - 112 mEq/L Final   CO2 01/17/2021 27  19 - 32 mEq/L Final   Glucose, Bld 01/17/2021 162 (A) 70 - 99 mg/dL Final   BUN 22/08/5425 12  6 - 23 mg/dL Final   Creatinine, Ser 01/17/2021 0.78  0.40 - 1.50 mg/dL Final   GFR 12/30/7626 96.28  >60.00 mL/min Final   Calculated using the CKD-EPI Creatinine Equation (2021)   Calcium 01/17/2021 8.9  8.4 - 10.5 mg/dL Final   Hgb B1D MFr Bld 01/17/2021 7.1 (A) 4.6 - 6.5 % Final   Glycemic Control Guidelines for People with Diabetes:Non Diabetic:  <6%Goal of Therapy: <7%Additional Action Suggested:  >8%     Allergies as of 01/20/2021       Reactions   Latex Other (See Comments)   Pt states he is not allergic to latex, and does not want it because his wife is allergic  and If he gets it on him she breaks out.        Medication List        Accurate as of January 20, 2021 11:39 AM. If you have any questions, ask your nurse or doctor.          STOP taking these medications    metFORMIN 1000 MG tablet Commonly known as: GLUCOPHAGE Replaced by: metFORMIN 500 MG 24 hr tablet Stopped by: Reather Littler, MD   simvastatin 40 MG tablet Commonly known as: ZOCOR Stopped by: Reather Littler, MD       TAKE these medications    etodolac 300 MG capsule Commonly known as: LODINE Take 300 mg by mouth 2 (two) times daily. Take 1 tablet by mouth twice daily.   ferrous sulfate 325 (65 FE) MG tablet Take 325 mg by mouth daily.   Fish Oil Ultra 1400 MG Caps Take 2,800 mg by mouth 2 (two) times daily. Take 2 capsules by mouth twice daily.   FreeStyle Libre 14 Day Sensor Misc APPLY 1 SENSOR TO UPPER ARM - REMOVE AND REPLACE EVERY 14 DAYS   FreeStyle Libre 2 Sensor Misc APPLY 1 SENSOR AS DIRECTED EVERY 14 DAYS   FreeStyle Libre 2 Reader Devi 1 each by Other route every 14 (fourteen) days.   glimepiride 4 MG tablet Commonly known as: AMARYL TAKE 1/2 TABLET BY MOUTH TWICE A DAY   Insulin Pen Needle 32G X 4 MM Misc Use 1 needle daily.   levocetirizine 5 MG tablet Commonly known as: XYZAL Take 5 mg by mouth every evening.   lisinopril 10 MG tablet Commonly known as: ZESTRIL TAKE 1 TABLET BY MOUTH  DAILY   metFORMIN 500 MG 24 hr tablet Commonly known as: GLUCOPHAGE-XR Take 4 tablets (2,000 mg total) by mouth daily with supper. Replaces: metFORMIN 1000 MG tablet Started by: Reather Littler, MD   mometasone 50 MCG/ACT nasal spray Commonly known as: NASONEX Place 2 sprays into the nose daily as needed (allergies).   multivitamin tablet Take 1 tablet by mouth daily.   omeprazole 20 MG capsule Commonly known as: PRILOSEC Take 20 mg by mouth daily.   Ozempic (0.25 or 0.5 MG/DOSE) 2 MG/1.5ML Sopn Generic drug: Semaglutide(0.25 or 0.5MG /DOS) INJECT 0.5  MG TOTAL INTO THE SKIN ONCE A WEEK. What changed: See the new instructions.  phentermine 15 MG capsule TAKE 1 CAPSULE BY MOUTH ONCE DAILY IN THE MORNING   rosuvastatin 5 MG tablet Commonly known as: Crestor Take 1 tablet (5 mg total) by mouth daily. Started by: Reather Littler, MD   tamsulosin 0.4 MG Caps capsule Commonly known as: FLOMAX Take 0.4 mg by mouth daily after supper.   Vitamin D 125 MCG (5000 UT) Caps Take 5,000 Units by mouth 3 (three) times daily.        Allergies:  Allergies  Allergen Reactions   Latex Other (See Comments)    Pt states he is not allergic to latex, and does not want it because his wife is allergic and If he gets it on him she breaks out.    Past Medical History:  Diagnosis Date   Arthritis    Back   Diabetes mellitus without complication (HCC)    Type II   Fatty liver    GERD (gastroesophageal reflux disease)    History of kidney stones    Hyperlipidemia    Hypertension    OSA on CPAP    Pinched nerve in neck    Seasonal allergies    Varicose vein of leg    bilateral   Wears glasses    Wears hearing aid in both ears     Past Surgical History:  Procedure Laterality Date   CHOLECYSTECTOMY     COLONOSCOPY     CYSTOSCOPY WITH RETROGRADE PYELOGRAM, URETEROSCOPY AND STENT PLACEMENT Left 03/19/2019   Procedure: CYSTOSCOPY WITH LEFT  RETROGRADE URETEROSCOPY WITH HOLMIUM LASER ANDPOSSIBLE STENT STENT PLACEMENT;  Surgeon: Bjorn Pippin, MD;  Location: [facility];  Service: Urology;  Laterality: Left;   FRACTURE SURGERY Right    Arm childhood x2   KNEE ARTHROSCOPY Left    NASAL SINUS SURGERY     SHOULDER ARTHROSCOPY Right    TONSILLECTOMY AND ADENOIDECTOMY     Childhood   TRIGGER FINGER RELEASE Left     Family History  Problem Relation Age of Onset   Cancer Mother    Hypertension Mother    Cancer Father    Hypertension Father    Hypertension Brother    Diabetes Brother    Diabetes Maternal Grandmother     Social  History:  reports that he has never smoked. He has never used smokeless tobacco. He reports that he does not drink alcohol and does not use drugs.  Review of Systems:   HYPERTENSION:  Has been controlled with lisinopril 10mg   BP Readings from Last 3 Encounters:  01/20/21 128/64  09/16/20 124/82  05/13/20 122/64    HYPERLIPIDEMIA:   Has good control with simvastatin 40 mg, triglycerides and LDL normal   Lab Results  Component Value Date   CHOL 96 05/10/2020   HDL 41.30 05/10/2020   LDLCALC 45 05/10/2020   LDLDIRECT 91.1 05/27/2014   TRIG 49.0 05/10/2020   CHOLHDL 2 05/10/2020     Lab Results  Component Value Date   ALT 24 09/13/2020    Diabetic foot exam in 4/21, difficult to palpate pedal pulses  No recurrence of kidney stones, he was told it was related to taking Topamax previously  He takes Prilosec for reflux    Examination:   BP 128/64   Pulse 78   Ht 6\' 1"  (1.854 m)   Wt (!) 318 lb (144.2 kg)   SpO2 98%   BMI 41.96 kg/m   Body mass index is 41.96 kg/m.      ASSESSMENT/ PLAN:  Diabetes type 2 with morbid obesity  See history of present illness for detailed discussion of current diabetes management, blood sugar patterns and problems identified   A1c is 7.1, previously 7.2  He is now on Ozempic 0.375 mg weekly, Metformin and glimepiride  Although he had wanted to try and improve his lifestyle without changing his medication regimen he still has not made any Currently not exercising, watching his diet consistently or losing weight Blood sugars are under fair control but tend to be high in the mornings and sometimes after breakfast based on his diet He can also do better with cutting back on fast food breakfast meals  Blood sugar monitoring is inadequate although averaging overall about 150 Again discussed that in the long-term will benefit from significant weight loss  Discussed cutting back on high fat foods and especially at breakfast or  eating out Start a walking program and he can do that in the evenings when it is cooler Start checking blood sugars 3-4 times a day especially in the afternoons and evenings to help adjust diet  Continue gradually increasing Ozempic if tolerated Otherwise switch to [facility] Also for better compliance with metformin he will switch to the extended release preparation that he can take all at 1 time  Obesity: He is only on phentermine 15 mg once daily but not clear if this is helping much, currently tolerating well  Mild hypertension: Well-controlled on lisinopril 10 mg and no adverse effects  Microalbumin normal  LIPIDS: We will need follow-up on next visit  Patient Instructions  Avoid biscuits, low fats  Check more at nite  Walk in pms      Reather Littler 01/20/2021, 11:39 AM

## 2021-01-20 NOTE — Patient Instructions (Addendum)
Avoid biscuits, low fats  Check more at nite  Walk in pms

## 2021-02-23 ENCOUNTER — Other Ambulatory Visit: Payer: Self-pay | Admitting: Endocrinology

## 2021-03-27 ENCOUNTER — Other Ambulatory Visit: Payer: Self-pay | Admitting: Endocrinology

## 2021-03-28 NOTE — Telephone Encounter (Signed)
This refill cannot be delegated by CMA

## 2021-03-31 ENCOUNTER — Telehealth: Payer: Self-pay | Admitting: Pharmacy Technician

## 2021-03-31 ENCOUNTER — Other Ambulatory Visit (HOSPITAL_COMMUNITY): Payer: Self-pay

## 2021-03-31 NOTE — Telephone Encounter (Signed)
Patient Advocate Encounter   Received notification from [employer] that prior authorization for PHENTERMINE 15MG  CAPSULES is required.   PA submitted on 03/31/21 Key 04/02/21 Status is pending    [facility] will continue to follow   [id number], CPhT Patient Advocate [facility] Phone: [phone] Fax:  [phone]

## 2021-04-03 NOTE — Telephone Encounter (Signed)
Received a fax regarding Prior Authorization from [employer] for PHENTERMINE 15MG . Authorization has been DENIED because: Per your health plan's criteria, this drug is covered if you meet the following: You have a weight loss of more than or equal to 5% of your baseline body weight.

## 2021-04-12 ENCOUNTER — Other Ambulatory Visit: Payer: Self-pay | Admitting: Ophthalmology

## 2021-04-12 DIAGNOSIS — H4902 Third [oculomotor] nerve palsy, left eye: Secondary | ICD-10-CM

## 2021-04-14 ENCOUNTER — Ambulatory Visit
Admission: RE | Admit: 2021-04-14 | Discharge: 2021-04-14 | Disposition: A | Discharge status: Home / Self Care | Payer: BC Managed Care – PPO | Source: Ambulatory Visit | Attending: Ophthalmology | Admitting: Ophthalmology

## 2021-04-14 ENCOUNTER — Other Ambulatory Visit: Payer: Self-pay

## 2021-04-14 DIAGNOSIS — H4902 Third [oculomotor] nerve palsy, left eye: Secondary | ICD-10-CM

## 2021-04-20 ENCOUNTER — Other Ambulatory Visit: Payer: Self-pay | Admitting: Endocrinology

## 2021-04-20 ENCOUNTER — Other Ambulatory Visit (INDEPENDENT_AMBULATORY_CARE_PROVIDER_SITE_OTHER): Payer: BC Managed Care – PPO

## 2021-04-20 ENCOUNTER — Other Ambulatory Visit: Payer: Self-pay

## 2021-04-20 DIAGNOSIS — E782 Mixed hyperlipidemia: Secondary | ICD-10-CM

## 2021-04-20 DIAGNOSIS — E1165 Type 2 diabetes mellitus with hyperglycemia: Secondary | ICD-10-CM | POA: Diagnosis not present

## 2021-04-20 DIAGNOSIS — R160 Hepatomegaly, not elsewhere classified: Secondary | ICD-10-CM

## 2021-04-20 DIAGNOSIS — R945 Abnormal results of liver function studies: Secondary | ICD-10-CM

## 2021-04-20 LAB — COMPREHENSIVE METABOLIC PANEL
ALT: 28 U/L (ref 0–53)
AST: 23 U/L (ref 0–37)
Albumin: 3.6 g/dL (ref 3.5–5.2)
Alkaline Phosphatase: 62 U/L (ref 39–117)
BUN: 9 mg/dL (ref 6–23)
CO2: 26 mEq/L (ref 19–32)
Calcium: 8.5 mg/dL (ref 8.4–10.5)
Chloride: 104 mEq/L (ref 96–112)
Creatinine, Ser: 0.79 mg/dL (ref 0.40–1.50)
GFR: 95.74 mL/min (ref >60.00)
Glucose, Bld: 275 mg/dL — ABNORMAL HIGH (ref 70–99)
Potassium: 3.9 mEq/L (ref 3.5–5.1)
Sodium: 137 mEq/L (ref 135–145)
Total Bilirubin: 2.1 mg/dL — ABNORMAL HIGH (ref 0.2–1.2)
Total Protein: 6.3 g/dL (ref 6.0–8.3)

## 2021-04-20 LAB — LIPID PANEL
Cholesterol: 113 mg/dL (ref 0–200)
HDL: 48.7 mg/dL (ref >39.00)
LDL Cholesterol: 54 mg/dL (ref 0–99)
NonHDL: 64.63
Total CHOL/HDL Ratio: 2
Triglycerides: 54 mg/dL (ref 0.0–149.0)
VLDL: 10.8 mg/dL (ref 0.0–40.0)

## 2021-04-20 LAB — HEMOGLOBIN A1C: Hgb A1c MFr Bld: 7.2 % — ABNORMAL HIGH (ref 4.6–6.5)

## 2021-04-21 LAB — AFP TUMOR MARKER: AFP, Serum, Tumor Marker: 2.6 ng/mL (ref 0.0–8.4)

## 2021-04-24 NOTE — Progress Notes (Signed)
Patient ID: Darryl Hunt, male   DOB: [birth year], 60 y.o.   MRN: [id number]    Reason for Appointment: follow-up   History of Present Illness   Diagnosis: Type 2 DIABETES MELITUS, date of diagnosis: 2010        PREVIOUS history: He has been treated previously with regimen of metformin, glipizide and subsequently Victoza Overall has had fairly good control and glipizide was stopped because of tendency to hypoglycemia His A1c in the past has ranged from 6.7-7%, was 7% in 4/14 He was started on Invokana in 8/14; with this he started losing weight and blood sugars were consistently improved Because of recurrent balanitis he stopped this on his visit in 8/16  RECENT history:   He is on a regimen of   Amaryl 2 mg at bedtime. and metformin 2 g  a day. 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  Vomiting from Victoza 1.8 mg  Data from freestyle libre on the last 2 weeks:  CGM use % of time 23  2-week average/GV 185  Time in range       52 %  % Time Above 180 40  % Time  above 250 8  % Time Below 70 0    Prior   CGM use % of time 35  2-week average/GV 150  Time in range        81%  % Time Above 180 19  % Time above 250   % Time Below 70      PRE-MEAL Fasting Lunch Dinner Bedtime Overall  Glucose range:       Averages: 147   151    POST-MEAL PC Breakfast PC Lunch PC Dinner  Glucose range:     Averages: 183      Wt Readings from Last 3 Encounters:  04/25/21 (!) 309 lb 12.8 oz (140.5 kg)  01/20/21 (!) 318 lb (144.2 kg)  09/16/20 (!) 315 lb (142.9 kg)   Lab Results  Component Value Date   HGBA1C 7.2 (H) 04/20/2021   HGBA1C 7.1 (H) 01/17/2021   HGBA1C 7.2 (H) 09/13/2020   Lab Results  Component Value Date   MICROALBUR 0.8 09/16/2020   LDLCALC 54 04/20/2021   CREATININE 0.79 04/20/2021    OTHER active problems: See review of systems   Lab on 04/20/2021  Component Date Value Ref Range Status   Cholesterol 04/20/2021 113  0 - 200 mg/dL Final   ATP III Classification       Desirable:  <  200 mg/dL               Borderline High:  200 - 239 mg/dL          High:  > = 130 mg/dL   Triglycerides 86/57/8469 54.0  0.0 - 149.0 mg/dL Final   Normal:  <629 mg/dLBorderline High:  150 - 199 mg/dL   HDL 52/84/1324 40.10  >39.00 mg/dL Final   VLDL 27/25/3664 10.8  0.0 - 40.0 mg/dL Final   LDL Cholesterol 04/20/2021 54  0 - 99 mg/dL Final   Total CHOL/HDL Ratio 04/20/2021 2   Final                  Men          Women1/2 Average Risk     3.4          3.3Average Risk          5.0          4.42X Average Risk          9.6          7.13X Average Risk          15.0          11.0                       NonHDL 04/20/2021 64.63   Final   NOTE:  Non-HDL goal should be 30 mg/dL higher than patient's LDL goal (i.e. LDL goal of < 70 mg/dL, would have non-HDL goal of < 100 mg/dL)   Sodium 40/34/7425 956  135 - 145 mEq/L Final   Potassium 04/20/2021 3.9  3.5 - 5.1 mEq/L Final   Chloride 04/20/2021 104  96 - 112 mEq/L Final   CO2 04/20/2021 26  19 - 32 mEq/L Final    Glucose, Bld 04/20/2021 275 (A) 70 - 99 mg/dL Final   BUN 38/75/6433 9  6 - 23 mg/dL Final   Creatinine, Ser 04/20/2021 0.79  0.40 - 1.50 mg/dL Final   Total Bilirubin 04/20/2021 2.1 (A) 0.2 - 1.2 mg/dL Final   Alkaline Phosphatase 04/20/2021 62  39 - 117 U/L Final   AST 04/20/2021 23  0 - 37 U/L Final   ALT 04/20/2021 28  0 - 53 U/L Final   Total Protein 04/20/2021 6.3  6.0 - 8.3 g/dL Final   Albumin 29/51/8841 3.6  3.5 - 5.2 g/dL Final   GFR 66/12/3014 95.74  >60.00 mL/min Final   Calculated using the CKD-EPI Creatinine Equation (2021)   Calcium 04/20/2021 8.5  8.4 - 10.5 mg/dL Final   Hgb W1U MFr Bld 04/20/2021 7.2 (A) 4.6 - 6.5 % Final   Glycemic Control Guidelines for People with Diabetes:Non Diabetic:  <6%Goal of Therapy: <7%Additional Action Suggested:  >8%    AFP, Serum, Tumor Marker 04/20/2021 2.6  0.0 - 8.4 ng/mL Final   Comment: Roche [employer] Electrochemiluminescence Immunoassay (ECLIA) Values obtained with different assay methods or kits cannot be used interchangeably.  Results cannot be interpreted as absolute evidence of the presence or absence of malignant disease. This test is not interpretable in pregnant females.     Allergies as of 04/25/2021       Reactions   Latex Other (See Comments)   Pt states he is not allergic to latex, and does not want it because his wife is allergic and If he gets it on him she breaks out.        Medication  List        Accurate as of April 25, 2021  9:04 AM. If you have any questions, ask your nurse or doctor.          etodolac 300 MG capsule Commonly known as: LODINE Take 300 mg by mouth 2 (two) times daily. Take 1 tablet by mouth twice daily.   ferrous sulfate 325 (65 FE) MG tablet Take 325 mg by mouth daily.   Fish Oil Ultra 1400 MG Caps Take 2,800 mg by mouth 2 (two) times daily. Take 2 capsules by mouth twice daily.   FreeStyle Libre 14 Day Sensor Misc APPLY 1 SENSOR TO UPPER ARM - REMOVE AND REPLACE  EVERY 14 DAYS   FreeStyle Libre 2 Sensor Misc APPLY 1 SENSOR AS DIRECTED EVERY 14 DAYS   FreeStyle Libre 2 Reader Devi 1 each by Other route every 14 (fourteen) days.   glimepiride 4 MG tablet Commonly known as: AMARYL TAKE 1/2 TABLET BY MOUTH TWICE A DAY   Insulin Pen Needle 32G X 4 MM Misc Use 1 needle daily.   levocetirizine 5 MG tablet Commonly known as: XYZAL Take 5 mg by mouth every evening.   lisinopril 10 MG tablet Commonly known as: ZESTRIL TAKE 1 TABLET BY MOUTH  DAILY   metFORMIN 500 MG 24 hr tablet Commonly known as: GLUCOPHAGE-XR Take 4 tablets (2,000 mg total) by mouth daily with supper.   mometasone 50 MCG/ACT nasal spray Commonly known as: NASONEX Place 2 sprays into the nose daily as needed (allergies).   multivitamin tablet Take 1 tablet by mouth daily.   omeprazole 20 MG capsule Commonly known as: PRILOSEC Take 20 mg by mouth daily.   Ozempic (0.25 or 0.5 MG/DOSE) 2 MG/1.5ML Sopn Generic drug: Semaglutide(0.25 or 0.5MG /DOS) INJECT 0.5 MG TOTAL INTO THE SKIN ONCE A WEEK.   pantoprazole 40 MG tablet Commonly known as: PROTONIX Take 40 mg by mouth daily.   phentermine 15 MG capsule TAKE 1 CAPSULE BY MOUTH ONCE DAILY IN THE MORNING   rosuvastatin 5 MG tablet Commonly known as: Crestor Take 1 tablet (5 mg total) by mouth daily.   tamsulosin 0.4 MG Caps capsule Commonly known as: FLOMAX Take 0.4 mg by mouth daily after supper.   Vitamin D 125 MCG (5000 UT) Caps Take 5,000 Units by mouth 3 (three) times daily.        Allergies:  Allergies  Allergen Reactions   Latex Other (See Comments)    Pt states he is not allergic to latex, and does not want it because his wife is allergic and If he gets it on him she breaks out.    Past Medical History:  Diagnosis Date   Arthritis    Back   Diabetes mellitus without complication (HCC)    Type II   Fatty liver    GERD (gastroesophageal reflux disease)    History of kidney stones     Hyperlipidemia    Hypertension    OSA on CPAP    Pinched nerve in neck    Seasonal allergies    Varicose vein of leg    bilateral   Wears glasses    Wears hearing aid in both ears     Past Surgical History:  Procedure Laterality Date   CHOLECYSTECTOMY     COLONOSCOPY     CYSTOSCOPY WITH RETROGRADE PYELOGRAM, URETEROSCOPY AND STENT PLACEMENT Left 03/19/2019   Procedure: CYSTOSCOPY WITH LEFT  RETROGRADE URETEROSCOPY WITH HOLMIUM LASER ANDPOSSIBLE STENT STENT PLACEMENT;  Surgeon: Annabell Howells,  Jonny Ruiz, MD;  Location: [facility];  Service: Urology;  Laterality: Left;   FRACTURE SURGERY Right    Arm childhood x2   KNEE ARTHROSCOPY Left    NASAL SINUS SURGERY     SHOULDER ARTHROSCOPY Right    TONSILLECTOMY AND ADENOIDECTOMY     Childhood   TRIGGER FINGER RELEASE Left     Family History  Problem Relation Age of Onset   Cancer Mother    Hypertension Mother    Cancer Father    Hypertension Father    Hypertension Brother    Diabetes Brother    Diabetes Maternal Grandmother     Social History:  reports that he has never smoked. He has never used smokeless tobacco. He reports that he does not drink alcohol and does not use drugs.  Review of Systems:   HYPERTENSION: Blood pressure has been well controlled with lisinopril 10mg   BP Readings from Last 3 Encounters:  04/25/21 110/72  01/20/21 128/64  09/16/20 124/82    HYPERLIPIDEMIA:   Has consistent control with simvastatin 40 mg as follows   Lab Results  Component Value Date   CHOL 113 04/20/2021   HDL 48.70 04/20/2021   LDLCALC 54 04/20/2021   LDLDIRECT 91.1 05/27/2014   TRIG 54.0 04/20/2021   CHOLHDL 2 04/20/2021     He has cirrhosis of the liver with varices, followed by gastroenterologist now  Lab Results  Component Value Date   ALT 28 04/20/2021    Diabetic foot exam in 10/22   No recurrence of kidney stones, he was told it was related to taking Topamax previously  He takes Prilosec for  reflux  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     Diabetic Foot Exam - Simple   Simple Foot Form Diabetic Foot exam was performed with the following findings: Yes   Visual Inspection No deformities, no ulcerations, no other skin breakdown bilaterally: Yes Sensation Testing Intact to touch and monofilament testing bilaterally: Yes Pulse Check Posterior Tibialis and Dorsalis pulse intact bilaterally: Yes See comments: Yes Comments Pedal pulses difficult to palpate on the right and left dorsalis pedis      ASSESSMENT/ PLAN:  Diabetes type 2 with morbid obesity  See history of present illness for detailed discussion of current diabetes management, blood sugar patterns and problems identified   A1c is about the same and 7.2  He is now on Ozempic 0.5 mg weekly, Metformin and glimepiride  Tolerating 0.5 mg Ozempic with gradual titration Although he has lost weight his blood sugars are recently higher especially with prednisone Blood sugar monitoring is very inadequate  Discussed cutting back on high fat foods and especially at breakfast and avoid foods like donuts Start a walking routine for exercise Start checking blood sugars consistently every day, needs to check 3-4 times a day especially after meals  Continue gradually increasing Ozempic if tolerated, he will take 0.75 mg for the next 2 doses and then 1 mg weekly if tolerated We can try switching him to the 1 mg pen from the next prescription Otherwise switch to Mounjaro   Mild hypertension: Well-controlled on lisinopril 10 mg and no adverse effects  Cirrhosis liver: He is  being evaluated at [facility] now, not clear if he will benefit from pioglitazone at this stage   LIPIDS: Well-controlled  Influenza vaccine  given  Patient Instructions  Restart low Carb and low fat diet  Ozempic 0.5 + 0.25 dose for 2-3 shots then 1mg  weekly  Walk daily  Check blood sugars on waking up 3 days a week  Also check blood sugars about 2 hours after meals and do this after different meals by rotation  Recommended blood sugar levels on waking up are 90-130 and about 2 hours after meal is 130-160  Please bring your blood sugar monitor to each visit, thank you   04/25/2021, 9:04 AM   Note: This office note was prepared with Dragon voice recognition system technology. Any transcriptional errors that result from this process are unintentional.

## 2021-04-25 ENCOUNTER — Ambulatory Visit: Payer: BC Managed Care – PPO | Admitting: Endocrinology

## 2021-04-25 ENCOUNTER — Other Ambulatory Visit: Payer: Self-pay

## 2021-04-25 ENCOUNTER — Encounter: Payer: Self-pay | Admitting: Endocrinology

## 2021-04-25 VITALS — BP 110/72 | HR 70 | Ht 73.0 in | Wt 309.8 lb

## 2021-04-25 DIAGNOSIS — E1165 Type 2 diabetes mellitus with hyperglycemia: Secondary | ICD-10-CM

## 2021-04-25 DIAGNOSIS — Z23 Encounter for immunization: Secondary | ICD-10-CM

## 2021-04-25 DIAGNOSIS — I10 Essential (primary) hypertension: Secondary | ICD-10-CM

## 2021-04-25 DIAGNOSIS — E782 Mixed hyperlipidemia: Secondary | ICD-10-CM | POA: Diagnosis not present

## 2021-04-25 NOTE — Patient Instructions (Signed)
Restart low Carb and low fat diet  Ozempic 0.5 + 0.25 dose for 2-3 shots then 1mg  weekly  Walk daily  Check blood sugars on waking up 3 days a week  Also check blood sugars about 2 hours after meals and do this after different meals by rotation  Recommended blood sugar levels on waking up are 90-130 and about 2 hours after meal is 130-160  Please bring your blood sugar monitor to each visit, thank you

## 2021-05-01 ENCOUNTER — Other Ambulatory Visit: Payer: Self-pay | Admitting: Endocrinology

## 2021-05-02 ENCOUNTER — Other Ambulatory Visit: Payer: Self-pay | Admitting: Endocrinology

## 2021-05-02 NOTE — Telephone Encounter (Signed)
Pt calling in to request refill of Ozempic 2mg    [id number] [place], [place] - [address]  [address] [place], [place]   Pt contact [phone]

## 2021-05-09 ENCOUNTER — Telehealth: Payer: Self-pay | Admitting: Pharmacy Technician

## 2021-05-09 ENCOUNTER — Other Ambulatory Visit: Payer: Self-pay | Admitting: Endocrinology

## 2021-05-09 NOTE — Telephone Encounter (Signed)
Patient Advocate Encounter  Received notification from [employer] that prior authorization for PHENTERMINE 15MG  is required.   PA submitted on 11.1.22 Key Status is pending   [facility] will continue to follow  [id number], CPhT Patient Advocate [facility] Phone: [phone] Fax:  [phone]

## 2021-05-10 NOTE — Telephone Encounter (Signed)
Received notification from [employer] regarding a prior authorization for PHENTERMINE 15MG . Authorization has been APPROVED from 11.1.22 to 5.1.23.    Authorization #  [id number]

## 2021-07-13 ENCOUNTER — Other Ambulatory Visit: Payer: Self-pay

## 2021-07-13 ENCOUNTER — Other Ambulatory Visit (INDEPENDENT_AMBULATORY_CARE_PROVIDER_SITE_OTHER): Payer: BC Managed Care – PPO

## 2021-07-13 DIAGNOSIS — E1165 Type 2 diabetes mellitus with hyperglycemia: Secondary | ICD-10-CM | POA: Diagnosis not present

## 2021-07-13 LAB — GLUCOSE, RANDOM: Glucose, Bld: 198 mg/dL — ABNORMAL HIGH (ref 70–99)

## 2021-07-13 LAB — HEMOGLOBIN A1C: Hgb A1c MFr Bld: 8 % — ABNORMAL HIGH (ref 4.6–6.5)

## 2021-07-17 NOTE — Progress Notes (Signed)
Patient ID: Darryl Hunt, male   DOB: [birth year], 61 y.o.   MRN: [id number]    Reason for Appointment: follow-up   History of Present Illness   Diagnosis: Type 2 DIABETES MELITUS, date of diagnosis: 2010        PREVIOUS history: He has been treated previously with regimen of metformin, glipizide and subsequently Victoza Overall has had fairly good control and glipizide was stopped because of tendency to hypoglycemia His A1c in the past has ranged from 6.7-7%, was 7% in 4/14 He was started on Invokana in 8/14; with this he started losing weight and blood sugars were consistently improved Because of recurrent balanitis he stopped this on his visit in 8/16  RECENT history:   He is on a regimen of   Amaryl 2 mg at bedtime. and metformin 2 g  a day. Ozempic 0.5 mg weekly  A1c is higher at 8%   Current management, blood sugar patterns and problems identified: He has been taking mostly 0.5 mg Ozempic and only a couple of times and was able to tolerate 0.75 dose  Because of apparently getting consistent supply at his pharmacy he says he has not taken his injections every week  Was not able to move up to 1 mg as recommended  Because of stress and taking care of family member he has been eating more sweets He has gained weight  Not exercising  Checking blood sugars somewhat erratically not every day His blood sugar patterns from the CGM show periodically high readings, mostly in the evenings after dinnertime and occasionally morning also; occasionally fasting readings may be close to target Highest average blood sugar 187 after 10 p.m.    Side effects from medications: balanitis from Invokana.  Vomiting from Victoza 1.8 mg  Data from freestyle libre on the last 2 weeks:  CGM use % of time 34  2-week average/GV 158  Time in range   74     %  % Time Above 180 26  % Time above 250   % Time Below 70 0    Prior   CGM use % of time 23  2-week average/GV 185  Time in  range       52 %  % Time Above 180 40  % Time above 250 8  % Time Below 70 0     Wt Readings from Last 3 Encounters:  07/18/21 (!) 320 lb (145.2 kg)  04/25/21 (!) 309 lb 12.8 oz (140.5 kg)  01/20/21 (!) 318 lb (144.2 kg)   Lab Results  Component Value Date   HGBA1C 8.0 (H) 07/13/2021   HGBA1C 7.2 (H) 04/20/2021   HGBA1C 7.1 (H) 01/17/2021   Lab Results  Component Value Date   MICROALBUR 0.8 09/16/2020   LDLCALC 54 04/20/2021   CREATININE 0.79 04/20/2021    OTHER active problems: See review of systems   Lab on 07/13/2021  Component Date Value Ref Range Status   Glucose, Bld 07/13/2021 198 (H)  70 - 99 mg/dL Final   Hgb [id number] MFr Bld 07/13/2021 8.0 (H)  4.6 - 6.5 % Final   Glycemic Control Guidelines for People with Diabetes:Non Diabetic:  <6%Goal of Therapy: <7%Additional Action Suggested:  >8%     Allergies as of 07/18/2021       Reactions   Latex Other (See Comments)   Pt states he is not allergic to latex, and does not want it because his wife is allergic and If he  gets it on him she breaks out.        Medication List        Accurate as of July 18, 2021  8:28 AM. If you have any questions, ask your nurse or doctor.          Fish Oil Ultra 1400 MG Caps Take 2,800 mg by mouth 2 (two) times daily. Take 2 capsules by mouth twice daily.   FreeStyle Libre 14 Day Sensor Misc APPLY 1 SENSOR TO UPPER ARM - REMOVE AND REPLACE EVERY 14 DAYS   FreeStyle Libre 2 Sensor Misc APPLY 1 SENSOR AS DIRECTED EVERY 14 DAYS   FreeStyle Libre 2 Reader Devi 1 each by Other route every 14 (fourteen) days.   glimepiride 4 MG tablet Commonly known as: AMARYL TAKE 1/2 TABLET BY MOUTH TWICE A DAY   Insulin Pen Needle 32G X 4 MM Misc Use 1 needle daily.   levocetirizine 5 MG tablet Commonly known as: XYZAL Take 5 mg by mouth every evening.   lisinopril 10 MG tablet Commonly known as: ZESTRIL TAKE 1 TABLET BY MOUTH  DAILY   metFORMIN 500 MG 24 hr tablet Commonly  known as: GLUCOPHAGE-XR Take 4 tablets (2,000 mg total) by mouth daily with supper.   mometasone 50 MCG/ACT nasal spray Commonly known as: NASONEX Place 2 sprays into the nose daily as needed (allergies).   multivitamin tablet Take 1 tablet by mouth daily.   omeprazole 20 MG capsule Commonly known as: PRILOSEC Take 20 mg by mouth daily.   ondansetron 4 MG disintegrating tablet Commonly known as: ZOFRAN-ODT 1 tablet once daily   Ozempic (0.25 or 0.5 MG/DOSE) 2 MG/1.5ML Sopn Generic drug: Semaglutide(0.25 or 0.5MG /DOS) INJECT 0.5 MG TOTAL INTO THE SKIN ONCE A WEEK.   pantoprazole 40 MG tablet Commonly known as: PROTONIX Take 40 mg by mouth daily.   phentermine 15 MG capsule TAKE 1 CAPSULE BY MOUTH ONCE DAILY IN THE MORNING   rosuvastatin 5 MG tablet Commonly known as: Crestor Take 1 tablet (5 mg total) by mouth daily.   tamsulosin 0.4 MG Caps capsule Commonly known as: FLOMAX Take 0.4 mg by mouth daily after supper.   Vitamin D 125 MCG (5000 UT) Caps Take 5,000 Units by mouth 3 (three) times daily.        Allergies:  Allergies  Allergen Reactions   Latex Other (See Comments)    Pt states he is not allergic to latex, and does not want it because his wife is allergic and If he gets it on him she breaks out.    Past Medical History:  Diagnosis Date   Arthritis    Back   Diabetes mellitus without complication (HCC)    Type II   Fatty liver    GERD (gastroesophageal reflux disease)    History of kidney stones    Hyperlipidemia    Hypertension    OSA on CPAP    Pinched nerve in neck    Seasonal allergies    Varicose vein of leg    bilateral   Wears glasses    Wears hearing aid in both ears     Past Surgical History:  Procedure Laterality Date   CHOLECYSTECTOMY     COLONOSCOPY     CYSTOSCOPY WITH RETROGRADE PYELOGRAM, URETEROSCOPY AND STENT PLACEMENT Left 03/19/2019   Procedure: CYSTOSCOPY WITH LEFT  RETROGRADE URETEROSCOPY WITH HOLMIUM LASER  ANDPOSSIBLE STENT STENT PLACEMENT;  Surgeon: Bjorn Pippin, MD;  Location: [facility];  Service: Urology;  Laterality: Left;   FRACTURE SURGERY Right    Arm childhood x2   KNEE ARTHROSCOPY Left    NASAL SINUS SURGERY     SHOULDER ARTHROSCOPY Right    TONSILLECTOMY AND ADENOIDECTOMY     Childhood   TRIGGER FINGER RELEASE Left     Family History  Problem Relation Age of Onset   Cancer Mother    Hypertension Mother    Cancer Father    Hypertension Father    Hypertension Brother    Diabetes Brother    Diabetes Maternal Grandmother     Social History:  reports that he has never smoked. He has never used smokeless tobacco. He reports that he does not drink alcohol and does not use drugs.  Review of Systems:   HYPERTENSION: Blood pressure has been well controlled with lisinopril 10mg   BP Readings from Last 3 Encounters:  07/18/21 118/72  04/25/21 110/72  01/20/21 128/64    HYPERLIPIDEMIA:   Has consistent control with simvastatin 40 mg as follows   Lab Results  Component Value Date   CHOL 113 04/20/2021   HDL 48.70 04/20/2021   LDLCALC 54 04/20/2021   LDLDIRECT 91.1 05/27/2014   TRIG 54.0 04/20/2021   CHOLHDL 2 04/20/2021     He has cirrhosis of the liver with varices, followed by gastroenterologist and is being evaluated for possible transplant  Lab Results  Component Value Date   ALT 28 04/20/2021    Diabetic foot exam in 10/22    He takes Prilosec for reflux  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      ASSESSMENT/ PLAN:  Diabetes type 2 with morbid obesity  See history of present illness for detailed discussion of current diabetes management, blood sugar patterns and problems identified   A1c is higher at 8% compared to 7.2  He is on  Ozempic 0.5 mg weekly, Metformin and glimepiride  Tolerating 0.5 mg Ozempic and also had tried 0.75 mg dose without nausea Blood sugar monitoring is again inadequate even with the 11/22 he is checking it sporadically and not every day  He says that he has not been able to go up to 1 mg because of lack of supplies at the pharmacy Also may not be taking the Ozempic regularly Diet has been poorly controlled No regular exercise and has gained weight  We will now try the 1 mg weekly of the Ozempic and he can switch to the 1 mg pen from the next prescription If not tolerated or blood sugars are not improving switch to [facility]  He will also try the libre 3 sensor to provide continuous monitoring Given co-pay card for assistance  Mild hypertension: Well-controlled on lisinopril 10 mg    There are no Patient Instructions on file for this visit.   [place] 07/18/2021, 8:28 AM   Note: This office note was prepared with Dragon voice recognition system technology. Any transcriptional errors that result from this process are unintentional.

## 2021-07-18 ENCOUNTER — Ambulatory Visit: Payer: BC Managed Care – PPO | Admitting: Endocrinology

## 2021-07-18 ENCOUNTER — Other Ambulatory Visit: Payer: Self-pay

## 2021-07-18 ENCOUNTER — Encounter: Payer: Self-pay | Admitting: Endocrinology

## 2021-07-18 VITALS — BP 118/72 | HR 73 | Ht 73.0 in | Wt 320.0 lb

## 2021-07-18 DIAGNOSIS — E1165 Type 2 diabetes mellitus with hyperglycemia: Secondary | ICD-10-CM

## 2021-07-18 MED ORDER — OZEMPIC (1 MG/DOSE) 2 MG/1.5ML ~~LOC~~ SOPN: 1.0000 mg | PEN_INJECTOR | SUBCUTANEOUS | 2 refills | Status: DC

## 2021-07-18 MED ORDER — FREESTYLE LIBRE 3 SENSOR MISC: 1.0000 | 2 refills | Status: DC

## 2021-07-18 NOTE — Patient Instructions (Addendum)
1mg  Ozempic weekly  Walk daily  Check blood sugars on waking up days a week  Also check blood sugars about 2 hours after meals and do this after different meals by rotation  Recommended blood sugar levels on waking up are 90-130 and about 2 hours after meal is 130-160  Please bring your blood sugar monitor to each visit, thank you

## 2021-07-19 ENCOUNTER — Other Ambulatory Visit: Payer: Self-pay | Admitting: Endocrinology

## 2021-09-15 ENCOUNTER — Other Ambulatory Visit: Payer: Self-pay

## 2021-09-15 ENCOUNTER — Other Ambulatory Visit (INDEPENDENT_AMBULATORY_CARE_PROVIDER_SITE_OTHER): Payer: BC Managed Care – PPO

## 2021-09-15 DIAGNOSIS — E1165 Type 2 diabetes mellitus with hyperglycemia: Secondary | ICD-10-CM | POA: Diagnosis not present

## 2021-09-15 LAB — MICROALBUMIN / CREATININE URINE RATIO
Creatinine,U: 241.2 mg/dL
Microalb Creat Ratio: 2.3 mg/g (ref 0.0–30.0)
Microalb, Ur: 5.7 mg/dL — ABNORMAL HIGH (ref 0.0–1.9)

## 2021-09-15 LAB — GLUCOSE, RANDOM: Glucose, Bld: 117 mg/dL — ABNORMAL HIGH (ref 70–99)

## 2021-09-16 LAB — FRUCTOSAMINE: Fructosamine: 307 umol/L — ABNORMAL HIGH (ref 0–285)

## 2021-09-20 ENCOUNTER — Ambulatory Visit: Payer: BC Managed Care – PPO | Admitting: Endocrinology

## 2021-10-05 ENCOUNTER — Ambulatory Visit: Payer: BC Managed Care – PPO | Admitting: Endocrinology

## 2021-10-05 VITALS — BP 118/78 | Temp 77.0°F | Ht 73.0 in | Wt 317.4 lb

## 2021-10-05 DIAGNOSIS — E782 Mixed hyperlipidemia: Secondary | ICD-10-CM

## 2021-10-05 DIAGNOSIS — E1165 Type 2 diabetes mellitus with hyperglycemia: Secondary | ICD-10-CM | POA: Diagnosis not present

## 2021-10-05 LAB — POCT GLUCOSE (DEVICE FOR HOME USE): POC Glucose: 146 mg/dl — AB (ref 70–99)

## 2021-10-05 NOTE — Progress Notes (Signed)
? ? ? Patient ID: Darryl Hunt, male   DOB: 12-05-1960, 61 y.o.   MRN: [id number] ? ? ? ?Reason for Appointment: follow-up  ? ?History of Present Illness  ? ?Diagnosis: Type 2 DIABETES MELITUS, date of diagnosis: 2010    ?    ?PREVIOUS history: He has been treated previously with regimen of metformin, glipizide and subsequently Victoza ?Overall has had fairly good control and glipizide was stopped because of tendency to hypoglycemia ?His A1c in the past has ranged from 6.7-7%, was 7% in 4/14 ?He was started on Invokana in 8/14; with this he started losing weight and blood sugars were consistently improved ?Because of recurrent balanitis he stopped this on his visit in 8/16 ? ?RECENT history:  ? ?He is on a regimen of   Amaryl 2 mg at bedtime. and metformin 2 g  a day. Ozempic 1.0 mg weekly ? ?A1c is as of 12/23 at 8% ?Fructosamine is 307 ? ?Current management, blood sugar patterns and problems identified: ?He has been taking the 1 mg Ozempic since January when this was increased ?He has no side effects like nausea now  ?Also previously because of stress and other reasons he had not watched his diet and gained weight and his blood sugars are generally higher  ?Although his blood sugars appear to be significantly better he is only able to lose 3 pounds even with the higher dose of Ozempic  ?Currently using libre 3 which however appears to be reading about 30-35 mg lower than the fingerstick in the office  ?Blood sugars not appear to be fairly good overnight but variably higher after meals especially after breakfast and lunch ?Not exercising as yet but he thinks he can start doing so now ? ?Checking blood sugars somewhat erratically not every day ?His blood sugar patterns from the CGM show periodically high readings, mostly in the evenings after dinnertime and occasionally morning also; occasionally fasting readings may be close to target ?Highest average blood sugar 187 after 10 p.m.  ? ?Side effects from  medications: balanitis from Invokana.  Vomiting from Victoza 1.8 mg ? ?Data from freestyle libre 3 on the last 2 weeks: ? ?CGM use % of time 76  ?2-week average/GV 138/23  ?Time in range      89  %  ?% Time Above 180 11  ?% Time above 250   ?% Time Below 70   ? ?  ?PRE-MEAL Fasting Lunch Dinner Bedtime Overall  ?Glucose range:       ?Averages: 120      ? ?POST-MEAL PC Breakfast PC Lunch PC Dinner  ?Glucose range:     ?Averages: 144 157 140  ? ?Previously: ? ? ?CGM use % of time 34  ?2-week average/GV 158  ?Time in range   74     %  ?% Time Above 180 26  ?% Time above 250   ?% Time Below 70 0  ? ?   ? ? ?Wt Readings from Last 3 Encounters:  ?10/05/21 (!) 317 lb 6.4 oz (144 kg)  ?07/18/21 (!) 320 lb (145.2 kg)  ?04/25/21 (!) 309 lb 12.8 oz (140.5 kg)  ? ?Lab Results  ?Component Value Date  ? HGBA1C 8.0 (H) 07/13/2021  ? HGBA1C 7.2 (H) 04/20/2021  ? HGBA1C 7.1 (H) 01/17/2021  ? ?Lab Results  ?Component Value Date  ? MICROALBUR 5.7 (H) 09/15/2021  ? LDLCALC 54 04/20/2021  ? CREATININE 0.79 04/20/2021  ? ? ?OTHER active problems: See review of  systems ? ? ?Office Visit on 10/05/2021  ?Component Date Value Ref Range Status  ? POC Glucose 10/05/2021 146 (A)  70 - 99 mg/dl Final  ? ? ?Allergies as of 10/05/2021   ? ?   Reactions  ? Latex Other (See Comments)  ? Pt states he is not allergic to latex, and does not want it because his wife is allergic and If he gets it on him she breaks out.  ? ?  ? ?  ?Medication List  ?  ? ?  ? Accurate as of October 05, 2021 11:59 PM. If you have any questions, ask your nurse or doctor.  ?  ?  ? ?  ? ?STOP taking these medications   ? ?multivitamin tablet ?  ?omeprazole 20 MG capsule ?Commonly known as: PRILOSEC ?  ?phentermine 15 MG capsule ?  ? ?  ? ?TAKE these medications   ? ?cyclobenzaprine 10 MG tablet ?Commonly known as: FLEXERIL ?Take 10 mg by mouth 3 (three) times daily. ?  ?Fish Oil Ultra 1400 MG Caps ?Take 2,800 mg by mouth 2 (two) times daily. Take 2 capsules by mouth twice  daily. ?  ?FreeStyle Libre 2 Reader Hardie Pulleyevi ?1 each by Other route every 14 (fourteen) days. ?  ?FreeStyle Libre 3 Sensor Misc ?1 Device by Does not apply route every 14 (fourteen) days. Apply 1 sensor on upper arm every 14 days for continuous glucose monitoring ?What changed: Another medication with the same name was removed. Continue taking this medication, and follow the directions you see here. ?  ?glimepiride 4 MG tablet ?Commonly known as: AMARYL ?TAKE 1/2 TABLET BY MOUTH TWICE A DAY ?  ?Insulin Pen Needle 32G X 4 MM Misc ?Use 1 needle daily. ?  ?levocetirizine 5 MG tablet ?Commonly known as: XYZAL ?Take 5 mg by mouth every evening. ?  ?lisinopril 10 MG tablet ?Commonly known as: ZESTRIL ?TAKE 1 TABLET BY MOUTH  DAILY ?  ?metFORMIN 500 MG 24 hr tablet ?Commonly known as: GLUCOPHAGE-XR ?Take 4 tablets (2,000 mg total) by mouth daily with supper. ?  ?mometasone 50 MCG/ACT nasal spray ?Commonly known as: NASONEX ?Place 2 sprays into the nose daily as needed (allergies). ?  ?ondansetron 4 MG disintegrating tablet ?Commonly known as: ZOFRAN-ODT ?1 tablet once daily ?  ?Ozempic (1 MG/DOSE) 2 MG/1.5ML Sopn ?Generic drug: Semaglutide (1 MG/DOSE) ?Inject 1 mg into the skin once a week. ?  ?pantoprazole 40 MG tablet ?Commonly known as: PROTONIX ?Take 40 mg by mouth daily. ?  ?rosuvastatin 5 MG tablet ?Commonly known as: Crestor ?Take 1 tablet (5 mg total) by mouth daily. ?  ?tamsulosin 0.4 MG Caps capsule ?Commonly known as: FLOMAX ?Take 0.4 mg by mouth daily after supper. ?  ?Vitamin D 125 MCG (5000 UT) Caps ?Take 5,000 Units by mouth 3 (three) times daily. ?  ? ?  ? ? ?Allergies:  ?Allergies  ?Allergen Reactions  ? Latex Other (See Comments)  ?  Pt states he is not allergic to latex, and does not want it because his wife is allergic and If he gets it on him she breaks out.  ? ? ?Past Medical History:  ?Diagnosis Date  ? Arthritis   ? Back  ? Diabetes mellitus without complication (HCC)   ? Type II  ? Fatty liver   ? GERD  (gastroesophageal reflux disease)   ? History of kidney stones   ? Hyperlipidemia   ? Hypertension   ? OSA on CPAP   ?  Pinched nerve in neck   ? Seasonal allergies   ? 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 Wears glasses   ? Wears hearing aid in both ears   ? ? ?Past Surgical History:  ?Procedure Laterality Date  ? CHOLECYSTECTOMY    ? COLONOSCOPY    ? CYSTOSCOPY WITH RETROGRADE PYELOGRAM, URETEROSCOPY AND STENT PLACEMENT Left 03/19/2019  ? Procedure: CYSTOSCOPY WITH LEFT  RETROGRADE URETEROSCOPY WITH HOLMIUM LASER ANDPOSSIBLE STENT STENT PLACEMENT;  Surgeon: Bjorn Pippin, MD;  Location: [facility];  Service: Urology;  Laterality: Left;  ? FRACTURE SURGERY Right   ? Arm childhood x2  ? KNEE ARTHROSCOPY Left   ? NASAL SINUS SURGERY    ? SHOULDER ARTHROSCOPY Right   ? TONSILLECTOMY AND ADENOIDECTOMY    ? Childhood  ? TRIGGER FINGER RELEASE Left   ? ? ?Family History  ?Problem Relation Age of Onset  ? Cancer Mother   ? Hypertension Mother   ? Cancer Father   ? Hypertension Father   ? Hypertension Brother   ? Diabetes Brother   ? Diabetes Maternal Grandmother   ? ? ?Social History:  reports that he has never smoked. He has never used smokeless tobacco. He reports that he does not drink alcohol and does not use drugs. ? ?Review of Systems: ? ? ?HYPERTENSION: Blood pressure has been well controlled with lisinopril 10mg  ? ?BP Readings from Last 3 Encounters:  ?10/05/21 118/78  ?07/18/21 118/72  ?04/25/21 110/72  ? ? ?HYPERLIPIDEMIA:  ? ?Has consistent control with mg as follows ? ? ?Lab Results  ?Component Value Date  ? CHOL 113 04/20/2021  ? HDL 48.70 04/20/2021  ? LDLCALC 54 04/20/2021  ? LDLDIRECT 91.1 05/27/2014  ? TRIG 54.0 04/20/2021  ? CHOLHDL 2 04/20/2021  ? ?  ?He has cirrhosis of the liver with varices, followed by gastroenterologist and is being evaluated for possible transplant ? ?Lab Results  ?Component Value Date  ? ALT 28 04/20/2021  ? ? Diabetic foot exam in 10/22 ? ? ?He had left 3rd nerve  paralysis likely from diabetes, now recovered ? ? Examination: ?  ?BP 118/78   Temp (!) 77 ?F (25 ?C)   Ht 6\' 1"  (1.854 m)   Wt (!) 317 lb 6.4 oz (144 kg)   SpO2 98%   BMI 41.88 kg/m?   Body mass index is 41.88 k

## 2021-10-06 ENCOUNTER — Encounter: Payer: Self-pay | Admitting: Endocrinology

## 2021-10-23 ENCOUNTER — Other Ambulatory Visit: Payer: Self-pay | Admitting: Endocrinology

## 2021-10-25 ENCOUNTER — Other Ambulatory Visit (HOSPITAL_COMMUNITY): Payer: Self-pay

## 2021-11-01 ENCOUNTER — Other Ambulatory Visit: Payer: Self-pay | Admitting: Endocrinology

## 2021-11-21 ENCOUNTER — Other Ambulatory Visit: Payer: Self-pay | Admitting: Endocrinology

## 2021-11-25 ENCOUNTER — Emergency Department (HOSPITAL_COMMUNITY): Payer: BC Managed Care – PPO

## 2021-11-25 ENCOUNTER — Other Ambulatory Visit: Payer: Self-pay

## 2021-11-25 ENCOUNTER — Observation Stay (HOSPITAL_COMMUNITY)
Admission: EM | Admit: 2021-11-25 | Discharge: 2021-11-26 | Disposition: A | Discharge status: Home / Self Care | Payer: BC Managed Care – PPO | Attending: Family Medicine | Admitting: Family Medicine

## 2021-11-25 ENCOUNTER — Encounter (HOSPITAL_COMMUNITY): Payer: Self-pay | Admitting: Emergency Medicine

## 2021-11-25 DIAGNOSIS — Z6841 Body Mass Index (BMI) 40.0 and over, adult: Secondary | ICD-10-CM | POA: Insufficient documentation

## 2021-11-25 DIAGNOSIS — E119 Type 2 diabetes mellitus without complications: Secondary | ICD-10-CM

## 2021-11-25 DIAGNOSIS — I959 Hypotension, unspecified: Secondary | ICD-10-CM | POA: Diagnosis present

## 2021-11-25 DIAGNOSIS — R42 Dizziness and giddiness: Secondary | ICD-10-CM | POA: Insufficient documentation

## 2021-11-25 DIAGNOSIS — Z7984 Long term (current) use of oral hypoglycemic drugs: Secondary | ICD-10-CM | POA: Diagnosis not present

## 2021-11-25 DIAGNOSIS — R6510 Systemic inflammatory response syndrome (SIRS) of non-infectious origin without acute organ dysfunction: Secondary | ICD-10-CM | POA: Diagnosis not present

## 2021-11-25 DIAGNOSIS — I9589 Other hypotension: Secondary | ICD-10-CM

## 2021-11-25 DIAGNOSIS — A419 Sepsis, unspecified organism: Secondary | ICD-10-CM | POA: Diagnosis not present

## 2021-11-25 DIAGNOSIS — Z91040 Latex allergy status: Secondary | ICD-10-CM | POA: Insufficient documentation

## 2021-11-25 DIAGNOSIS — Z7985 Long-term (current) use of injectable non-insulin antidiabetic drugs: Secondary | ICD-10-CM | POA: Insufficient documentation

## 2021-11-25 DIAGNOSIS — Z79899 Other long term (current) drug therapy: Secondary | ICD-10-CM | POA: Diagnosis not present

## 2021-11-25 DIAGNOSIS — R6520 Severe sepsis without septic shock: Secondary | ICD-10-CM | POA: Insufficient documentation

## 2021-11-25 DIAGNOSIS — N179 Acute kidney failure, unspecified: Secondary | ICD-10-CM | POA: Diagnosis not present

## 2021-11-25 DIAGNOSIS — E669 Obesity, unspecified: Secondary | ICD-10-CM | POA: Diagnosis present

## 2021-11-25 DIAGNOSIS — I10 Essential (primary) hypertension: Secondary | ICD-10-CM | POA: Diagnosis present

## 2021-11-25 DIAGNOSIS — E861 Hypovolemia: Secondary | ICD-10-CM

## 2021-11-25 LAB — COMPREHENSIVE METABOLIC PANEL WITH GFR
ALT: 26 U/L (ref 0–44)
AST: 26 U/L (ref 15–41)
Albumin: 3.6 g/dL (ref 3.5–5.0)
Alkaline Phosphatase: 61 U/L (ref 38–126)
Anion gap: 7 (ref 5–15)
BUN: 23 mg/dL (ref 8–23)
CO2: 26 mmol/L (ref 22–32)
Calcium: 9 mg/dL (ref 8.9–10.3)
Chloride: 103 mmol/L (ref 98–111)
Creatinine, Ser: 1.59 mg/dL — ABNORMAL HIGH (ref 0.61–1.24)
GFR, Estimated: 49 mL/min — ABNORMAL LOW
Glucose, Bld: 121 mg/dL — ABNORMAL HIGH (ref 70–99)
Potassium: 4 mmol/L (ref 3.5–5.1)
Sodium: 136 mmol/L (ref 135–145)
Total Bilirubin: 2.6 mg/dL — ABNORMAL HIGH (ref 0.3–1.2)
Total Protein: 7.2 g/dL (ref 6.5–8.1)

## 2021-11-25 LAB — PROTIME-INR
INR: 1.1 (ref 0.8–1.2)
Prothrombin Time: 14.3 s (ref 11.4–15.2)

## 2021-11-25 LAB — TYPE AND SCREEN
ABO/RH(D): O POS
Antibody Screen: NEGATIVE

## 2021-11-25 LAB — URINALYSIS, ROUTINE W REFLEX MICROSCOPIC
Bacteria, UA: NONE SEEN
Glucose, UA: 150 mg/dL — AB
Hgb urine dipstick: NEGATIVE
Ketones, ur: 5 mg/dL — AB
Leukocytes,Ua: NEGATIVE
Nitrite: NEGATIVE
Protein, ur: 100 mg/dL — AB
Specific Gravity, Urine: 1.025 (ref 1.005–1.030)
pH: 5 (ref 5.0–8.0)

## 2021-11-25 LAB — CBC WITH DIFFERENTIAL/PLATELET
Abs Immature Granulocytes: 0.01 10*3/uL (ref 0.00–0.07)
Basophils Absolute: 0 10*3/uL (ref 0.0–0.1)
Basophils Relative: 1 %
Eosinophils Absolute: 0 10*3/uL (ref 0.0–0.5)
Eosinophils Relative: 0 %
HCT: 36.3 % — ABNORMAL LOW (ref 39.0–52.0)
Hemoglobin: 12.6 g/dL — ABNORMAL LOW (ref 13.0–17.0)
Immature Granulocytes: 0 %
Lymphocytes Relative: 27 %
Lymphs Abs: 0.9 10*3/uL (ref 0.7–4.0)
MCH: 32.6 pg (ref 26.0–34.0)
MCHC: 34.7 g/dL (ref 30.0–36.0)
MCV: 93.8 fL (ref 80.0–100.0)
Monocytes Absolute: 0.2 10*3/uL (ref 0.1–1.0)
Monocytes Relative: 7 %
Neutro Abs: 2 10*3/uL (ref 1.7–7.7)
Neutrophils Relative %: 65 %
Platelets: 57 10*3/uL — ABNORMAL LOW (ref 150–400)
RBC: 3.87 MIL/uL — ABNORMAL LOW (ref 4.22–5.81)
RDW: 14.1 % (ref 11.5–15.5)
WBC: 3.1 10*3/uL — ABNORMAL LOW (ref 4.0–10.5)
nRBC: 0 % (ref 0.0–0.2)

## 2021-11-25 LAB — BRAIN NATRIURETIC PEPTIDE: B Natriuretic Peptide: 19 pg/mL (ref 0.0–100.0)

## 2021-11-25 LAB — LACTIC ACID, PLASMA
Lactic Acid, Venous: 2.3 mmol/L (ref 0.5–1.9)
Lactic Acid, Venous: 1.7 mmol/L (ref 0.5–1.9)

## 2021-11-25 LAB — CBG MONITORING, ED: Glucose-Capillary: 68 mg/dL — ABNORMAL LOW (ref 70–99)

## 2021-11-25 LAB — PROCALCITONIN: Procalcitonin: <0.1 ng/mL

## 2021-11-25 LAB — GLUCOSE, CAPILLARY: Glucose-Capillary: 185 mg/dL — ABNORMAL HIGH (ref 70–99)

## 2021-11-25 LAB — APTT: aPTT: 37 s — ABNORMAL HIGH (ref 24–36)

## 2021-11-25 MED ORDER — TRAMADOL HCL 50 MG PO TABS
50.0000 mg | ORAL_TABLET | Freq: Three times a day (TID) | ORAL | Status: DC | PRN
  Administered 2021-11-26: 50 mg via ORAL
  Filled 2021-11-25: qty 1

## 2021-11-25 MED ORDER — PROPRANOLOL HCL 20 MG PO TABS
20.0000 mg | ORAL_TABLET | Freq: Two times a day (BID) | ORAL | Status: DC
  Filled 2021-11-25: qty 1

## 2021-11-25 MED ORDER — VANCOMYCIN HCL 1750 MG/350ML IV SOLN
1750.0000 mg | INTRAVENOUS | Status: DC
Start: 2021-11-26 — End: 2021-11-26
  Filled 2021-11-25: qty 350

## 2021-11-25 MED ORDER — LEVOCETIRIZINE DIHYDROCHLORIDE 5 MG PO TABS: 5.0000 mg | ORAL_TABLET | Freq: Every evening | ORAL | Status: DC

## 2021-11-25 MED ORDER — LORATADINE 10 MG PO TABS: 10.0000 mg | ORAL_TABLET | Freq: Every evening | ORAL | Status: DC

## 2021-11-25 MED ORDER — MECLIZINE HCL 12.5 MG PO TABS
25.0000 mg | ORAL_TABLET | Freq: Four times a day (QID) | ORAL | Status: DC
  Administered 2021-11-25: 25 mg via ORAL
  Filled 2021-11-25 (×2): qty 2

## 2021-11-25 MED ORDER — CYCLOBENZAPRINE HCL 10 MG PO TABS
10.0000 mg | ORAL_TABLET | Freq: Three times a day (TID) | ORAL | Status: DC
  Administered 2021-11-25 – 2021-11-26 (×2): 10 mg via ORAL
  Filled 2021-11-25 (×2): qty 1

## 2021-11-25 MED ORDER — ONDANSETRON HCL 4 MG PO TABS: 4.0000 mg | ORAL_TABLET | Freq: Four times a day (QID) | ORAL | Status: DC | PRN

## 2021-11-25 MED ORDER — VANCOMYCIN HCL 1500 MG/300ML IV SOLN
1500.0000 mg | Freq: Once | INTRAVENOUS | Status: AC
  Administered 2021-11-25: 1500 mg via INTRAVENOUS
  Filled 2021-11-25: qty 300

## 2021-11-25 MED ORDER — ONDANSETRON HCL 4 MG/2ML IJ SOLN
4.0000 mg | Freq: Four times a day (QID) | INTRAMUSCULAR | Status: DC | PRN
Start: 2021-11-25 — End: 2021-11-26

## 2021-11-25 MED ORDER — INSULIN ASPART 100 UNIT/ML IJ SOLN: 0.0000 [IU] | Freq: Every day | INTRAMUSCULAR | Status: DC

## 2021-11-25 MED ORDER — LACTATED RINGERS IV SOLN: INTRAVENOUS | Status: DC

## 2021-11-25 MED ORDER — TAMSULOSIN HCL 0.4 MG PO CAPS
0.4000 mg | ORAL_CAPSULE | Freq: Every day | ORAL | Status: DC
Start: 2021-11-26 — End: 2021-11-26

## 2021-11-25 MED ORDER — PANTOPRAZOLE SODIUM 40 MG PO TBEC
40.0000 mg | DELAYED_RELEASE_TABLET | Freq: Every day | ORAL | Status: DC
Start: 2021-11-25 — End: 2021-11-26
  Administered 2021-11-25 – 2021-11-26 (×2): 40 mg via ORAL
  Filled 2021-11-25 (×2): qty 1

## 2021-11-25 MED ORDER — ROSUVASTATIN CALCIUM 10 MG PO TABS
5.0000 mg | ORAL_TABLET | Freq: Every evening | ORAL | Status: DC
  Administered 2021-11-25: 5 mg via ORAL
  Filled 2021-11-25: qty 1

## 2021-11-25 MED ORDER — INSULIN ASPART 100 UNIT/ML IJ SOLN: 0.0000 [IU] | Freq: Three times a day (TID) | INTRAMUSCULAR | Status: DC

## 2021-11-25 MED ORDER — SODIUM CHLORIDE 0.9 % IV BOLUS
1000.0000 mL | Freq: Once | INTRAVENOUS | Status: AC
  Administered 2021-11-25: 1000 mL via INTRAVENOUS

## 2021-11-25 MED ORDER — ALBUTEROL SULFATE (2.5 MG/3ML) 0.083% IN NEBU: 3.0000 mL | INHALATION_SOLUTION | RESPIRATORY_TRACT | Status: DC | PRN

## 2021-11-25 MED ORDER — SODIUM CHLORIDE 0.9 % IV SOLN
2.0000 g | Freq: Once | INTRAVENOUS | Status: AC
  Administered 2021-11-25: 2 g via INTRAVENOUS
  Filled 2021-11-25: qty 12.5

## 2021-11-25 MED ORDER — ONDANSETRON 4 MG PO TBDP: 4.0000 mg | ORAL_TABLET | Freq: Three times a day (TID) | ORAL | Status: DC | PRN
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
## 2021-11-25 NOTE — H&P (Signed)
History and Physical    Patient: Darryl Hunt HYQ:[id number] DOB: Oct 16, 1960 DOA: 11/25/2021 DOS: the patient was seen and examined on 11/25/2021 PCP: Tennis Ship, NP  Patient coming from: Home  Chief Complaint:  Chief Complaint  Patient presents with   Hypotension   HPI: Darryl Hunt is a 61 y.o. male with medical history significant of type 2 diabetes, hypertension, GERD, [place], OSA, hyperlipidemia.  Patient woke up this morning feeling well.  He went outside and noticed that everything appeared extremely bright.  He had worsening symptoms in the car while driving and had difficulty telling what the different lights were at the stoplight.  Denies any blurred vision or double vision, just that everything appeared extremely bright.  When he arrived home he noted that he was lightheaded and had his wife take him to the urgent care.  There, his blood pressure was noted to be 70/40 and the patient was directed to the emergency department for evaluation.  Currently, symptoms appear completely resolved.  He denies fevers, chills, nausea, vomiting.  He has had no recent medication changes.  Does report some prolonged diarrhea over the past couple of weeks  Review of Systems: As mentioned in the history of present illness. All other systems reviewed and are negative. Past Medical History:  Diagnosis Date   Arthritis    Back   Diabetes mellitus without complication (HCC)    Type II   Fatty liver    GERD (gastroesophageal reflux disease)    History of kidney stones    Hyperlipidemia    Hypertension    OSA on CPAP    Pinched nerve in neck    Seasonal allergies    Varicose vein of leg    bilateral   Wears glasses    Wears hearing aid in both ears    Past Surgical History:  Procedure Laterality Date   CHOLECYSTECTOMY     COLONOSCOPY     CYSTOSCOPY WITH RETROGRADE PYELOGRAM, URETEROSCOPY AND STENT PLACEMENT Left 03/19/2019   Procedure: CYSTOSCOPY WITH LEFT  RETROGRADE URETEROSCOPY  WITH HOLMIUM LASER ANDPOSSIBLE STENT STENT PLACEMENT;  Surgeon: Bjorn Pippin, MD;  Location: [facility];  Service: Urology;  Laterality: Left;   FRACTURE SURGERY Right    Arm childhood x2   KNEE ARTHROSCOPY Left    NASAL SINUS SURGERY     SHOULDER ARTHROSCOPY Right    TONSILLECTOMY AND ADENOIDECTOMY     Childhood   TRIGGER FINGER RELEASE Left    Social History:  reports that he has never smoked. He has never used smokeless tobacco. He reports that he does not drink alcohol and does not use drugs.  Allergies  Allergen Reactions   Latex Other (See Comments)    Pt states he is not allergic to latex, and does not want it because his wife is allergic and If he gets it on him she breaks out.    Family History  Problem Relation Age of Onset   Cancer Mother    Hypertension Mother    Cancer Father    Hypertension Father    Hypertension Brother    Diabetes Brother    Diabetes Maternal Grandmother     Prior to Admission medications   Medication Sig Start Date End Date Taking? Authorizing Provider  albuterol (VENTOLIN HFA) 108 (90 Base) MCG/ACT inhaler Inhale 2 puffs into the lungs every 4 (four) hours as needed. 09/22/21  Yes [provider]  Cholecalciferol (VITAMIN D) 125 MCG (5000 UT) CAPS Take  5,000 Units by mouth 3 (three) times daily.   Yes [provider]  cyclobenzaprine (FLEXERIL) 10 MG tablet Take 10 mg by mouth 3 (three) times daily. 10/05/21  Yes [provider]  docusate sodium (COLACE) 100 MG capsule Take 100 mg by mouth every evening.   Yes [provider]  glimepiride (AMARYL) 4 MG tablet TAKE 1/2 TABLET BY MOUTH TWICE A DAY 11/21/21  Yes Reather Littler, MD  levocetirizine (XYZAL) 5 MG tablet Take 5 mg by mouth every evening.    Yes [provider]  lisinopril (ZESTRIL) 10 MG tablet TAKE 1 TABLET BY MOUTH  DAILY 02/24/21  Yes Reather Littler, MD  meclizine (ANTIVERT) 25 MG tablet Take 25 mg by mouth 4 (four) times daily.  10/05/21  Yes [provider]  metFORMIN (GLUCOPHAGE-XR) 500 MG 24 hr tablet Take 4 tablets (2,000 mg total) by mouth daily with supper. 01/20/21  Yes Reather Littler, MD  mometasone (NASONEX) 50 MCG/ACT nasal spray Place 2 sprays into the nose daily as needed (allergies).  04/06/15  Yes [provider]  Omega-3 Fatty Acids (FISH OIL ULTRA) 1400 MG CAPS Take 2,800 mg by mouth 2 (two) times daily. Take 2 capsules by mouth twice daily.   Yes [provider]  ondansetron (ZOFRAN-ODT) 4 MG disintegrating tablet Take 4 mg by mouth every 8 (eight) hours as needed for nausea or vomiting. 04/29/21  Yes [provider]  OZEMPIC, 1 MG/DOSE, 4 MG/3ML SOPN INJECT  INTO THE SKIN ONCE A WEEK Patient taking differently: Inject 1 mg into the skin once a week. On Tuesdays 11/02/21  Yes Reather Littler, MD  pantoprazole (PROTONIX) 40 MG tablet Take 40 mg by mouth daily. 04/23/21  Yes [provider]  propranolol (INDERAL) 20 MG tablet Take 20 mg by mouth 2 (two) times daily. 10/18/21  Yes [provider]  rosuvastatin (CRESTOR) 5 MG tablet Take 1 tablet (5 mg total) by mouth daily. 01/20/21  Yes Reather Littler, MD  tadalafil (CIALIS) 5 MG tablet Take 5 mg by mouth daily. 10/23/21  Yes [provider]  tamsulosin (FLOMAX) 0.4 MG CAPS capsule Take 0.4 mg by mouth daily after supper.   Yes [provider]  tiZANidine (ZANAFLEX) 4 MG tablet Take 4 mg by mouth every 8 (eight) hours as needed. 11/06/21  Yes [provider]  traMADol (ULTRAM) 50 MG tablet Take 50 mg by mouth 3 (three) times daily as needed. 11/06/21  Yes [provider]  Continuous Blood Gluc Receiver (FREESTYLE LIBRE 2 READER) DEVI 1 each by Other route every 14 (fourteen) days. 02/12/20   Reather Littler, MD  Continuous Blood Gluc Sensor (FREESTYLE LIBRE 3 SENSOR) MISC APPLY 1 SENSOR ON UPPER ARM EVERY 14 DAYS FOR CONTINUOUS GLUCOSE MONITORING 10/24/21   Reather Littler, MD  Insulin Pen Needle 32G  X 4 MM MISC Use 1 needle daily. 02/21/17   Reather Littler, MD    Physical Exam: Vitals:   11/25/21 1430 11/25/21 1500 11/25/21 1530 11/25/21 1808  BP: 112/66 125/72 118/71 124/64  Pulse: 69 66 62 78  Resp: 17 (!) 21 14 (!) 21  Temp:    97.9 F (36.6 C)  TempSrc:    Oral  SpO2: 100% 99% 100% 99%  Weight:      Height:       General: Older male. Awake and alert and oriented x3. No acute cardiopulmonary distress.  HEENT: Normocephalic atraumatic.  Right and left ears normal in appearance.  Pupils equal, round, reactive  to light. Extraocular muscles are intact. Sclerae anicteric and noninjected.  Moist mucosal membranes. No mucosal lesions.  Neck: Neck supple without lymphadenopathy. No carotid bruits. No masses palpated.  Cardiovascular: Regular rate with normal S1-S2 sounds. No murmurs, rubs, gallops auscultated. No JVD.  Respiratory: Good respiratory effort with no wheezes, rales, rhonchi. Lungs clear to auscultation bilaterally.  No accessory muscle use. Abdomen: Soft, nontender, nondistended. Active bowel sounds. No masses or hepatosplenomegaly  Skin: No rashes, lesions, or ulcerations.  Dry, warm to touch. 2+ dorsalis pedis and radial pulses. Musculoskeletal: No calf or leg pain. All major joints not erythematous nontender.  No upper or lower joint deformation.  Good ROM.  No contractures  Psychiatric: Intact judgment and insight. Pleasant and cooperative. Neurologic: No focal neurological deficits. Strength is 5/5 and symmetric in upper and lower extremities.  Cranial nerves II through XII are grossly intact.  Data Reviewed: Results for orders placed or performed during the hospital encounter of 11/25/21 (from the past 24 hour(s))  Comprehensive metabolic panel     Status: Abnormal   Collection Time: 11/25/21  1:22 PM  Result Value Ref Range   Sodium 136 135 - 145 mmol/L   Potassium 4.0 3.5 - 5.1 mmol/L   Chloride 103 98 - 111 mmol/L   CO2 26 22 - 32 mmol/L   Glucose, Bld 121 (H) 70  - 99 mg/dL   BUN 23 8 - 23 mg/dL   Creatinine, Ser 1.611.59 (H) 0.61 - 1.24 mg/dL   Calcium 9.0 8.9 - 09.610.3 mg/dL   Total Protein 7.2 6.5 - 8.1 g/dL   Albumin 3.6 3.5 - 5.0 g/dL   AST 26 15 - 41 U/L   ALT 26 0 - 44 U/L   Alkaline Phosphatase 61 38 - 126 U/L   Total Bilirubin 2.6 (H) 0.3 - 1.2 mg/dL   GFR, Estimated 49 (L) >60 mL/min   Anion gap 7 5 - 15  CBC with Differential     Status: Abnormal   Collection Time: 11/25/21  1:22 PM  Result Value Ref Range   WBC 3.1 (L) 4.0 - 10.5 K/uL   RBC 3.87 (L) 4.22 - 5.81 MIL/uL   Hemoglobin 12.6 (L) 13.0 - 17.0 g/dL   HCT 04.536.3 (L) 40.939.0 - 81.152.0 %   MCV 93.8 80.0 - 100.0 fL   MCH 32.6 26.0 - 34.0 pg   MCHC 34.7 30.0 - 36.0 g/dL   RDW 91.414.1 78.211.5 - 95.615.5 %   Platelets 57 (L) 150 - 400 K/uL   nRBC 0.0 0.0 - 0.2 %   Neutrophils Relative % 65 %   Neutro Abs 2.0 1.7 - 7.7 K/uL   Lymphocytes Relative 27 %   Lymphs Abs 0.9 0.7 - 4.0 K/uL   Monocytes Relative 7 %   Monocytes Absolute 0.2 0.1 - 1.0 K/uL   Eosinophils Relative 0 %   Eosinophils Absolute 0.0 0.0 - 0.5 K/uL   Basophils Relative 1 %   Basophils Absolute 0.0 0.0 - 0.1 K/uL   Immature Granulocytes 0 %   Abs Immature Granulocytes 0.01 0.00 - 0.07 K/uL  Brain natriuretic peptide     Status: None   Collection Time: 11/25/21  1:22 PM  Result Value Ref Range   B Natriuretic Peptide 19.0 0.0 - 100.0 pg/mL  Lactic acid, plasma     Status: Abnormal   Collection Time: 11/25/21  1:22 PM  Result Value Ref Range   Lactic Acid, Venous 2.3 (HH) 0.5 - 1.9 mmol/L  Type  and screen [facility]     Status: None   Collection Time: 11/25/21  1:22 PM  Result Value Ref Range   ABO/RH(D) O POS    Antibody Screen NEG    Sample Expiration      11/28/2021,2359 Performed at [facility], [address] [place], [place] [address]   Protime-INR     Status: None   Collection Time: 11/25/21  1:22 PM  Result Value Ref Range   Prothrombin Time 14.3 11.4 - 15.2 seconds   INR 1.1 0.8 - 1.2  APTT      Status: Abnormal   Collection Time: 11/25/21  1:22 PM  Result Value Ref Range   aPTT 37 (H) 24 - 36 seconds  Blood Culture (routine x 2)     Status: None (Preliminary result)   Collection Time: 11/25/21  1:22 PM   Specimen: BLOOD  Result Value Ref Range   Specimen Description BLOOD RIGHT ANTECUBITAL    Special Requests      BOTTLES DRAWN AEROBIC AND ANAEROBIC Blood Culture results may not be optimal due to an excessive volume of blood received in culture bottles Performed at [facility], [address], [place], [place] [address]    Culture PENDING    Report Status PENDING   Procalcitonin - Baseline     Status: None   Collection Time: 11/25/21  1:22 PM  Result Value Ref Range   Procalcitonin <0.10 ng/mL  Urinalysis, Routine w reflex microscopic Urine, Clean Catch     Status: Abnormal   Collection Time: 11/25/21  2:27 PM  Result Value Ref Range   Color, Urine AMBER (A) YELLOW   APPearance CLEAR CLEAR   Specific Gravity, Urine 1.025 1.005 - 1.030   pH 5.0 5.0 - 8.0   Glucose, UA 150 (A) NEGATIVE mg/dL   Hgb urine dipstick NEGATIVE NEGATIVE   Bilirubin Urine SMALL (A) NEGATIVE   Ketones, ur 5 (A) NEGATIVE mg/dL   Protein, ur 401 (A) NEGATIVE mg/dL   Nitrite NEGATIVE NEGATIVE   Leukocytes,Ua NEGATIVE NEGATIVE   RBC / HPF 0-5 0 - 5 RBC/hpf   WBC, UA 6-10 0 - 5 WBC/hpf   Bacteria, UA NONE SEEN NONE SEEN   Squamous Epithelial / LPF 0-5 0 - 5   Mucus PRESENT    Hyaline Casts, UA PRESENT   Lactic acid, plasma     Status: None   Collection Time: 11/25/21  4:35 PM  Result Value Ref Range   Lactic Acid, Venous 1.7 0.5 - 1.9 mmol/L  Blood Culture (routine x 2)     Status: None (Preliminary result)   Collection Time: 11/25/21  4:35 PM   Specimen: Left Antecubital; Blood  Result Value Ref Range   Specimen Description      LEFT ANTECUBITAL BOTTLES DRAWN AEROBIC AND ANAEROBIC   Special Requests      Blood Culture adequate volume Performed at [facility], [address], [place], [place] [address]    Culture PENDING    Report Status PENDING   CBG monitoring, ED     Status: Abnormal   Collection Time: 11/25/21  4:42 PM  Result Value Ref Range   Glucose-Capillary 68 (L) 70 - 99 mg/dL   DG Chest 2 View  Result Date: 11/25/2021 CLINICAL DATA:  Dizziness, weakness EXAM: CHEST - 2 VIEW COMPARISON:  09/05/2021 FINDINGS: Cardiac size is within normal limits. There are no signs of pulmonary edema or new focal infiltrates. Small transverse linear density  at the left cardiophrenic angle has not changed, possibly scarring. There is no pleural effusion or pneumothorax. IMPRESSION: No active cardiopulmonary disease. Electronically Signed   By: Ernie Avena M.D.   On: 11/25/2021 14:31   CT Head Wo Contrast  Result Date: 11/25/2021 CLINICAL DATA:  61 year old male with dizziness and weakness. EXAM: CT HEAD WITHOUT CONTRAST TECHNIQUE: Contiguous axial images were obtained from the base of the skull through the vertex without intravenous contrast. RADIATION DOSE REDUCTION: This exam was performed according to the departmental dose-optimization program which includes automated exposure control, adjustment of the mA and/or kV according to patient size and/or use of iterative reconstruction technique. COMPARISON:  04/09/2021 CT FINDINGS: Brain: No evidence of acute infarction, hemorrhage, hydrocephalus, extra-axial collection or mass lesion/mass effect. Mild periventricular white matter hypodensities likely represent chronic small-vessel white matter ischemic changes. Vascular: No hyperdense vessel or unexpected calcification. Skull: Normal. Negative for fracture or focal lesion. Sinuses/Orbits: No acute finding. Other: None. IMPRESSION: 1. No evidence of acute intracranial abnormality. 2. Mild chronic small-vessel white matter ischemic changes. Electronically Signed   By: Harmon Pier M.D.   On: 11/25/2021 14:23     Assessment and Plan: No notes have been  filed under this hospital service. Service: Hospitalist  Principal Problem:   SIRS (systemic inflammatory response syndrome) (HCC) Active Problems:   Controlled type 2 diabetes mellitus without complication, without long-term current use of insulin (HCC)   Essential hypertension   Obesity, unspecified   AKI (acute kidney injury) (HCC)   Hypotension  Patient discussed with EDP including history, emergency department course, and treatment.  SIRS Patient started on broad-spectrum antibiotic.  We will check procalcitonin and adjust management of antibiotics following this. Blood cultures and urine cultures obtained Recheck CBC in the morning Patient does have a history of pancytopenia, making reliance on white blood cell count difficult AKI IV fluids Recheck creatinine in the morning Hypotension in the setting of hypertension Normotensive now after fluid Continue antihypertensives Chronic pain Continue pain management regimen Type 2 diabetes Hold oral hypoglycemics Sliding scale insulin   Advance Care Planning:   Code Status: Not on file full code  Consults:   Family Communication: Wife present during interview and exam  Severity of Illness: The appropriate patient status for this patient is INPATIENT. Inpatient status is judged to be reasonable and necessary in order to provide the required intensity of service to ensure the patient's safety. The patient's presenting symptoms, physical exam findings, and initial radiographic and laboratory data in the context of their chronic comorbidities is felt to place them at high risk for further clinical deterioration. Furthermore, it is not anticipated that the patient will be medically stable for discharge from the hospital within 2 midnights of admission.   * I certify that at the point of admission it is my clinical judgment that the patient will require inpatient hospital care spanning beyond 2 midnights from the point of admission  due to high intensity of service, high risk for further deterioration and high frequency of surveillance required.*  Author: Levie Heritage, DO 11/25/2021 6:41 PM  For on call review [email or web address].

## 2021-11-26 ENCOUNTER — Other Ambulatory Visit: Payer: Self-pay | Admitting: Endocrinology

## 2021-11-26 DIAGNOSIS — R6510 Systemic inflammatory response syndrome (SIRS) of non-infectious origin without acute organ dysfunction: Secondary | ICD-10-CM | POA: Diagnosis not present

## 2021-11-26 LAB — BASIC METABOLIC PANEL
Anion gap: 8 (ref 5–15)
BUN: 19 mg/dL (ref 8–23)
CO2: 23 mmol/L (ref 22–32)
Calcium: 8.8 mg/dL — ABNORMAL LOW (ref 8.9–10.3)
Chloride: 107 mmol/L (ref 98–111)
Creatinine, Ser: 0.97 mg/dL (ref 0.61–1.24)
GFR, Estimated: >60 mL/min (ref >60)
Glucose, Bld: 112 mg/dL — ABNORMAL HIGH (ref 70–99)
Potassium: 4 mmol/L (ref 3.5–5.1)
Sodium: 138 mmol/L (ref 135–145)

## 2021-11-26 LAB — GLUCOSE, CAPILLARY
Glucose-Capillary: 118 mg/dL — ABNORMAL HIGH (ref 70–99)
Glucose-Capillary: 179 mg/dL — ABNORMAL HIGH (ref 70–99)

## 2021-11-26 LAB — HIV ANTIBODY (ROUTINE TESTING W REFLEX): HIV Screen 4th Generation wRfx: NONREACTIVE

## 2021-11-26 LAB — CBC
HCT: 36.1 % — ABNORMAL LOW (ref 39.0–52.0)
Hemoglobin: 12.3 g/dL — ABNORMAL LOW (ref 13.0–17.0)
MCH: 32.3 pg (ref 26.0–34.0)
MCHC: 34.1 g/dL (ref 30.0–36.0)
MCV: 94.8 fL (ref 80.0–100.0)
Platelets: 60 10*3/uL — ABNORMAL LOW (ref 150–400)
RBC: 3.81 MIL/uL — ABNORMAL LOW (ref 4.22–5.81)
RDW: 14 % (ref 11.5–15.5)
WBC: 4.3 10*3/uL (ref 4.0–10.5)
nRBC: 0 % (ref 0.0–0.2)

## 2021-11-26 LAB — PROCALCITONIN: Procalcitonin: <0.1 ng/mL

## 2021-11-26 MED ORDER — PROPRANOLOL HCL 20 MG PO TABS: 20.0000 mg | ORAL_TABLET | Freq: Two times a day (BID) | ORAL | 0 refills | Status: AC

## 2021-11-26 MED ORDER — SODIUM CHLORIDE 0.9 % IV SOLN
2.0000 g | Freq: Three times a day (TID) | INTRAVENOUS | Status: DC
  Administered 2021-11-26: 2 g via INTRAVENOUS
  Filled 2021-11-26: qty 12.5

## 2021-11-26 MED ORDER — VANCOMYCIN HCL 1500 MG/300ML IV SOLN
1500.0000 mg | Freq: Two times a day (BID) | INTRAVENOUS | Status: DC
  Administered 2021-11-26: 1500 mg via INTRAVENOUS
  Filled 2021-11-26: qty 300

## 2021-11-26 MED ORDER — TAMSULOSIN HCL 0.4 MG PO CAPS: 0.4000 mg | ORAL_CAPSULE | Freq: Every day | ORAL | 0 refills | Status: AC

## 2021-11-26 MED ORDER — LISINOPRIL 10 MG PO TABS: 10.0000 mg | ORAL_TABLET | Freq: Every day | ORAL | 0 refills | Status: DC

## 2021-11-26 NOTE — Progress Notes (Signed)
Ng Discharge Note  Admit Date:  11/25/2021 Discharge date: 11/26/2021   Pearletha Furl to be D/C'd Home per MD order.  AVS completed. Patient/caregiver able to verbalize understanding.  Discharge Medication: Allergies as of 11/26/2021       Reactions   Latex Other (See Comments)   Pt states he is not allergic to latex, and does not want it because his wife is allergic and If he gets it on him she breaks out.        Medication List     STOP taking these medications    cyclobenzaprine 10 MG tablet Commonly known as: FLEXERIL   docusate sodium 100 MG capsule Commonly known as: COLACE       TAKE these medications    albuterol 108 (90 Base) MCG/ACT inhaler Commonly known as: VENTOLIN HFA Inhale 2 puffs into the lungs every 4 (four) hours as needed.   Fish Oil Ultra 1400 MG Caps Take 2,800 mg by mouth 2 (two) times daily. Take 2 capsules by mouth twice daily.   FreeStyle Libre 2 Reader [employer] 1 each by Other route every 14 (fourteen) days.   FreeStyle Libre 3 Sensor Misc APPLY 1 SENSOR ON UPPER ARM EVERY 14 DAYS FOR CONTINUOUS GLUCOSE MONITORING   glimepiride 4 MG tablet Commonly known as: AMARYL TAKE 1/2 TABLET BY MOUTH TWICE A DAY   Insulin Pen Needle 32G X 4 MM Misc Use 1 needle daily.   levocetirizine 5 MG tablet Commonly known as: XYZAL Take 5 mg by mouth every evening.   lisinopril 10 MG tablet Commonly known as: ZESTRIL Take 1 tablet (10 mg total) by mouth daily. Hold if SBP < 110 mmhg What changed: additional instructions   meclizine 25 MG tablet Commonly known as: ANTIVERT Take 25 mg by mouth 4 (four) times daily.   metFORMIN 500 MG 24 hr tablet Commonly known as: GLUCOPHAGE-XR Take 4 tablets (2,000 mg total) by mouth daily with supper.   mometasone 50 MCG/ACT nasal spray Commonly known as: NASONEX Place 2 sprays into the nose daily as needed (allergies).   ondansetron 4 MG disintegrating tablet Commonly known as: ZOFRAN-ODT Take 4 mg by  mouth every 8 (eight) hours as needed for nausea or vomiting.   Ozempic (1 MG/DOSE) 4 MG/3ML Sopn Generic drug: Semaglutide (1 MG/DOSE) INJECT 1MG  INTO THE SKIN ONCE A WEEK What changed: See the new instructions.   pantoprazole 40 MG tablet Commonly known as: PROTONIX Take 40 mg by mouth daily.   propranolol 20 MG tablet Commonly known as: INDERAL Take 1 tablet (20 mg total) by mouth 2 (two) times daily. Hold if SBP < 110 mmhg or if HR < 60 bpm What changed: additional instructions   rosuvastatin 5 MG tablet Commonly known as: Crestor Take 1 tablet (5 mg total) by mouth daily.   tadalafil 5 MG tablet Commonly known as: CIALIS Take 5 mg by mouth daily.   tamsulosin 0.4 MG Caps capsule Commonly known as: FLOMAX Take 1 capsule (0.4 mg total) by mouth daily after supper. Start taking on: Nov 28, 2021 What changed: These instructions start on Nov 28, 2021. If you are unsure what to do until then, ask your doctor or other care provider.   tiZANidine 4 MG tablet Commonly known as: ZANAFLEX Take 4 mg by mouth every 8 (eight) hours as needed.   traMADol 50 MG tablet Commonly known as: ULTRAM Take 50 mg by mouth 3 (three) times daily as needed.   Vitamin D 125 MCG (  5000 UT) Caps Take 5,000 Units by mouth 3 (three) times daily.        Discharge Assessment: Vitals:   11/26/21 0416 11/26/21 1406  BP: 119/68 (!) 141/78  Pulse: 87 87  Resp: 20 20  Temp: 97.9 F (36.6 C) (!) 97.4 F (36.3 C)  SpO2: 96% 98%   Skin clean, dry and intact without evidence of skin break down, no evidence of skin tears noted. IV catheter discontinued intact. Site without signs and symptoms of complications - no redness or edema noted at insertion site, patient denies c/o pain - only slight tenderness at site.  Dressing with slight pressure applied.  D/c Instructions-Education: Discharge instructions given to patient/family with verbalized understanding. D/c education completed with  patient/family including follow up instructions, medication list, d/c activities limitations if indicated, with other d/c instructions as indicated by MD - patient able to verbalize understanding, all questions fully answered. Patient instructed to return to ED, call 911, or call MD for any changes in condition.  Patient escorted via [place], and D/C home via private auto.  Tsosie Billing, LPN [phone] PM

## 2021-11-26 NOTE — Discharge Summary (Addendum)
Darryl Hunt, is a 61 y.o. male  DOB [birth year]  MRN [id number].  Admission date:  11/25/2021  Admitting Physician  Levie Heritage, DO  Discharge Date:  11/26/2021   Primary MD  Tennis Ship, NP  Recommendations for primary care physician for things to follow:   1)Please Hold Lisinopril and Propranolol if SBP/systolic blood pressure is less than < 110 mmhg  2)Please note that there are been several changes to your medications 3)Repeat CBC and BMP blood test with the primary care physician within a week advised  Admission Diagnosis  SIRS (systemic inflammatory response syndrome) (HCC) [R65.10] AKI (acute kidney injury) (HCC) [N17.9] Hypotension, unspecified hypotension type [I95.9] Sepsis with acute organ dysfunction, due to unspecified organism, unspecified type, unspecified whether septic shock present (HCC) [A41.9, R65.20]   Discharge Diagnosis  SIRS (systemic inflammatory response syndrome) (HCC) [R65.10] AKI (acute kidney injury) (HCC) [N17.9] Hypotension, unspecified hypotension type [I95.9] Sepsis with acute organ dysfunction, due to unspecified organism, unspecified type, unspecified whether septic shock present (HCC) [A41.9, R65.20]    Principal Problem:   SIRS (systemic inflammatory response syndrome) (HCC) Active Problems:   Controlled type 2 diabetes mellitus without complication, without long-term current use of insulin (HCC)   Essential hypertension   Obesity, unspecified   AKI (acute kidney injury) (HCC)   Hypotension      Past Medical History:  Diagnosis Date   Arthritis    Back   Diabetes mellitus without complication (HCC)    Type II   Fatty liver    GERD (gastroesophageal reflux disease)    History of kidney stones    Hyperlipidemia    Hypertension    OSA on CPAP    Pinched nerve in neck    Seasonal allergies    Varicose vein of leg    bilateral   Wears glasses     Wears hearing aid in both ears     Past Surgical History:  Procedure Laterality Date   CHOLECYSTECTOMY     COLONOSCOPY     CYSTOSCOPY WITH RETROGRADE PYELOGRAM, URETEROSCOPY AND STENT PLACEMENT Left 03/19/2019   Procedure: CYSTOSCOPY WITH LEFT  RETROGRADE URETEROSCOPY WITH HOLMIUM LASER ANDPOSSIBLE STENT STENT PLACEMENT;  Surgeon: Bjorn Pippin, MD;  Location: [facility];  Service: Urology;  Laterality: Left;   FRACTURE SURGERY Right    Arm childhood x2   KNEE ARTHROSCOPY Left    NASAL SINUS SURGERY     SHOULDER ARTHROSCOPY Right    TONSILLECTOMY AND ADENOIDECTOMY     Childhood   TRIGGER FINGER RELEASE Left      HPI  from the history and physical done on the day of admission:    HPI: Darryl Hunt is a 61 y.o. male with medical history significant of type 2 diabetes, hypertension, GERD, [place], OSA, hyperlipidemia.  Patient woke up this morning feeling well.  He went outside and noticed that everything appeared extremely bright.  He had worsening symptoms in the car while driving and had difficulty telling what the different lights were  at the stoplight.  Denies any blurred vision or double vision, just that everything appeared extremely bright.  When he arrived home he noted that he was lightheaded and had his wife take him to the urgent care.  There, his blood pressure was noted to be 70/40 and the patient was directed to the emergency department for evaluation.  Currently, symptoms appear completely resolved.  He denies fevers, chills, nausea, vomiting.  He has had no recent medication changes.  Does report some prolonged diarrhea over the past couple of weeks   Review of Systems: As mentioned in the history of present illness. All other systems reviewed and are negative.    Hospital Course:   Assessment and Plan: 1)Hypotension----in this patient with a history of hypertension who PTA took propanolol and lisinopril -In retrospect this was due to severe dehydration  in the setting of diarrhea compounded by lisinopril and propanolol use -BP normalized with IV fluids -May restart lisinopril and propranolol in 24 to 48 hours with hold parameters -As per patient he had an unremarkable echocardiogram within the last 8 months in [place]  2)Diarrhea----??? IBS-D compounded by Colace use -Stop Colace -No fevers, chills abdominal pains or leukocytosis or other evidence of acute infection -No further loose stools since stopping Colace  3)AKI----acute kidney injury--- due to #1 above in the setting of diarrhea compounded by lisinopril use -With hydration -Creatinine is down to 0.97 from 1.59 on admission , renally adjust medications, avoid nephrotoxic agents / dehydration  / hypotension  4)??SIRS----no evidence of acute infection -Chest x-ray without acute infection -UA not conclusive for UTI -Patient received Vanco and cefepime -Urine and blood cultures pending -No leukocytosis and procalcitonin x2 done over 12 hours apart are not elevated -Lactic acidosis resolved with IV fluids -No evidence of acute infection so no further antibiotics needed at this time --Patient ruled out for sepsis  5)DM2-continue home regimen follow-up with PCP for further adjustments  6)Morbid Obesity- -Low calorie diet, portion control and increase physical activity discussed with patient -Body mass index is 41.48 kg/m.   Discharge Condition: Stable  Follow UP--PCP for repeat CBC and BMP blood test and BP recheck  Diet and Activity recommendation:  As advised  Discharge Instructions    Discharge Instructions     Call MD for:  difficulty breathing, headache or visual disturbances   Complete by: As directed    Call MD for:  persistant dizziness or light-headedness   Complete by: As directed    Call MD for:  persistant nausea and vomiting   Complete by: As directed    Call MD for:  temperature >100.4   Complete by: As directed    Diet - low sodium heart healthy    Complete by: As directed    Discharge instructions   Complete by: As directed    1)Please Hold Lisinopril and Propranolol if SBP/systolic blood pressure is less than < 110 mmhg  2)Please note that there are been several changes to your medications 3)Repeat CBC and BMP blood test with the primary care physician within a week advised   Increase activity slowly   Complete by: As directed         Discharge Medications     Allergies as of 11/26/2021       Reactions   Latex Other (See Comments)   Pt states he is not allergic to latex, and does not want it because his wife is allergic and If he gets it on him she breaks out.  Medication List     STOP taking these medications    cyclobenzaprine 10 MG tablet Commonly known as: FLEXERIL   docusate sodium 100 MG capsule Commonly known as: COLACE       TAKE these medications    albuterol 108 (90 Base) MCG/ACT inhaler Commonly known as: VENTOLIN HFA Inhale 2 puffs into the lungs every 4 (four) hours as needed.   Fish Oil Ultra 1400 MG Caps Take 2,800 mg by mouth 2 (two) times daily. Take 2 capsules by mouth twice daily.   FreeStyle Libre 2 Reader [employer] 1 each by Other route every 14 (fourteen) days.   FreeStyle Libre 3 Sensor Misc APPLY 1 SENSOR ON UPPER ARM EVERY 14 DAYS FOR CONTINUOUS GLUCOSE MONITORING   glimepiride 4 MG tablet Commonly known as: AMARYL TAKE 1/2 TABLET BY MOUTH TWICE A DAY   Insulin Pen Needle 32G X 4 MM Misc Use 1 needle daily.   levocetirizine 5 MG tablet Commonly known as: XYZAL Take 5 mg by mouth every evening.   lisinopril 10 MG tablet Commonly known as: ZESTRIL Take 1 tablet (10 mg total) by mouth daily. Hold if SBP < 110 mmhg What changed: additional instructions   meclizine 25 MG tablet Commonly known as: ANTIVERT Take 25 mg by mouth 4 (four) times daily.   metFORMIN 500 MG 24 hr tablet Commonly known as: GLUCOPHAGE-XR Take 4 tablets (2,000 mg total) by mouth daily with  supper.   mometasone 50 MCG/ACT nasal spray Commonly known as: NASONEX Place 2 sprays into the nose daily as needed (allergies).   ondansetron 4 MG disintegrating tablet Commonly known as: ZOFRAN-ODT Take 4 mg by mouth every 8 (eight) hours as needed for nausea or vomiting.   Ozempic (1 MG/DOSE) 4 MG/3ML Sopn Generic drug: Semaglutide (1 MG/DOSE) INJECT 1MG  INTO THE SKIN ONCE A WEEK What changed: See the new instructions.   pantoprazole 40 MG tablet Commonly known as: PROTONIX Take 40 mg by mouth daily.   propranolol 20 MG tablet Commonly known as: INDERAL Take 1 tablet (20 mg total) by mouth 2 (two) times daily. Hold if SBP < 110 mmhg or if HR < 60 bpm What changed: additional instructions   rosuvastatin 5 MG tablet Commonly known as: Crestor Take 1 tablet (5 mg total) by mouth daily.   tadalafil 5 MG tablet Commonly known as: CIALIS Take 5 mg by mouth daily.   tamsulosin 0.4 MG Caps capsule Commonly known as: FLOMAX Take 1 capsule (0.4 mg total) by mouth daily after supper. Start taking on: Nov 28, 2021 What changed: These instructions start on Nov 28, 2021. If you are unsure what to do until then, ask your doctor or other care provider.   tiZANidine 4 MG tablet Commonly known as: ZANAFLEX Take 4 mg by mouth every 8 (eight) hours as needed.   traMADol 50 MG tablet Commonly known as: ULTRAM Take 50 mg by mouth 3 (three) times daily as needed.   Vitamin D 125 MCG (5000 UT) Caps Take 5,000 Units by mouth 3 (three) times daily.       Major procedures and Radiology Reports - PLEASE review detailed and final reports for all details, in brief -   DG Chest 2 View  Result Date: 11/25/2021 CLINICAL DATA:  Dizziness, weakness EXAM: CHEST - 2 VIEW COMPARISON:  09/05/2021 FINDINGS: Cardiac size is within normal limits. There are no signs of pulmonary edema or new focal infiltrates. Small transverse linear density at the left cardiophrenic angle has not  changed, possibly  scarring. There is no pleural effusion or pneumothorax. IMPRESSION: No active cardiopulmonary disease. Electronically Signed   By: Ernie AvenaPalani  Rathinasamy M.D.   On: 11/25/2021 14:31   CT Head Wo Contrast  Result Date: 11/25/2021 CLINICAL DATA:  61 year old male with dizziness and weakness. EXAM: CT HEAD WITHOUT CONTRAST TECHNIQUE: Contiguous axial images were obtained from the base of the skull through the vertex without intravenous contrast. RADIATION DOSE REDUCTION: This exam was performed according to the departmental dose-optimization program which includes automated exposure control, adjustment of the mA and/or kV according to patient size and/or use of iterative reconstruction technique. COMPARISON:  04/09/2021 CT FINDINGS: Brain: No evidence of acute infarction, hemorrhage, hydrocephalus, extra-axial collection or mass lesion/mass effect. Mild periventricular white matter hypodensities likely represent chronic small-vessel white matter ischemic changes. Vascular: No hyperdense vessel or unexpected calcification. Skull: Normal. Negative for fracture or focal lesion. Sinuses/Orbits: No acute finding. Other: None. IMPRESSION: 1. No evidence of acute intracranial abnormality. 2. Mild chronic small-vessel white matter ischemic changes. Electronically Signed   By: Harmon PierJeffrey  Hu M.D.   On: 11/25/2021 14:23    Micro Results   Recent Results (from the past 240 hour(s))  Blood Culture (routine x 2)     Status: None (Preliminary result)   Collection Time: 11/25/21  1:22 PM   Specimen: BLOOD  Result Value Ref Range Status   Specimen Description   Final    BLOOD RIGHT ANTECUBITAL Performed at [facility], [address] [place], [place] [address]    Special Requests   Final    BOTTLES DRAWN AEROBIC AND ANAEROBIC Blood Culture results may not be optimal due to an excessive volume of blood received in culture bottles Performed at [facility], [address] [place], [place] [address]    Culture   Final    NO GROWTH < 12 HOURS Performed at [facility], [address] [place], [place] [address]    Report Status PENDING  Incomplete  Blood Culture (routine x 2)     Status: None (Preliminary result)   Collection Time: 11/25/21  4:35 PM   Specimen: Left Antecubital; Blood  Result Value Ref Range Status   Specimen Description   Final    LEFT ANTECUBITAL BOTTLES DRAWN AEROBIC AND ANAEROBIC   Special Requests Blood Culture adequate volume  Final   Culture   Final    NO GROWTH < 12 HOURS Performed at [facility], [address] [place], [place] [address]    Report Status PENDING  Incomplete   Today   Subjective    Rosine BeatAlan Persley today has no new complaints No fever  Or chills  No further diarrhea -No nausea or vomiting -No chest pains, no palpitations no dizziness, patient ambulated down the hallways with no dyspnea on exertion or other concerns -Plan of care reviewed with patient's wife at bedside, questions answered          Patient has been seen and examined prior to discharge   Objective   Blood pressure (!) 141/78, pulse 87, temperature (!) 97.4 F (36.3 C), temperature source Oral, resp. rate 20, height 6\' 1"  (1.854 m), weight (!) 142.6 kg, SpO2 98 %.   Intake/Output Summary (Last 24 hours) at 11/26/2021 1435 Last data filed at 11/26/2021 1300 Gross per 24 hour  Intake 2756.53 ml  Output 2700 ml  Net 56.53 ml    Exam Gen:- Awake Alert, no acute distress, obese appearing HEENT:- .AT, No sclera  icterus Neck-Supple Neck,No JVD,.  Lungs-  CTAB , good air movement bilaterally CV- S1, S2 normal, regular Abd-  +ve B.Sounds, Abd Soft, No tenderness, increased truncal adiposity    Extremity/Skin:- No  edema,   good pulses Psych-affect is appropriate, oriented x3 Neuro-no new focal deficits, no tremors    Data Review   CBC w Diff:  Lab Results  Component Value Date   WBC 4.3 11/26/2021   HGB 12.3 (L) 11/26/2021    HCT 36.1 (L) 11/26/2021   PLT 60 (L) 11/26/2021   LYMPHOPCT 27 11/25/2021   MONOPCT 7 11/25/2021   EOSPCT 0 11/25/2021   BASOPCT 1 11/25/2021    CMP:  Lab Results  Component Value Date   NA 138 11/26/2021   K 4.0 11/26/2021   CL 107 11/26/2021   CO2 23 11/26/2021   BUN 19 11/26/2021   CREATININE 0.97 11/26/2021   PROT 7.2 11/25/2021   ALBUMIN 3.6 11/25/2021   BILITOT 2.6 (H) 11/25/2021   ALKPHOS 61 11/25/2021   AST 26 11/25/2021   ALT 26 11/25/2021  . Total Discharge time is about 33 minutes  Shon Hale M.D on 11/26/2021 at 2:35 PM  Go to [email or web address] -  for contact info  [facility] - Office  [phone]

## 2021-11-26 NOTE — Progress Notes (Signed)
Pt arrived from ED with wife at bedside. Pt was alert and oriented, able to transfer to bed with minimal assist.  VS taken and documented below. Placed pt on tele and rhythm verified with CCMD. Assessment preformed and admission questions completed.  11/25/21 2035  Vitals  Temp 98 F (36.7 C)  Temp Source Oral  BP 133/73  MAP (mmHg) 91  BP Location Left Arm  BP Method Automatic  Patient Position (if appropriate) Sitting  Pulse Rate 74  Pulse Rate Source Monitor  Resp 20  MEWS COLOR  MEWS Score Color Green  Oxygen Therapy  SpO2 99 %  O2 Device Room Air  Height and Weight  Height 6\' 1"  (1.854 m)  Weight (!) 142.6 kg  Type of Scale Used Bed  Type of Weight Actual  BSA (Calculated - sq m) 2.71 sq meters  BMI (Calculated) 41.49  Weight in (lb) to have BMI = 25 189.1  MEWS Score  MEWS Temp 0  MEWS Systolic 0  MEWS Pulse 0  MEWS RR 0  MEWS LOC 0  MEWS Score 0

## 2021-11-26 NOTE — Progress Notes (Signed)
Pharmacy Antibiotic Note  Darryl Hunt is a 61 y.o. male admitted on 11/25/2021 with sepsis.  Pharmacy has been consulted for Vancomycin and Cefepime dosing. SCr improved from 1.59 >> 0.97  Plan: Cefepime 2gm IV q8h Vancomycin 1500 mg IV Q 12 hrs. Goal AUC 400-550. Expected AUC: 536.2 SCr used: 0.97  Height: 6\' 1"  (185.4 cm) Weight: (!) 142.6 kg (314 lb 6 oz) IBW/kg (Calculated) : 79.9  Temp (24hrs), Avg:97.9 F (36.6 C), Min:97.6 F (36.4 C), Max:98.1 F (36.7 C)  Recent Labs  Lab 11/25/21 1322 11/25/21 1635 11/26/21 0448  WBC 3.1*  --  4.3  CREATININE 1.59*  --  0.97  LATICACIDVEN 2.3* 1.7  --     Estimated Creatinine Clearance: 117.3 mL/min (by C-G formula based on SCr of 0.97 mg/dL).    Allergies  Allergen Reactions   Latex Other (See Comments)    Pt states he is not allergic to latex, and does not want it because his wife is allergic and If he gets it on him she breaks out.    Antimicrobials this admission: Vancomycin 5/20 >>  Cefepime 5/20 >>   Dose adjustments this admission:   Microbiology results:  BCx: pending  UCx: pending   Sputum:    MRSA PCR:   Thank you for allowing pharmacy to be a part of this patient's care.  Hart Robinsons A 11/26/2021 8:09 AM

## 2021-11-27 LAB — URINE CULTURE: Culture: <10000 — AB

## 2021-11-30 LAB — CULTURE, BLOOD (ROUTINE X 2)
Culture: NO GROWTH
Culture: NO GROWTH
Special Requests: ADEQUATE

## 2022-01-02 ENCOUNTER — Other Ambulatory Visit (INDEPENDENT_AMBULATORY_CARE_PROVIDER_SITE_OTHER): Payer: BC Managed Care – PPO

## 2022-01-02 DIAGNOSIS — E1165 Type 2 diabetes mellitus with hyperglycemia: Secondary | ICD-10-CM

## 2022-01-02 DIAGNOSIS — E782 Mixed hyperlipidemia: Secondary | ICD-10-CM | POA: Diagnosis not present

## 2022-01-02 LAB — LIPID PANEL
Cholesterol: 109 mg/dL (ref 0–200)
HDL: 39.5 mg/dL (ref >39.00)
LDL Cholesterol: 54 mg/dL (ref 0–99)
NonHDL: 69.98
Total CHOL/HDL Ratio: 3
Triglycerides: 78 mg/dL (ref 0.0–149.0)
VLDL: 15.6 mg/dL (ref 0.0–40.0)

## 2022-01-02 LAB — BASIC METABOLIC PANEL
BUN: 9 mg/dL (ref 6–23)
CO2: 29 mEq/L (ref 19–32)
Calcium: 9.1 mg/dL (ref 8.4–10.5)
Chloride: 103 mEq/L (ref 96–112)
Creatinine, Ser: 0.74 mg/dL (ref 0.40–1.50)
GFR: 97.17 mL/min (ref >60.00)
Glucose, Bld: 122 mg/dL — ABNORMAL HIGH (ref 70–99)
Potassium: 4 mEq/L (ref 3.5–5.1)
Sodium: 138 mEq/L (ref 135–145)

## 2022-01-02 LAB — HEMOGLOBIN A1C: Hgb A1c MFr Bld: 6.4 % (ref 4.6–6.5)

## 2022-01-05 ENCOUNTER — Other Ambulatory Visit: Payer: Self-pay

## 2022-01-05 ENCOUNTER — Encounter: Payer: Self-pay | Admitting: Endocrinology

## 2022-01-05 ENCOUNTER — Ambulatory Visit: Payer: BC Managed Care – PPO | Admitting: Endocrinology

## 2022-01-05 VITALS — BP 120/64 | HR 73 | Ht 73.0 in | Wt 315.0 lb

## 2022-01-05 DIAGNOSIS — E782 Mixed hyperlipidemia: Secondary | ICD-10-CM | POA: Diagnosis not present

## 2022-01-05 DIAGNOSIS — E1165 Type 2 diabetes mellitus with hyperglycemia: Secondary | ICD-10-CM

## 2022-01-05 MED ORDER — FREESTYLE LIBRE 3 SENSOR MISC: 2 refills | Status: DC

## 2022-01-05 MED ORDER — REPAGLINIDE 2 MG PO TABS: 2.0000 mg | ORAL_TABLET | Freq: Three times a day (TID) | ORAL | 3 refills | Status: DC

## 2022-01-05 NOTE — Progress Notes (Unsigned)
Patient ID: Darryl Hunt, male   DOB: [birth year], 61 y.o.   MRN: [id number]    Reason for Appointment: follow-up   History of Present Illness   Diagnosis: Type 2 DIABETES MELITUS, date of diagnosis: 2010        PREVIOUS history: He has been treated previously with regimen of metformin, glipizide and subsequently Victoza Overall has had fairly good control and glipizide was stopped because of tendency to hypoglycemia His A1c in the past has ranged from 6.7-7%, was 7% in 4/14 He was started on Invokana in 8/14; with this he started losing weight and blood sugars were consistently improved Because of recurrent balanitis he stopped this on his visit in 8/16  RECENT history:   He is on a regimen of   Amaryl 2 mg bid and metformin 2 g  a day. Ozempic 1.0 mg weekly  Fructosamine is 307  Current management, blood sugar patterns and problems identified: He has been taking the 1 mg Ozempic since January when this was increased  Also previously because of stress and other reasons he had not watched his diet and gained weight and his blood sugars are generally higher  Although his blood sugars appear to be significantly better he is only able to lose 3 pounds even with the higher dose of Ozempic  Currently using libre 3 which however appears to be reading about 30-35 mg lower than the fingerstick in the office  Blood sugars not appear to be fairly good overnight but variably higher after meals especially after breakfast and lunch  Some walking  Checking blood sugars somewhat erratically not every day His blood sugar patterns from the CGM show periodically high readings, mostly in the evenings after dinnertime and occasionally morning also; occasionally fasting readings may be close to target Highest average blood sugar 187 after 10 p.m.   Side effects from medications: balanitis from Invokana.  Vomiting from Victoza 1.8 mg  Data from freestyle libre 3 on the last 2  weeks:  CGM use % of time   2-week average/GV 172  Time in range 63       %  % Time Above 180   % Time above 250   % Time Below 70      PRE-MEAL Fasting Lunch Dinner Bedtime Overall  Glucose range:       Averages:        POST-MEAL PC Breakfast PC Lunch PC Dinner  Glucose range:     Averages:         CGM use % of time 76  2-week average/GV 138/23  Time in range      89  %  % Time Above 180 11  % Time above 250   % Time Below 70      PRE-MEAL Fasting Lunch Dinner Bedtime Overall  Glucose range:       Averages: 120       POST-MEAL PC Breakfast PC Lunch PC Dinner  Glucose range:     Averages: 144 157 140       Wt Readings from Last 3 Encounters:  01/05/22 (!) 315 lb (142.9 kg)  11/25/21 (!) 314 lb 6 oz (142.6 kg)  10/05/21 (!) 317 lb 6.4 oz (144 kg)   Lab Results  Component Value Date   HGBA1C 6.4 01/02/2022   HGBA1C 8.0 (H) 07/13/2021   HGBA1C 7.2 (H) 04/20/2021   Lab Results  Component Value Date   MICROALBUR 5.7 (H) 09/15/2021  LDLCALC 54 01/02/2022   CREATININE 0.74 01/02/2022   Lab Results  Component Value Date   HGB 12.3 (L) 11/26/2021     OTHER active problems: See review of systems   Lab on 01/02/2022  Component Date Value Ref Range Status   Cholesterol 01/02/2022 109  0 - 200 mg/dL Final   ATP III Classification       Desirable:  < 200 mg/dL               Borderline High:  200 - 239 mg/dL          High:  > = 295 mg/dL   Triglycerides 18/84/1660 78.0  0.0 - 149.0 mg/dL Final   Normal:  <630 mg/dLBorderline High:  150 - 199 mg/dL   HDL 16/07/930 35.57  >39.00 mg/dL Final   VLDL 32/20/2542 15.6  0.0 - 40.0 mg/dL Final   LDL Cholesterol 01/02/2022 54  0 - 99 mg/dL Final   Total CHOL/HDL Ratio 01/02/2022 3   Final                  Men          Women1/2 Average Risk     3.4          3.3Average Risk          5.0          4.42X Average Risk          9.6          7.13X Average Risk          15.0          11.0                       NonHDL  01/02/2022 69.98   Final   NOTE:  Non-HDL goal should be 30 mg/dL higher than patient's LDL goal (i.e. LDL goal of < 70 mg/dL, would have non-HDL goal of < 100 mg/dL)   Sodium 70/62/3762 831  135 - 145 mEq/L Final   Potassium 01/02/2022 4.0  3.5 - 5.1 mEq/L Final   Chloride 01/02/2022 103  96 - 112 mEq/L Final   CO2 01/02/2022 29  19 - 32 mEq/L Final   Glucose, Bld 01/02/2022 122 (H)  70 - 99 mg/dL Final   BUN 51/76/1607 9  6 - 23 mg/dL Final   Creatinine, Ser 01/02/2022 0.74  0.40 - 1.50 mg/dL Final   GFR 37/04/6268 97.17  >60.00 mL/min Final   Calculated using the CKD-EPI Creatinine Equation (2021)   Calcium 01/02/2022 9.1  8.4 - 10.5 mg/dL Final   Hgb S8N MFr Bld 01/02/2022 6.4  4.6 - 6.5 % Final   Glycemic Control Guidelines for People with Diabetes:Non Diabetic:  <6%Goal of Therapy: <7%Additional Action Suggested:  >8%     Allergies as of 01/05/2022       Reactions   Latex Other (See Comments)   Pt states he is not allergic to latex, and does not want it because his wife is allergic and If he gets it on him she breaks out.        Medication List        Accurate as of January 05, 2022  9:07 AM. If you have any questions, ask your nurse or doctor.          albuterol 108 (90 Base) MCG/ACT inhaler Commonly known as: VENTOLIN HFA Inhale 2 puffs into the lungs every 4 (four) hours  as needed.   Fish Oil Ultra 1400 MG Caps Take 2,800 mg by mouth 2 (two) times daily. Take 2 capsules by mouth twice daily.   FreeStyle Libre 2 [employer] 1 each by Other route every 14 (fourteen) days.   FreeStyle Libre 3 Sensor Misc APPLY 1 SENSOR ON UPPER ARM EVERY 14 DAYS FOR CONTINUOUS GLUCOSE MONITORING   glimepiride 4 MG tablet Commonly known as: AMARYL TAKE 1/2 TABLET BY MOUTH TWICE A DAY   Insulin Pen Needle 32G X 4 MM Misc Use 1 needle daily.   levocetirizine 5 MG tablet Commonly known as: XYZAL Take 5 mg by mouth every evening.   lisinopril 10 MG tablet Commonly known as:  ZESTRIL Take 1 tablet (10 mg total) by mouth daily. Hold if SBP < 110 mmhg   meclizine 25 MG tablet Commonly known as: ANTIVERT Take 25 mg by mouth 4 (four) times daily.   metFORMIN 500 MG 24 hr tablet Commonly known as: GLUCOPHAGE-XR TAKE 4 TABLETS BY MOUTH  DAILY WITH SUPPER   mometasone 50 MCG/ACT nasal spray Commonly known as: NASONEX Place 2 sprays into the nose daily as needed (allergies).   omeprazole 20 MG capsule Commonly known as: PRILOSEC Take 20 mg by mouth daily.   ondansetron 4 MG disintegrating tablet Commonly known as: ZOFRAN-ODT Take 4 mg by mouth every 8 (eight) hours as needed for nausea or vomiting.   Ozempic (1 MG/DOSE) 4 MG/3ML Sopn Generic drug: Semaglutide (1 MG/DOSE) INJECT 1MG  INTO THE SKIN ONCE A WEEK What changed: See the new instructions.   pantoprazole 40 MG tablet Commonly known as: PROTONIX Take 40 mg by mouth daily.   propranolol 20 MG tablet Commonly known as: INDERAL Take 1 tablet (20 mg total) by mouth 2 (two) times daily. Hold if SBP < 110 mmhg or if HR < 60 bpm   rosuvastatin 5 MG tablet Commonly known as: CRESTOR TAKE 1 TABLET BY MOUTH  DAILY   tadalafil 5 MG tablet Commonly known as: CIALIS Take 5 mg by mouth daily.   tamsulosin 0.4 MG Caps capsule Commonly known as: FLOMAX Take 1 capsule (0.4 mg total) by mouth daily after supper.   tiZANidine 4 MG tablet Commonly known as: ZANAFLEX Take 4 mg by mouth every 8 (eight) hours as needed.   traMADol 50 MG tablet Commonly known as: ULTRAM Take 50 mg by mouth 3 (three) times daily as needed.   Vitamin D 125 MCG (5000 UT) Caps Take 5,000 Units by mouth 3 (three) times daily.        Allergies:  Allergies  Allergen Reactions   Latex Other (See Comments)    Pt states he is not allergic to latex, and does not want it because his wife is allergic and If he gets it on him she breaks out.    Past Medical History:  Diagnosis Date   Arthritis    Back   Diabetes mellitus  without complication (HCC)    Type II   Fatty liver    GERD (gastroesophageal reflux disease)    History of kidney stones    Hyperlipidemia    Hypertension    OSA on CPAP    Pinched nerve in neck    Seasonal allergies    Varicose vein of leg    bilateral   Wears glasses    Wears hearing aid in both ears     Past Surgical History:  Procedure Laterality Date   CHOLECYSTECTOMY     COLONOSCOPY  CYSTOSCOPY WITH RETROGRADE PYELOGRAM, URETEROSCOPY AND STENT PLACEMENT Left 03/19/2019   Procedure: CYSTOSCOPY WITH LEFT  RETROGRADE URETEROSCOPY WITH HOLMIUM LASER ANDPOSSIBLE STENT STENT PLACEMENT;  Surgeon: Bjorn Pippin, MD;  Location: [facility];  Service: Urology;  Laterality: Left;   FRACTURE SURGERY Right    Arm childhood x2   KNEE ARTHROSCOPY Left    NASAL SINUS SURGERY     SHOULDER ARTHROSCOPY Right    TONSILLECTOMY AND ADENOIDECTOMY     Childhood   TRIGGER FINGER RELEASE Left     Family History  Problem Relation Age of Onset   Cancer Mother    Hypertension Mother    Cancer Father    Hypertension Father    Hypertension Brother    Diabetes Brother    Diabetes Maternal Grandmother     Social History:  reports that he has never smoked. He has never used smokeless tobacco. He reports that he does not drink alcohol and does not use drugs.  Review of Systems:   HYPERTENSION: Blood pressure has been well controlled  BP Readings from Last 3 Encounters:  01/05/22 120/64  11/26/21 (!) 141/78  10/05/21 118/78    HYPERLIPIDEMIA:   Has consistent control with mg as follows   Lab Results  Component Value Date   CHOL 109 01/02/2022   HDL 39.50 01/02/2022   LDLCALC 54 01/02/2022   LDLDIRECT 91.1 05/27/2014   TRIG 78.0 01/02/2022   CHOLHDL 3 01/02/2022     He has cirrhosis of the liver with varices, followed by gastroenterologist and is being evaluated for possible transplant  Lab Results  Component Value Date   ALT 26 11/25/2021    Diabetic  foot exam in 10/22    Examination:   BP 120/64   Pulse 73   Ht 6\' 1"  (1.854 m)   Wt (!) 315 lb (142.9 kg)   SpO2 98%   BMI 41.56 kg/m   Body mass index is 41.56 kg/m.      ASSESSMENT/ PLAN:  Diabetes type 2 with morbid obesity  See history of present illness for detailed discussion of current diabetes management, blood sugar patterns and problems identified   A1c is 6.4 compared to 8% but this appears to be falsely low  His GMI on his sensor recently is 7.4 Previously his sensor was reading falsely low  He is on Ozempic 1 mg weekly, Metformin and glimepiride 2 mg twice daily Although he thinks he is cutting back on portions and generally watching his diet his weight has not come down   Unclear why he is having significant postprandial hyperglycemia at times and also periodically high fasting readings  Plan: We will try him on Prandin to help with postprandial hyperglycemia instead of Amaryl and see if this helps Discussed how this works, timing of taking the medication and possible need for dosage adjustment Focus on his diet better and try to increase walking Also may consider switching to [facility] but this is not covered If his blood sugars are not well controlled we will need to consider mealtime insulin and/or increase Ozempic  There are no Patient Instructions on file for this visit.   [facility] 01/05/2022, 9:07 AM   Note: This office note was prepared with Dragon voice recognition system technology. Any transcriptional errors that result from this process are unintentional.

## 2022-01-05 NOTE — Patient Instructions (Addendum)
Stop Glimeperide and try Prandin before meals  Avoid hi carb meals and sweets  More walking

## 2022-01-12 ENCOUNTER — Encounter: Payer: Self-pay | Admitting: Endocrinology

## 2022-01-26 ENCOUNTER — Other Ambulatory Visit: Payer: Self-pay | Admitting: Orthopedic Surgery

## 2022-01-27 ENCOUNTER — Other Ambulatory Visit: Payer: Self-pay | Admitting: Endocrinology

## 2022-02-12 ENCOUNTER — Encounter (HOSPITAL_BASED_OUTPATIENT_CLINIC_OR_DEPARTMENT_OTHER): Payer: Self-pay | Admitting: Orthopedic Surgery

## 2022-02-16 ENCOUNTER — Encounter (HOSPITAL_BASED_OUTPATIENT_CLINIC_OR_DEPARTMENT_OTHER)
Admission: RE | Disposition: A | Discharge status: Home / Self Care | Payer: Self-pay | Source: Home / Self Care | Attending: Orthopedic Surgery

## 2022-02-16 ENCOUNTER — Ambulatory Visit (HOSPITAL_BASED_OUTPATIENT_CLINIC_OR_DEPARTMENT_OTHER)
Admission: RE | Admit: 2022-02-16 | Discharge: 2022-02-16 | Disposition: A | Discharge status: Home / Self Care | Payer: BC Managed Care – PPO | Attending: Orthopedic Surgery | Admitting: Orthopedic Surgery

## 2022-02-16 ENCOUNTER — Other Ambulatory Visit: Payer: Self-pay

## 2022-02-16 ENCOUNTER — Ambulatory Visit (HOSPITAL_BASED_OUTPATIENT_CLINIC_OR_DEPARTMENT_OTHER): Payer: BC Managed Care – PPO | Admitting: Certified Registered"

## 2022-02-16 ENCOUNTER — Encounter (HOSPITAL_BASED_OUTPATIENT_CLINIC_OR_DEPARTMENT_OTHER): Payer: Self-pay | Admitting: Orthopedic Surgery

## 2022-02-16 DIAGNOSIS — Z79899 Other long term (current) drug therapy: Secondary | ICD-10-CM | POA: Diagnosis not present

## 2022-02-16 DIAGNOSIS — K219 Gastro-esophageal reflux disease without esophagitis: Secondary | ICD-10-CM | POA: Diagnosis not present

## 2022-02-16 DIAGNOSIS — E119 Type 2 diabetes mellitus without complications: Secondary | ICD-10-CM | POA: Insufficient documentation

## 2022-02-16 DIAGNOSIS — G4733 Obstructive sleep apnea (adult) (pediatric): Secondary | ICD-10-CM | POA: Diagnosis not present

## 2022-02-16 DIAGNOSIS — M65311 Trigger thumb, right thumb: Secondary | ICD-10-CM | POA: Insufficient documentation

## 2022-02-16 DIAGNOSIS — Z6841 Body Mass Index (BMI) 40.0 and over, adult: Secondary | ICD-10-CM | POA: Diagnosis not present

## 2022-02-16 DIAGNOSIS — I10 Essential (primary) hypertension: Secondary | ICD-10-CM | POA: Insufficient documentation

## 2022-02-16 DIAGNOSIS — Z7985 Long-term (current) use of injectable non-insulin antidiabetic drugs: Secondary | ICD-10-CM | POA: Diagnosis not present

## 2022-02-16 DIAGNOSIS — Z7984 Long term (current) use of oral hypoglycemic drugs: Secondary | ICD-10-CM | POA: Insufficient documentation

## 2022-02-16 HISTORY — PX: TRIGGER FINGER RELEASE: SHX641

## 2022-02-16 LAB — GLUCOSE, CAPILLARY
Glucose-Capillary: 116 mg/dL — ABNORMAL HIGH (ref 70–99)
Glucose-Capillary: 105 mg/dL — ABNORMAL HIGH (ref 70–99)

## 2022-02-16 SURGERY — RELEASE, A1 PULLEY, FOR TRIGGER FINGER
Anesthesia: Regional | Laterality: Right

## 2022-02-16 MED ORDER — LACTATED RINGERS IV SOLN: INTRAVENOUS | Status: DC

## 2022-02-16 MED ORDER — FENTANYL CITRATE (PF) 100 MCG/2ML IJ SOLN
INTRAMUSCULAR | Status: AC
  Filled 2022-02-16: qty 2

## 2022-02-16 MED ORDER — MIDAZOLAM HCL 5 MG/5ML IJ SOLN
INTRAMUSCULAR | Status: DC | PRN
  Administered 2022-02-16: 1 mg via INTRAVENOUS

## 2022-02-16 MED ORDER — PROPOFOL 10 MG/ML IV BOLUS
INTRAVENOUS | Status: AC
  Filled 2022-02-16: qty 20

## 2022-02-16 MED ORDER — TRAMADOL HCL 50 MG PO TABS: ORAL_TABLET | ORAL | 0 refills | Status: DC

## 2022-02-16 MED ORDER — CEFAZOLIN SODIUM-DEXTROSE 2-4 GM/100ML-% IV SOLN
2.0000 g | INTRAVENOUS | Status: AC
  Administered 2022-02-16: 3 g via INTRAVENOUS

## 2022-02-16 MED ORDER — CEFAZOLIN SODIUM-DEXTROSE 2-4 GM/100ML-% IV SOLN
INTRAVENOUS | Status: AC
Start: 2022-02-16 — End: ?
  Filled 2022-02-16: qty 100

## 2022-02-16 MED ORDER — BUPIVACAINE HCL (PF) 0.25 % IJ SOLN
INTRAMUSCULAR | Status: DC | PRN
  Administered 2022-02-16: 9 mL

## 2022-02-16 MED ORDER — BUPIVACAINE-EPINEPHRINE (PF) 0.25% -1:200000 IJ SOLN
INTRAMUSCULAR | Status: AC
Start: 2022-02-16 — End: ?
  Filled 2022-02-16: qty 60

## 2022-02-16 MED ORDER — BUPIVACAINE HCL (PF) 0.25 % IJ SOLN
INTRAMUSCULAR | Status: AC
Start: 2022-02-16 — End: ?
  Filled 2022-02-16: qty 60

## 2022-02-16 MED ORDER — LIDOCAINE HCL (PF) 0.5 % IJ SOLN
INTRAMUSCULAR | Status: DC | PRN
  Administered 2022-02-16: 30 mL via INTRAVENOUS

## 2022-02-16 MED ORDER — FENTANYL CITRATE (PF) 100 MCG/2ML IJ SOLN
INTRAMUSCULAR | Status: DC | PRN
Start: 2022-02-16 — End: 2022-02-16
  Administered 2022-02-16: 50 ug via INTRAVENOUS

## 2022-02-16 MED ORDER — MIDAZOLAM HCL 2 MG/2ML IJ SOLN
INTRAMUSCULAR | Status: AC
  Filled 2022-02-16: qty 2

## 2022-02-16 SURGICAL SUPPLY — 33 items
APL PRP STRL LF DISP 70% ISPRP (MISCELLANEOUS) ×1
BLADE SURG 15 STRL LF DISP TIS (BLADE) ×2 IMPLANT
BLADE SURG 15 STRL SS (BLADE) ×4
BNDG CMPR 5X2 CHSV 1 LYR STRL (GAUZE/BANDAGES/DRESSINGS) ×1
BNDG CMPR 9X4 STRL LF SNTH (GAUZE/BANDAGES/DRESSINGS)
BNDG COHESIVE 2X5 TAN ST LF (GAUZE/BANDAGES/DRESSINGS) ×2 IMPLANT
BNDG ESMARK 4X9 LF (GAUZE/BANDAGES/DRESSINGS) IMPLANT
CHLORAPREP W/TINT 26 (MISCELLANEOUS) ×2 IMPLANT
CORD BIPOLAR FORCEPS 12FT (ELECTRODE) ×2 IMPLANT
COVER BACK TABLE 60X90IN (DRAPES) ×2 IMPLANT
COVER MAYO STAND STRL (DRAPES) ×2 IMPLANT
CUFF TOURN SGL QUICK 18X4 (TOURNIQUET CUFF) ×2 IMPLANT
DRAPE EXTREMITY T 121X128X90 (DISPOSABLE) ×2 IMPLANT
DRAPE SURG 17X23 STRL (DRAPES) ×2 IMPLANT
GAUZE SPONGE 4X4 12PLY STRL (GAUZE/BANDAGES/DRESSINGS) ×2 IMPLANT
GAUZE XEROFORM 1X8 LF (GAUZE/BANDAGES/DRESSINGS) ×2 IMPLANT
GLOVE BIO SURGEON STRL SZ7.5 (GLOVE) ×2 IMPLANT
GLOVE BIOGEL PI IND STRL 8 (GLOVE) ×1 IMPLANT
GLOVE BIOGEL PI INDICATOR 8 (GLOVE) ×1
GOWN STRL REUS W/ TWL LRG LVL3 (GOWN DISPOSABLE) ×1 IMPLANT
GOWN STRL REUS W/TWL LRG LVL3 (GOWN DISPOSABLE) ×2
GOWN STRL REUS W/TWL XL LVL3 (GOWN DISPOSABLE) ×2 IMPLANT
MAT PREVALON FULL STRYKER (MISCELLANEOUS) ×1 IMPLANT
NDL HYPO 25X1 1.5 SAFETY (NEEDLE) ×1 IMPLANT
NEEDLE HYPO 25X1 1.5 SAFETY (NEEDLE) ×2 IMPLANT
NS IRRIG 1000ML POUR BTL (IV SOLUTION) ×2 IMPLANT
PACK BASIN DAY SURGERY FS (CUSTOM PROCEDURE TRAY) ×2 IMPLANT
STOCKINETTE 4X48 STRL (DRAPES) ×2 IMPLANT
SUT ETHILON 4 0 PS 2 18 (SUTURE) ×2 IMPLANT
SYR BULB EAR ULCER 3OZ GRN STR (SYRINGE) ×2 IMPLANT
SYR CONTROL 10ML LL (SYRINGE) ×2 IMPLANT
TOWEL GREEN STERILE FF (TOWEL DISPOSABLE) ×4 IMPLANT
UNDERPAD 30X36 HEAVY ABSORB (UNDERPADS AND DIAPERS) ×2 IMPLANT

## 2022-02-16 NOTE — Transfer of Care (Signed)
Immediate Anesthesia Transfer of Care Note  Patient: Darryl Hunt  Procedure(s) Performed: RELEASE TRIGGER FINGER/A-1 PULLEY RIGHT THUMB (Right)  Patient Location: PACU  Anesthesia Type:MAC and Bier block  Level of Consciousness: awake, alert  and oriented  Airway & Oxygen Therapy: Patient Spontanous Breathing  Post-op Assessment: Report given to RN and Post -op Vital signs reviewed and stable  Post vital signs: Reviewed and stable  Last Vitals:  Vitals Value Taken Time  BP 156/91 02/16/22 1345  Temp    Pulse 65 02/16/22 1346  Resp    SpO2 98 % 02/16/22 1346  Vitals shown include unvalidated device data.  Last Pain:  Vitals:   02/16/22 1130  TempSrc: Oral  PainSc: 2       Patients Stated Pain Goal: 7 (76/39/43 2003)  Complications: No notable events documented.

## 2022-02-16 NOTE — Anesthesia Preprocedure Evaluation (Addendum)
Anesthesia Evaluation  Patient identified by MRN, date of birth, ID band Patient awake    Reviewed: Allergy & Precautions, NPO status , Patient's Chart, lab work & pertinent test results, reviewed documented beta blocker date and time   Airway Mallampati: III  TM Distance: >3 FB     Dental  (+) Dental Advisory Given   Pulmonary sleep apnea ,    breath sounds clear to auscultation       Cardiovascular hypertension, Pt. on home beta blockers and Pt. on medications  Rhythm:Regular Rate:Normal     Neuro/Psych    GI/Hepatic GERD  ,  Endo/Other  diabetesMorbid obesity  Renal/GU Renal disease     Musculoskeletal  (+) Arthritis ,   Abdominal (+) + obese,   Peds  Hematology   Anesthesia Other Findings   Reproductive/Obstetrics                            Anesthesia Physical  Anesthesia Plan  ASA: 3  Anesthesia Plan: Bier Block and Bier Block-LIDOCAINE ONLY   Post-op Pain Management:    Induction: Intravenous  PONV Risk Score and Plan: 2 and Ondansetron, Dexamethasone and Midazolam  Airway Management Planned: Natural Airway  Additional Equipment:   Intra-op Plan:   Post-operative Plan:   Informed Consent: I have reviewed the patients History and Physical, chart, labs and discussed the procedure including the risks, benefits and alternatives for the proposed anesthesia with the patient or authorized representative who has indicated his/her understanding and acceptance.     Dental advisory given  Plan Discussed with: CRNA  Anesthesia Plan Comments:        Anesthesia Quick Evaluation

## 2022-02-16 NOTE — Discharge Instructions (Addendum)

## 2022-02-16 NOTE — H&P (Signed)
Darryl Hunt is an 61 y.o. male.   Chief Complaint: trigger thumb HPI: 61 yo male with triggering right thumb.  This is bothersome to him.  He wishes to forego injection and have surgical release.  Allergies:  Allergies  Allergen Reactions   Latex Other (See Comments)    Pt states he is not allergic to latex, and does not want it because his wife is allergic and If he gets it on him she breaks out.    Past Medical History:  Diagnosis Date   Arthritis    Back   Diabetes mellitus without complication (HCC)    Type II   Fatty liver    GERD (gastroesophageal reflux disease)    History of kidney stones    Hyperlipidemia    Hypertension    OSA on CPAP    Pinched nerve in neck    Seasonal allergies    Varicose vein of leg    bilateral   Wears glasses    Wears hearing aid in both ears     Past Surgical History:  Procedure Laterality Date   CHOLECYSTECTOMY     COLONOSCOPY     CYSTOSCOPY WITH RETROGRADE PYELOGRAM, URETEROSCOPY AND STENT PLACEMENT Left 03/19/2019   Procedure: CYSTOSCOPY WITH LEFT  RETROGRADE URETEROSCOPY WITH HOLMIUM LASER ANDPOSSIBLE STENT STENT PLACEMENT;  Surgeon: Bjorn Pippin, MD;  Location: [facility] ;  Service: Urology;  Laterality: Left;   FRACTURE SURGERY Right    Arm childhood x2   KNEE ARTHROSCOPY Left    NASAL SINUS SURGERY     SHOULDER ARTHROSCOPY Right    TONSILLECTOMY AND ADENOIDECTOMY     Childhood   TRIGGER FINGER RELEASE Left     Family History: Family History  Problem Relation Age of Onset   Cancer Mother    Hypertension Mother    Cancer Father    Hypertension Father    Hypertension Brother    Diabetes Brother    Diabetes Maternal Grandmother     Social History:   reports that he has never smoked. He has never used smokeless tobacco. He reports that he does not drink alcohol and does not use drugs.  Medications: Medications Prior to Admission  Medication Sig Dispense Refill   Cholecalciferol (VITAMIN D) 125 MCG  (5000 UT) CAPS Take 5,000 Units by mouth 3 (three) times daily.     Continuous Blood Gluc Receiver (FREESTYLE LIBRE 2 READER) DEVI 1 each by Other route every 14 (fourteen) days. 2 each 2   Continuous Blood Gluc Sensor (FREESTYLE LIBRE 3 SENSOR) MISC APPLY 1 SENSOR ON UPPER ARM EVERY 14 DAYS FOR CONTINUOUS GLUCOSE MONITORING 2 each 2   Insulin Pen Needle 32G X 4 MM MISC Use 1 needle daily. 100 each 2   levocetirizine (XYZAL) 5 MG tablet Take 5 mg by mouth every evening.      meclizine (ANTIVERT) 25 MG tablet Take 25 mg by mouth 4 (four) times daily.     metFORMIN (GLUCOPHAGE-XR) 500 MG 24 hr tablet TAKE 4 TABLETS BY MOUTH  DAILY WITH SUPPER 360 tablet 3   mometasone (NASONEX) 50 MCG/ACT nasal spray Place 2 sprays into the nose daily as needed (allergies).   1   Omega-3 Fatty Acids (FISH OIL ULTRA) 1400 MG CAPS Take 2,800 mg by mouth 2 (two) times daily. Take 2 capsules by mouth twice daily.     omeprazole (PRILOSEC) 20 MG capsule Take 20 mg by mouth daily.     OZEMPIC, 1 MG/DOSE, 4 MG/3ML  SOPN INJECT 1 MG INTO THE SKIN ONE TIME PER WEEK 3 mL 2   propranolol (INDERAL) 20 MG tablet Take 1 tablet (20 mg total) by mouth 2 (two) times daily. Hold if SBP < 110 mmhg or if HR < 60 bpm 60 tablet 0   repaglinide (PRANDIN) 2 MG tablet Take 1 tablet (2 mg total) by mouth 3 (three) times daily before meals. 90 tablet 3   rosuvastatin (CRESTOR) 5 MG tablet TAKE 1 TABLET BY MOUTH  DAILY 90 tablet 3   tadalafil (CIALIS) 5 MG tablet Take 5 mg by mouth daily.     tamsulosin (FLOMAX) 0.4 MG CAPS capsule Take 1 capsule (0.4 mg total) by mouth daily after supper. 30 capsule 0   albuterol (VENTOLIN HFA) 108 (90 Base) MCG/ACT inhaler Inhale 2 puffs into the lungs every 4 (four) hours as needed.     ondansetron (ZOFRAN-ODT) 4 MG disintegrating tablet Take 4 mg by mouth every 8 (eight) hours as needed for nausea or vomiting.      Results for orders placed or performed during the hospital encounter of 02/16/22 (from the  past 48 hour(s))  Glucose, capillary     Status: Abnormal   Collection Time: 02/16/22 11:33 AM  Result Value Ref Range   Glucose-Capillary 116 (H) 70 - 99 mg/dL    Comment: Glucose reference range applies only to samples taken after fasting for at least 8 hours.    No results found.    Blood pressure 133/73, pulse 69, temperature 97.8 F (36.6 C), temperature source Oral, resp. rate 13, height 6\' 1"  (1.854 m), weight (!) 142 kg, SpO2 100 %.  General appearance: alert, cooperative, and appears stated age Head: Normocephalic, without obvious abnormality, atraumatic Neck: supple, symmetrical, trachea midline Extremities: Intact sensation and capillary refill all digits.  +epl/fpl/io.  No wounds.  Pulses: 2+ and symmetric Skin: Skin color, texture, turgor normal. No rashes or lesions Neurologic: Grossly normal Incision/Wound: none  Assessment/Plan Right thumb trigger digit.  Non operative and operative treatment options have been discussed with the patient and patient wishes to proceed with operative treatment. Risks, benefits, and alternatives of surgery have been discussed and the patient agrees with the plan of care.   02/16/2022, 1:05 PM

## 2022-02-16 NOTE — Op Note (Addendum)
02/16/2022 [facility] OPERATIVE REPORT   PREOPERATIVE DIAGNOSIS: Right trigger thumb.  POSTOPERATIVE DIAGNOSIS:  Right trigger thumb.  PROCEDURE: Right trigger thumb release.  SURGEON:  Betha Loa, MD  ASSISTANT:  None.  ANESTHESIA:  Bier block.  IV FLUIDS:  Per anesthesia flow sheet.  ESTIMATED BLOOD LOSS:  Minimal.  COMPLICATIONS:  None.  SPECIMENS:  None.  TOURNIQUET TIME:  Total Tourniquet Time Documented: Forearm (Right) - 22 minutes Total: Forearm (Right) - 22 minutes   DISPOSITION:  Stable to PACU.  LOCATION: [facility]  INDICATIONS: Darryl Hunt is a 61 y.o. male with triggering right thumb.  He wishes to have surgical release.  Risks, benefits and alternatives of surgery were discussed including the risk of blood loss, infection, damage to nerves, vessels, tendons, ligaments, bone, failure of surgery, need for additional surgery, complications with wound healing, continued pain, continued triggering and need for repeat surgery.  He voiced understanding of these risks and elected to proceed.  OPERATIVE COURSE:  After being identified preoperatively by myself, the patient and I agreed upon the procedure and site of procedure.  The surgical site was marked. Surgical consent had been signed. He was given IV Ancef as preoperative antibiotic prophylaxis. He was transported to the operating room and placed on the operating room table in supine position with the Right upper extremity on an arm board. Bier block anesthesia was induced by the anesthesiologist.  The Right upper extremity was prepped and draped in normal sterile orthopedic fashion. A surgical pause was performed between surgeons, anesthesia, and operating room staff, and all were in agreement as to the patient, procedure, and site of procedure.  Tourniquet at the proximal aspect of the forearm had been inflated for the Bier block.  An incision was made transversely at the MP  flexion crease of the thumb.  This was made through the skin only.  Spreading technique was used.  The radial and ulnar digital nerves were identified and were protected throughout the case. The flexor sheath was identified.  The A1 pulley was identified.  It was sharply incised.  It was released in its entirety.  Care was taken to avoid any release of the oblique pulley. The thumb was placed through a range of motion, there was noted to be no catching.  The wound was copiously irrigated with sterile saline. It was then closed with 4-0 nylon in a horizontal mattress fashion.  The wound was injected with  0.25% plain Marcaine to aid in postoperative analgesia.  It was then dressed with sterile Xeroform, 4x4s, and wrapped lightly with a Coban dressing.  Tourniquet was deflated at 22 minutes.  The fingertips were pink with brisk capillary refill after deflation of the tourniquet.  The operative drapes were broken down and the patient was awoken from anesthesia safely.  He was transferred back to the stretcher and taken to the PACU in stable condition.   I will see him back in the office in 1 week for postoperative followup.  I will give him a prescription for Tramadol 50 mg 1-2 tabs PO q6 hours prn pain, dispense # 15.    Betha Loa, MD Electronically signed, 02/16/22

## 2022-02-16 NOTE — Anesthesia Postprocedure Evaluation (Signed)
Anesthesia Post Note  Patient: Darryl Hunt  Procedure(s) Performed: RELEASE TRIGGER FINGER/A-1 PULLEY RIGHT THUMB (Right)     Patient location during evaluation: PACU Anesthesia Type: Bier Block Level of consciousness: awake and alert Pain management: pain level controlled Vital Signs Assessment: post-procedure vital signs reviewed and stable Respiratory status: spontaneous breathing Cardiovascular status: stable Anesthetic complications: no   No notable events documented.  Last Vitals:  Vitals:   02/16/22 1345 02/16/22 1400  BP: (!) 156/91 (!) 158/95  Pulse: 63 66  Resp: 18 17  Temp: 36.4 C   SpO2: 97% 98%    Last Pain:  Vitals:   02/16/22 1400  TempSrc:   PainSc: 0-No pain                 Lewie Loron

## 2022-02-19 ENCOUNTER — Encounter (HOSPITAL_BASED_OUTPATIENT_CLINIC_OR_DEPARTMENT_OTHER): Payer: Self-pay | Admitting: Orthopedic Surgery

## 2022-02-19 ENCOUNTER — Encounter: Payer: Self-pay | Admitting: Endocrinology

## 2022-02-19 DIAGNOSIS — E782 Mixed hyperlipidemia: Secondary | ICD-10-CM

## 2022-02-19 DIAGNOSIS — E1165 Type 2 diabetes mellitus with hyperglycemia: Secondary | ICD-10-CM

## 2022-02-19 MED ORDER — ROSUVASTATIN CALCIUM 5 MG PO TABS: 5.0000 mg | ORAL_TABLET | Freq: Every day | ORAL | 3 refills | Status: AC

## 2022-02-19 MED ORDER — METFORMIN HCL ER 500 MG PO TB24: ORAL_TABLET | ORAL | 3 refills | Status: AC

## 2022-03-06 ENCOUNTER — Other Ambulatory Visit: Payer: Self-pay | Admitting: Endocrinology

## 2022-04-10 ENCOUNTER — Other Ambulatory Visit (INDEPENDENT_AMBULATORY_CARE_PROVIDER_SITE_OTHER): Payer: BC Managed Care – PPO

## 2022-04-10 DIAGNOSIS — E1165 Type 2 diabetes mellitus with hyperglycemia: Secondary | ICD-10-CM | POA: Diagnosis not present

## 2022-04-10 LAB — BASIC METABOLIC PANEL
BUN: 8 mg/dL (ref 6–23)
CO2: 27 mEq/L (ref 19–32)
Calcium: 8.8 mg/dL (ref 8.4–10.5)
Chloride: 105 mEq/L (ref 96–112)
Creatinine, Ser: 0.68 mg/dL (ref 0.40–1.50)
GFR: 99.49 mL/min (ref >60.00)
Glucose, Bld: 105 mg/dL — ABNORMAL HIGH (ref 70–99)
Potassium: 4 mEq/L (ref 3.5–5.1)
Sodium: 138 mEq/L (ref 135–145)

## 2022-04-10 LAB — HEMOGLOBIN A1C: Hgb A1c MFr Bld: 6.5 % (ref 4.6–6.5)

## 2022-04-11 LAB — FRUCTOSAMINE: Fructosamine: 273 umol/L (ref 0–285)

## 2022-04-12 ENCOUNTER — Encounter: Payer: Self-pay | Admitting: Endocrinology

## 2022-04-12 ENCOUNTER — Ambulatory Visit: Payer: BC Managed Care – PPO | Admitting: Endocrinology

## 2022-04-12 VITALS — BP 132/70 | HR 79 | Ht 73.0 in | Wt 317.8 lb

## 2022-04-12 DIAGNOSIS — E1165 Type 2 diabetes mellitus with hyperglycemia: Secondary | ICD-10-CM | POA: Diagnosis not present

## 2022-04-12 DIAGNOSIS — Z23 Encounter for immunization: Secondary | ICD-10-CM | POA: Diagnosis not present

## 2022-04-12 DIAGNOSIS — K7581 Nonalcoholic steatohepatitis (NASH): Secondary | ICD-10-CM

## 2022-04-12 DIAGNOSIS — K7460 Unspecified cirrhosis of liver: Secondary | ICD-10-CM | POA: Diagnosis not present

## 2022-04-12 NOTE — Patient Instructions (Addendum)
Take 2 Repaglanide before breakfast   Pioglitazone for liver?

## 2022-04-12 NOTE — Progress Notes (Signed)
Patient ID: Darryl Hunt, male   DOB: 06-Apr-1961, 61 y.o.   MRN: [id number]    Reason for Appointment: follow-up   History of Present Illness   Diagnosis: Type 2 DIABETES MELITUS, date of diagnosis: 2010        PREVIOUS history: He has been treated previously with regimen of metformin, glipizide and subsequently Victoza Overall has had fairly good control and glipizide was stopped because of tendency to hypoglycemia His A1c in the past has ranged from 6.7-7%, was 7% in 4/14 He was started on Invokana in 8/14; with this he started losing weight and blood sugars were consistently improved Because of recurrent balanitis he stopped this on his visit in 8/16  RECENT history:   He is on a regimen of   Prandin 2 mg bid and metformin 2 g  a day. Ozempic 1.0 mg weekly  A1c is 6.5 GMI on sensor for the last 2 weeks = 6.9  Current management, blood sugar patterns and problems identified: He has been taking Prandin and start of Amaryl since his last visit Although his weight is about the same his blood sugars appear to be generally better This may be partly related to his hip surgery recently and trying to walk more regularly Also likely watching his portions better He is generally taking his Prandin right before meals and occasionally a little Fasting readings are also better despite stopping Lab glucose was 105 fasting Previously his A1c appears to be falsely low and fructosamine is 273 now   Side effects from medications: balanitis from Invokana.  Vomiting from Victoza 1.8 mg  Data from freestyle libre 3 on the last 2 weeks:  Blood sugars are significantly better compared to last visit with improved time in range and less variability  OVERNIGHT blood sugars are mildly increased overall but variable and averaging between 132 and 142 with fasting readings averaging 134  POSTPRANDIAL readings are generally higher after breakfast and evening meal although averages about the  same at about 170-180 The rise blood sugar in the morning is more consistent and higher No hypoglycemia Premeal readings are the lowest before dinnertime at 126, not clear if he has more than 2 meals a day  Has been using the sensor consistently  CGM use % of time 96  2-week average/GV 148  Time in range      82  %  % Time Above 180 18  % Time above 250   % Time Below 70      PRE-MEAL Fasting Lunch Dinner Bedtime Overall  Glucose range:       Averages: 132  126     POST-MEAL PC Breakfast PC Lunch PC Dinner  Glucose range:     Averages: 180  171   Prior:  CGM use % of time 99  2-week average/GV 172  Time in range 63       %  % Time Above 180 34+3  % Time above 250   % Time Below 70      PRE-MEAL Fasting Lunch Dinner Bedtime Overall  Glucose range:       Averages: 171       POST-MEAL PC Breakfast PC Lunch PC Dinner  Glucose range:     Averages: 196  188 182       Wt Readings from Last 3 Encounters:  04/12/22 (!) 317 lb 12.8 oz (144.2 kg)  02/16/22 (!) 313 lb 0.9 oz (142 kg)  01/05/22 (!) 315 lb (  142.9 kg)   Lab Results  Component Value Date   HGBA1C 6.5 04/10/2022   HGBA1C 6.4 01/02/2022   HGBA1C 8.0 (H) 07/13/2021   Lab Results  Component Value Date   MICROALBUR 5.7 (H) 09/15/2021   LDLCALC 54 01/02/2022   CREATININE 0.68 04/10/2022   Lab Results  Component Value Date   HGB 12.3 (L) 11/26/2021     OTHER active problems: See review of systems   Lab on 04/10/2022  Component Date Value Ref Range Status   Fructosamine 04/10/2022 273  0 - 285 umol/L Final   Comment: Published reference interval for apparently healthy subjects between age 37 and 3 is 78 - 285 umol/L and in a poorly controlled diabetic population is 228 - 563 umol/L with a mean of 396 umol/L.    Sodium 04/10/2022 138  135 - 145 mEq/L Final   Potassium 04/10/2022 4.0  3.5 - 5.1 mEq/L Final   Chloride 04/10/2022 105  96 - 112 mEq/L Final   CO2 04/10/2022 27  19 - 32 mEq/L Final    Glucose, Bld 04/10/2022 105 (H)  70 - 99 mg/dL Final   BUN 75/04/2584 8  6 - 23 mg/dL Final   Creatinine, Ser 04/10/2022 0.68  0.40 - 1.50 mg/dL Final   GFR 27/78/2423 99.49  >60.00 mL/min Final   Calculated using the CKD-EPI Creatinine Equation (2021)   Calcium 04/10/2022 8.8  8.4 - 10.5 mg/dL Final   Hgb N3I MFr Bld 04/10/2022 6.5  4.6 - 6.5 % Final   Glycemic Control Guidelines for People with Diabetes:Non Diabetic:  <6%Goal of Therapy: <7%Additional Action Suggested:  >8%     Allergies as of 04/12/2022       Reactions   Latex Other (See Comments)   Pt states he is not allergic to latex, and does not want it because his wife is allergic and If he gets it on him she breaks out.        Medication List        Accurate as of April 12, 2022 11:59 PM. If you have any questions, ask your nurse or doctor.          albuterol 108 (90 Base) MCG/ACT inhaler Commonly known as: VENTOLIN HFA Inhale 2 puffs into the lungs every 4 (four) hours as needed.   Fish Oil Ultra 1400 MG Caps Take 2,800 mg by mouth 2 (two) times daily. Take 2 capsules by mouth twice daily.   FreeStyle Libre 2 Reader [employer] 1 each by Other route every 14 (fourteen) days.   FreeStyle Libre 3 Sensor Misc APPLY 1 SENSOR ON UPPER ARM EVERY 14 DAYS FOR CONTINUOUS GLUCOSE MONITORING   Insulin Pen Needle 32G X 4 MM Misc Use 1 needle daily.   levocetirizine 5 MG tablet Commonly known as: XYZAL Take 5 mg by mouth every evening.   meclizine 25 MG tablet Commonly known as: ANTIVERT Take 25 mg by mouth daily.   metFORMIN 500 MG 24 hr tablet Commonly known as: GLUCOPHAGE-XR TAKE 4 TABLETS BY MOUTH  DAILY WITH SUPPER   mometasone 50 MCG/ACT nasal spray Commonly known as: NASONEX Place 2 sprays into the nose daily as needed (allergies).   omeprazole 20 MG capsule Commonly known as: PRILOSEC Take 20 mg by mouth daily.   ondansetron 4 MG disintegrating tablet Commonly known as: ZOFRAN-ODT Take 4 mg by  mouth every 8 (eight) hours as needed for nausea or vomiting.   Ozempic (1 MG/DOSE) 4 MG/3ML Sopn Generic drug: Semaglutide (  1 MG/DOSE) INJECT 1 MG INTO THE SKIN ONE TIME PER WEEK   propranolol 20 MG tablet Commonly known as: INDERAL Take 1 tablet (20 mg total) by mouth 2 (two) times daily. Hold if SBP < 110 mmhg or if HR < 60 bpm What changed: when to take this   repaglinide 2 MG tablet Commonly known as: PRANDIN TAKE 1 TABLET (2 MG TOTAL) BY MOUTH 3 (THREE) TIMES DAILY BEFORE MEALS.   rosuvastatin 5 MG tablet Commonly known as: CRESTOR Take 1 tablet (5 mg total) by mouth daily.   tadalafil 5 MG tablet Commonly known as: CIALIS Take 5 mg by mouth daily.   tamsulosin 0.4 MG Caps capsule Commonly known as: FLOMAX Take 1 capsule (0.4 mg total) by mouth daily after supper.   traMADol 50 MG tablet Commonly known as: Ultram 1-2 tabs PO q6 hours prn pain   Vitamin D 125 MCG (5000 UT) Caps Take 5,000 Units by mouth 3 (three) times daily.        Allergies:  Allergies  Allergen Reactions   Latex Other (See Comments)    Pt states he is not allergic to latex, and does not want it because his wife is allergic and If he gets it on him she breaks out.    Past Medical History:  Diagnosis Date   Arthritis    Back   Diabetes mellitus without complication (HCC)    Type II   Fatty liver    GERD (gastroesophageal reflux disease)    History of kidney stones    Hyperlipidemia    Hypertension    OSA on CPAP    Pinched nerve in neck    Seasonal allergies    Varicose vein of leg    bilateral   Wears glasses    Wears hearing aid in both ears     Past Surgical History:  Procedure Laterality Date   CHOLECYSTECTOMY     COLONOSCOPY     CYSTOSCOPY WITH RETROGRADE PYELOGRAM, URETEROSCOPY AND STENT PLACEMENT Left 03/19/2019   Procedure: CYSTOSCOPY WITH LEFT  RETROGRADE URETEROSCOPY WITH HOLMIUM LASER ANDPOSSIBLE STENT STENT PLACEMENT;  Surgeon: Bjorn Pippin, MD;  Location: [facility]  [place];  Service: Urology;  Laterality: Left;   FRACTURE SURGERY Right    Arm childhood x2   KNEE ARTHROSCOPY Left    NASAL SINUS SURGERY     SHOULDER ARTHROSCOPY Right    TONSILLECTOMY AND ADENOIDECTOMY     Childhood   TRIGGER FINGER RELEASE Left    TRIGGER FINGER RELEASE Right 02/16/2022   Procedure: RELEASE TRIGGER FINGER/A-1 PULLEY RIGHT THUMB;  Surgeon: Betha Loa, MD;  Location: [facility];  Service: Orthopedics;  Laterality: Right;  30 MIN    Family History  Problem Relation Age of Onset   Cancer Mother    Hypertension Mother    Cancer Father    Hypertension Father    Hypertension Brother    Diabetes Brother    Diabetes Maternal Grandmother     Social History:  reports that he has never smoked. He has never used smokeless tobacco. He reports that he does not drink alcohol and does not use drugs.  Review of Systems:   HYPERTENSION: Blood pressure has been close to normal, because of low blood pressure his lisinopril was stopped and he is only on propanolol which is primarily for his varices  BP Readings from Last 3 Encounters:  04/12/22 132/70  02/16/22 (!) 158/95  01/05/22 120/64    HYPERLIPIDEMIA:  Has consistent control with Crestor 5 mg as follows   Lab Results  Component Value Date   CHOL 109 01/02/2022   HDL 39.50 01/02/2022   LDLCALC 54 01/02/2022   LDLDIRECT 91.1 05/27/2014   TRIG 78.0 01/02/2022   CHOLHDL 3 01/02/2022     He has cirrhosis of the liver with varices, followed by gastroenterologist and is being evaluated for possible transplant Also has low platelets chronically  Lab Results  Component Value Date   ALT 26 11/25/2021    Diabetic foot exam in 10/22    Examination:   BP 132/70   Pulse 79   Ht 6\' 1"  (1.854 m)   Wt (!) 317 lb 12.8 oz (144.2 kg)   SpO2 95%   BMI 41.93 kg/m   Body mass index is 41.93 kg/m.      ASSESSMENT/ PLAN:  Diabetes type 2 with morbid obesity  See history of  present illness for detailed discussion of current diabetes management, blood sugar patterns and problems identified   A1c is 6.4 Fructosamine is 273  His GMI on his sensor recently is 6.9 compared to previous 7.4   He is on Ozempic 1 mg weekly, Metformin and Amaryl 2 mg twice daily His control is improved and not clear why but may be a combination of watching his diet better, being more active and recent hip surgery Postprandial readings are generally better with Prandin but not consistently after breakfast this getting a balanced meal with only small amount of carbohydrate   History of cirrhosis: He will need to discuss with his hepatologist if he is a candidate for pioglitazone at this stage  Plan: He can take 2 tablets of repaglinide before breakfast unless cutting back on carbohydrates Stay on Ozempic and other medications unchanged Continue to increase walking   Patient Instructions  Take 2 Repaglanide before breakfast   Pioglitazone for liver?   Flu vaccine given  Elayne Snare 04/13/2022, 8:23 AM   Note: This office note was prepared with Dragon voice recognition system technology. Any transcriptional errors that result from this process are unintentional.

## 2022-04-13 ENCOUNTER — Encounter: Payer: Self-pay | Admitting: Endocrinology

## 2022-04-16 MED ORDER — FREESTYLE LIBRE 3 SENSOR MISC: 2 refills | Status: DC

## 2022-04-16 NOTE — Addendum Note (Signed)
Addended by: Cinda Quest on: 04/16/2022 08:33 AM   Modules accepted: Orders

## 2022-04-19 ENCOUNTER — Telehealth: Payer: Self-pay | Admitting: Endocrinology

## 2022-04-19 NOTE — Telephone Encounter (Signed)
error 

## 2022-04-23 ENCOUNTER — Other Ambulatory Visit: Payer: Self-pay | Admitting: Endocrinology

## 2022-06-22 ENCOUNTER — Other Ambulatory Visit: Payer: Self-pay

## 2022-06-22 ENCOUNTER — Emergency Department (HOSPITAL_COMMUNITY): Payer: BC Managed Care – PPO

## 2022-06-22 ENCOUNTER — Emergency Department (HOSPITAL_COMMUNITY)
Admission: EM | Admit: 2022-06-22 | Discharge: 2022-06-22 | Disposition: A | Discharge status: Home / Self Care | Payer: BC Managed Care – PPO | Attending: Emergency Medicine | Admitting: Emergency Medicine

## 2022-06-22 ENCOUNTER — Encounter (HOSPITAL_COMMUNITY): Payer: Self-pay | Admitting: *Deleted

## 2022-06-22 DIAGNOSIS — Z79899 Other long term (current) drug therapy: Secondary | ICD-10-CM | POA: Insufficient documentation

## 2022-06-22 DIAGNOSIS — R42 Dizziness and giddiness: Secondary | ICD-10-CM | POA: Insufficient documentation

## 2022-06-22 DIAGNOSIS — Z7984 Long term (current) use of oral hypoglycemic drugs: Secondary | ICD-10-CM | POA: Diagnosis not present

## 2022-06-22 DIAGNOSIS — E119 Type 2 diabetes mellitus without complications: Secondary | ICD-10-CM | POA: Insufficient documentation

## 2022-06-22 DIAGNOSIS — Z794 Long term (current) use of insulin: Secondary | ICD-10-CM | POA: Diagnosis not present

## 2022-06-22 DIAGNOSIS — Z91040 Latex allergy status: Secondary | ICD-10-CM | POA: Diagnosis not present

## 2022-06-22 DIAGNOSIS — I10 Essential (primary) hypertension: Secondary | ICD-10-CM | POA: Diagnosis not present

## 2022-06-22 HISTORY — DX: Dizziness and giddiness: R42

## 2022-06-22 HISTORY — DX: Anemia, unspecified: D64.9

## 2022-06-22 LAB — URINALYSIS, ROUTINE W REFLEX MICROSCOPIC
Bacteria, UA: NONE SEEN
Bilirubin Urine: NEGATIVE
Glucose, UA: >=500 mg/dL — AB
Hgb urine dipstick: NEGATIVE
Ketones, ur: NEGATIVE mg/dL
Leukocytes,Ua: NEGATIVE
Nitrite: NEGATIVE
Protein, ur: NEGATIVE mg/dL
Specific Gravity, Urine: 1.022 (ref 1.005–1.030)
pH: 6 (ref 5.0–8.0)

## 2022-06-22 LAB — BASIC METABOLIC PANEL
Anion gap: 6 (ref 5–15)
BUN: 7 mg/dL — ABNORMAL LOW (ref 8–23)
CO2: 24 mmol/L (ref 22–32)
Calcium: 8.5 mg/dL — ABNORMAL LOW (ref 8.9–10.3)
Chloride: 107 mmol/L (ref 98–111)
Creatinine, Ser: 0.61 mg/dL (ref 0.61–1.24)
GFR, Estimated: >60 mL/min (ref >60)
Glucose, Bld: 156 mg/dL — ABNORMAL HIGH (ref 70–99)
Potassium: 3.7 mmol/L (ref 3.5–5.1)
Sodium: 137 mmol/L (ref 135–145)

## 2022-06-22 LAB — CBC
HCT: 33.8 % — ABNORMAL LOW (ref 39.0–52.0)
Hemoglobin: 11.1 g/dL — ABNORMAL LOW (ref 13.0–17.0)
MCH: 29.9 pg (ref 26.0–34.0)
MCHC: 32.8 g/dL (ref 30.0–36.0)
MCV: 91.1 fL (ref 80.0–100.0)
Platelets: 84 10*3/uL — ABNORMAL LOW (ref 150–400)
RBC: 3.71 MIL/uL — ABNORMAL LOW (ref 4.22–5.81)
RDW: 15.7 % — ABNORMAL HIGH (ref 11.5–15.5)
WBC: 3 10*3/uL — ABNORMAL LOW (ref 4.0–10.5)
nRBC: 0 % (ref 0.0–0.2)

## 2022-06-22 LAB — CBG MONITORING, ED: Glucose-Capillary: 149 mg/dL — ABNORMAL HIGH (ref 70–99)

## 2022-06-22 MED ORDER — MECLIZINE HCL 12.5 MG PO TABS
25.0000 mg | ORAL_TABLET | Freq: Once | ORAL | Status: AC
  Administered 2022-06-22: 25 mg via ORAL
  Filled 2022-06-22: qty 2

## 2022-06-22 NOTE — ED Triage Notes (Signed)
States hx of vertigo.  States his balance is off for past 24 hours.
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
## 2022-06-22 NOTE — Discharge Instructions (Signed)
Evaluation of your dizziness was overall reassuring.  CT scans did not reveal acute process that could be attributing to your symptoms.  Also reassuring that your symptoms improved with meclizine.  Likely source of your vertigo is peripheral.  Advised that he continue taking meclizine for the next 3 to 4 days and follow-up with your PCP as soon as possible.  If you have new facial droop, slurred speech, altered mental status, changes in your gait, new chest pain or shortness of breath please return to the emergency department for further evaluation.
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
## 2022-07-13 ENCOUNTER — Encounter: Payer: Self-pay | Admitting: Endocrinology

## 2022-07-13 DIAGNOSIS — E1165 Type 2 diabetes mellitus with hyperglycemia: Secondary | ICD-10-CM

## 2022-07-13 MED ORDER — FREESTYLE LIBRE 3 SENSOR MISC: 2 refills | Status: AC

## 2022-07-16 IMAGING — CT CT HEAD W/O CM
3 of 4 series · 15 of 47 positions shown, 18 images · non-contrast
Comparison: 04/09/2021 CT

CLINICAL DATA: 61-year-old male with dizziness and weakness.



[Series 2: head w o · axial · 0.46mm/px · z∈[+11,+161]mm · 9 of 36 slices shown, 12 images]
[im 3/36  brain]
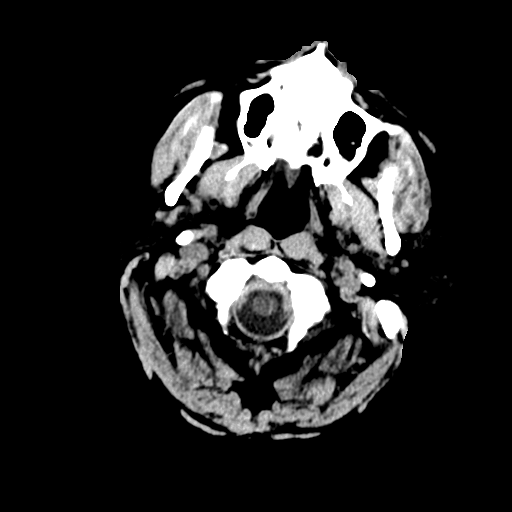
[im 3/36  bone]
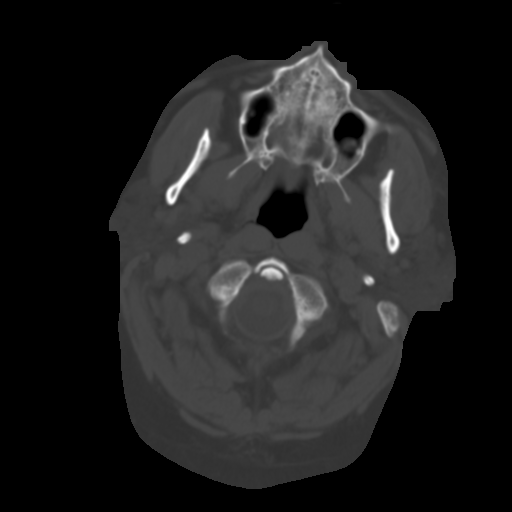
[im 8/36  brain]
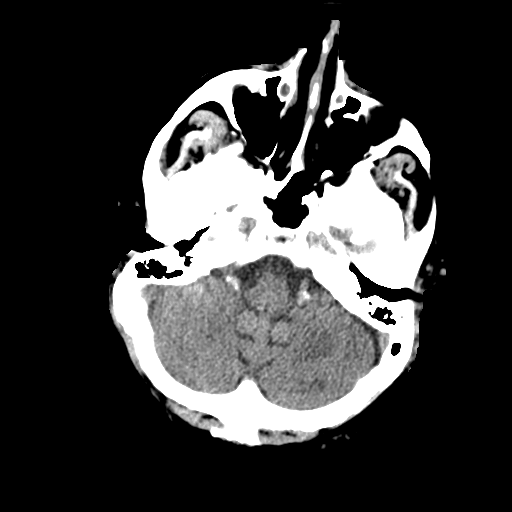
[im 11/36  brain]
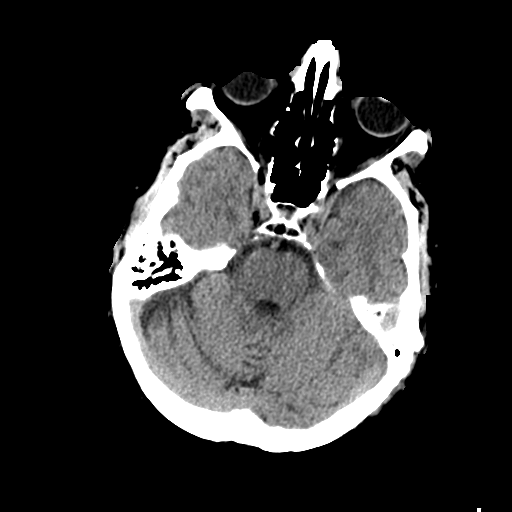
[im 16/36  brain]
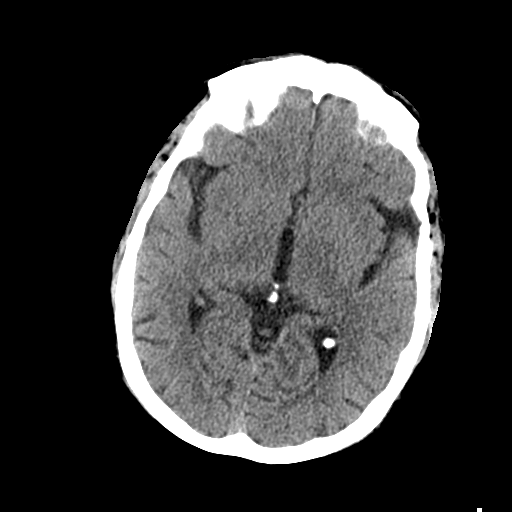
[im 18/36  brain]
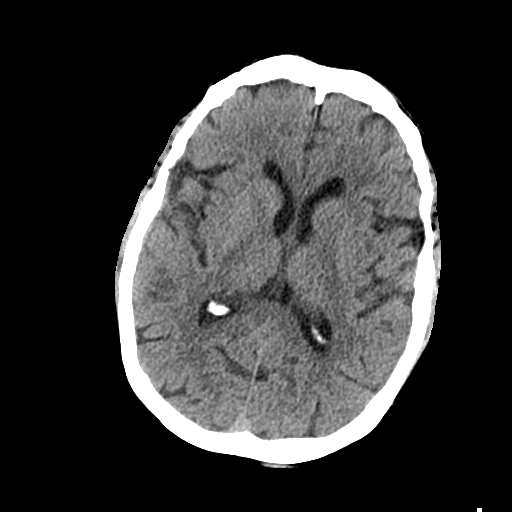
[im 18/36  bone]
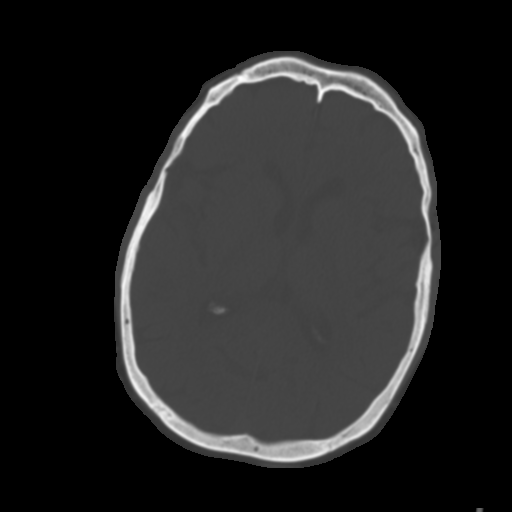
[im 21/36  brain]
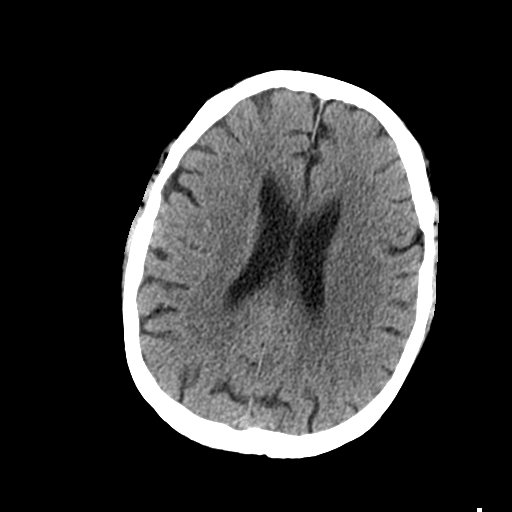
[im 26/36  brain]
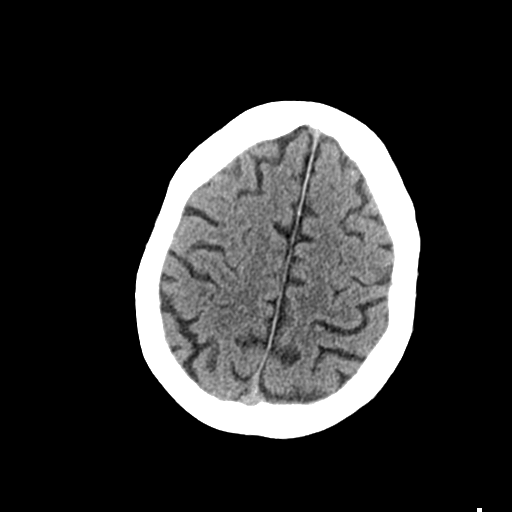
[im 28/36  brain]
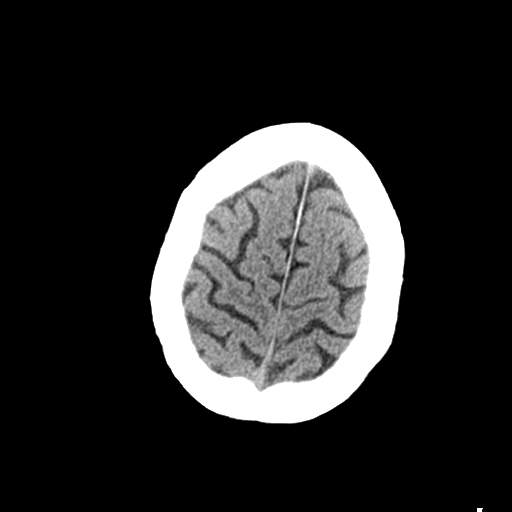
[im 33/36  brain]
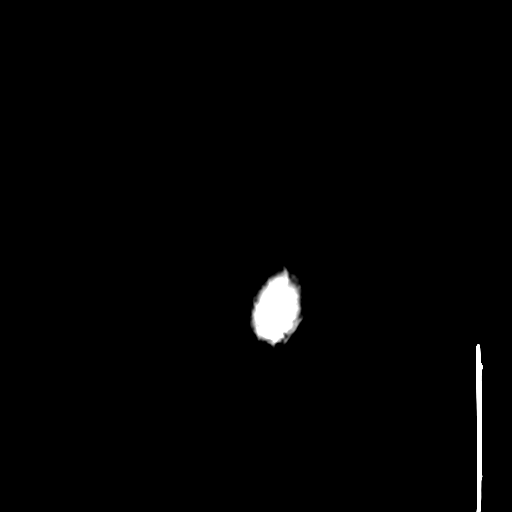
[im 33/36  bone]
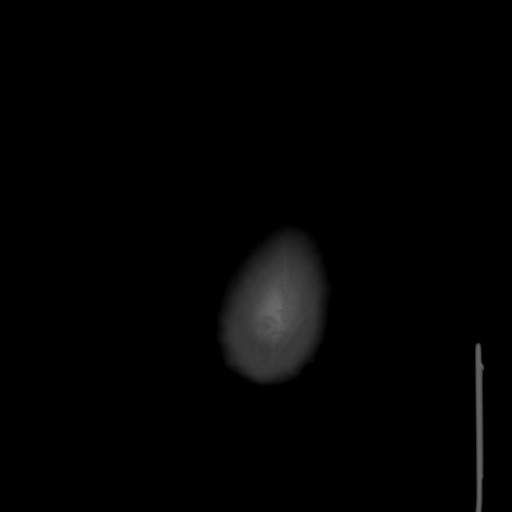

[Series 4: coronal soft · coronal · 0.35mm/px · 3 of 82 slices shown]
[im 28/82  brain]
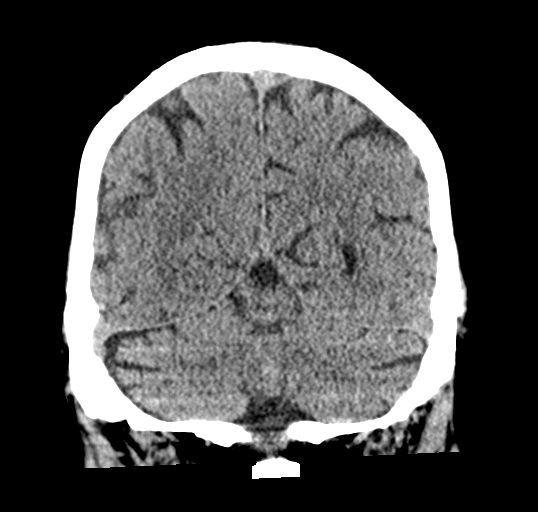
[im 37/82  brain]
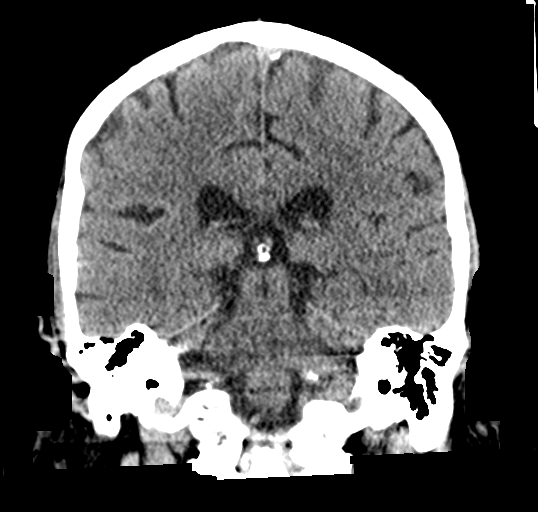
[im 46/82  brain]
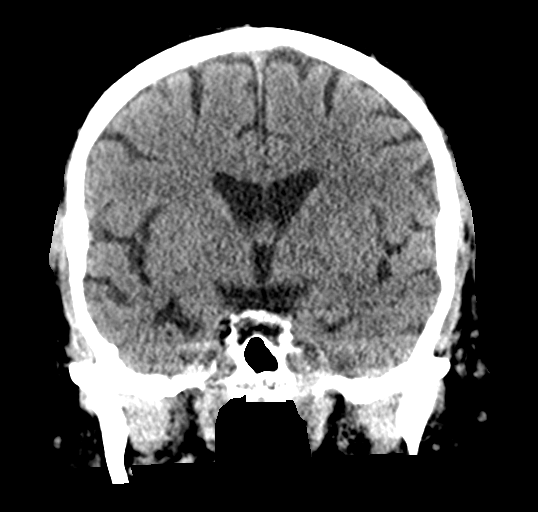

[Series 5: sagittal soft · sagittal · 0.35mm/px · 3 of 61 slices shown]
[im 21/61  brain]
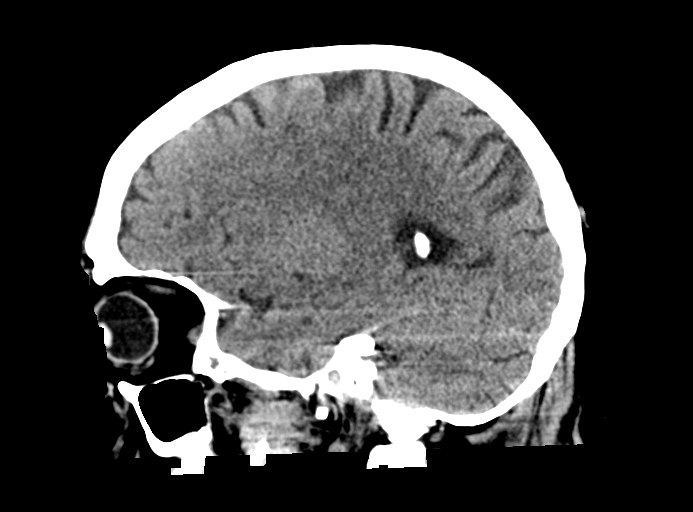
[im 31/61  brain]
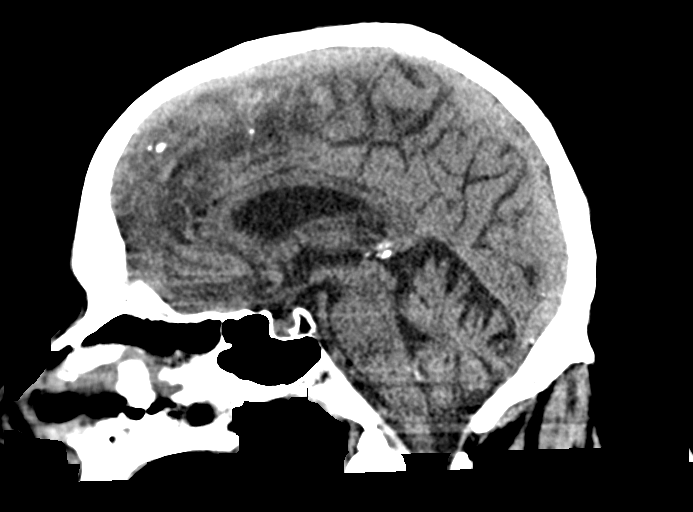
[im 41/61  brain]
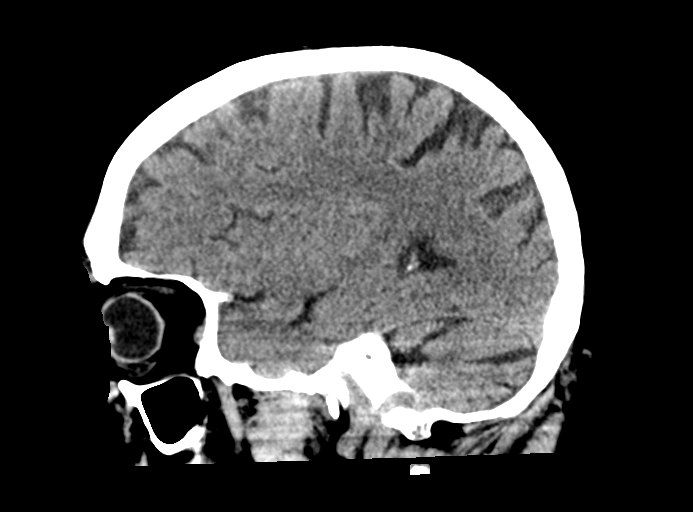

[15 of 47 positions shown; findings below may reference images not displayed]

FINDINGS: Brain: No evidence of acute infarction, hemorrhage, hydrocephalus,
extra-axial collection or mass lesion/mass effect. Mild
periventricular white matter hypodensities likely represent chronic
small-vessel white matter ischemic changes.

Vascular: No hyperdense vessel or unexpected calcification.

Skull: Normal. Negative for fracture or focal lesion.

Sinuses/Orbits: No acute finding.

Other: None.
IMPRESSION: 1. No evidence of acute intracranial abnormality.
2. Mild chronic small-vessel white matter ischemic changes.

## 2022-07-21 ENCOUNTER — Other Ambulatory Visit: Payer: Self-pay | Admitting: Endocrinology

## 2022-07-26 ENCOUNTER — Encounter: Payer: Self-pay | Admitting: Endocrinology

## 2022-07-26 ENCOUNTER — Other Ambulatory Visit: Payer: Self-pay | Admitting: Endocrinology

## 2022-08-13 ENCOUNTER — Other Ambulatory Visit (INDEPENDENT_AMBULATORY_CARE_PROVIDER_SITE_OTHER): Payer: BC Managed Care – PPO

## 2022-08-13 DIAGNOSIS — E1165 Type 2 diabetes mellitus with hyperglycemia: Secondary | ICD-10-CM

## 2022-08-13 LAB — BASIC METABOLIC PANEL
BUN: 7 mg/dL (ref 6–23)
CO2: 27 mEq/L (ref 19–32)
Calcium: 8.7 mg/dL (ref 8.4–10.5)
Chloride: 106 mEq/L (ref 96–112)
Creatinine, Ser: 0.73 mg/dL (ref 0.40–1.50)
GFR: 97.15 mL/min (ref >60.00)
Glucose, Bld: 134 mg/dL — ABNORMAL HIGH (ref 70–99)
Potassium: 3.8 mEq/L (ref 3.5–5.1)
Sodium: 140 mEq/L (ref 135–145)

## 2022-08-13 LAB — HEMOGLOBIN A1C: Hgb A1c MFr Bld: 6.5 % (ref 4.6–6.5)

## 2022-08-15 ENCOUNTER — Ambulatory Visit: Payer: BC Managed Care – PPO | Admitting: Endocrinology

## 2022-08-15 ENCOUNTER — Encounter: Payer: Self-pay | Admitting: Endocrinology

## 2022-08-15 VITALS — BP 136/70 | HR 78 | Ht 73.0 in | Wt 317.4 lb

## 2022-08-15 DIAGNOSIS — I10 Essential (primary) hypertension: Secondary | ICD-10-CM | POA: Diagnosis not present

## 2022-08-15 DIAGNOSIS — E782 Mixed hyperlipidemia: Secondary | ICD-10-CM

## 2022-08-15 DIAGNOSIS — E1165 Type 2 diabetes mellitus with hyperglycemia: Secondary | ICD-10-CM

## 2022-08-15 LAB — MICROALBUMIN / CREATININE URINE RATIO
Creatinine,U: 119 mg/dL
Microalb Creat Ratio: 1.2 mg/g (ref 0.0–30.0)
Microalb, Ur: 1.4 mg/dL (ref 0.0–1.9)

## 2022-08-15 MED ORDER — OZEMPIC (2 MG/DOSE) 8 MG/3ML ~~LOC~~ SOPN: PEN_INJECTOR | SUBCUTANEOUS | 1 refills | Status: AC

## 2022-08-15 NOTE — Progress Notes (Signed)
Patient ID: Darryl Hunt, male   DOB: 04-May-1961, 62 y.o.   MRN: [id number]    Reason for Appointment: follow-up   History of Present Illness   Diagnosis: Type 2 DIABETES MELITUS, date of diagnosis: 2010        PREVIOUS history: He has been treated previously with regimen of metformin, glipizide and subsequently Victoza Overall has had fairly good control and glipizide was stopped because of tendency to hypoglycemia His A1c in the past has ranged from 6.7-7%, was 7% in 4/14 He was started on Invokana in 8/14; with this he started losing weight and blood sugars were consistently improved Because of recurrent balanitis he stopped this on his visit in 8/16  RECENT history:   He is on a regimen of   Prandin 2 mg bid and metformin 2 g  a day. Ozempic 1.0 mg weekly  A1c is 6.5 GMI on sensor for the last 2 weeks = 6.9  Current management, blood sugar patterns and problems identified: He has been taking Prandin at lunch and dinner Although he may have a biscuit in the morning this does not usually raise his blood sugar but otherwise blood sugars will go up if he has more carbohydrates like pancakes He is still periodically eating higher carbohydrate meals causing higher readings after lunch and dinner Still unable to lose weight He is generally taking his Prandin right before meals and usually remembering to take this Lab glucose was 105 fasting Not doing any exercise 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  Vomiting from Victoza 1.8 mg  Data from freestyle libre 3 on the last 2 weeks:  Blood sugars are less frequently in range compared to last visit when he was 82% within target Most of the high reading is are postprandial OVERNIGHT blood sugars are generally averaging in the 140s without hypoglycemia or much variability FASTING averages about 144 POSTPRANDIAL readings are periodically higher  after breakfast and also more often after lunch and sporadically after evening meal without any consistent pattern Absolute rise in blood sugar after meals is not significant with average 187 after lunch and 190 after dinner Premeal blood sugars are mildly increased at lunch and dinner averaging 160+  No hypoglycemia  CGM use % of time 96  2-week average/GV 166  Time in range        68%  % Time Above 180 31+1  % Time above 250   % Time Below 70      PRE-MEAL Fasting Lunch Dinner Bedtime Overall  Glucose range:       Averages: 141    166   POST-MEAL PC Breakfast PC Lunch PC Dinner  Glucose range:     Averages:  187 190   Previously:  CGM use % of time 96  2-week average/GV 148  Time in range      82  %  % Time Above 180 18  % Time above 250   % Time Below 70      PRE-MEAL Fasting Lunch Dinner Bedtime Overall  Glucose range:       Averages: 132  126     POST-MEAL PC Breakfast PC Lunch PC Dinner  Glucose range:     Averages: 180  171        Wt Readings from Last 3 Encounters:  08/15/22 (!) 317 lb 6.4 oz (144 kg)  06/22/22 (!) 310 lb (140.6 kg)  04/12/22 (!) 317 lb 12.8 oz (144.2 kg)   Lab Results  Component Value Date   HGBA1C 6.5 08/13/2022   HGBA1C 6.5 04/10/2022   HGBA1C 6.4 01/02/2022   Lab Results  Component Value Date   MICROALBUR 5.7 (H) 09/15/2021   LDLCALC 54 01/02/2022   CREATININE 0.73 08/13/2022   Lab Results  Component Value Date   HGB 11.1 (L) 06/22/2022     OTHER active problems: See review of systems   Lab on 08/13/2022  Component Date Value Ref Range Status   Sodium 08/13/2022 140  135 - 145 mEq/L Final   Potassium 08/13/2022 3.8  3.5 - 5.1 mEq/L Final   Chloride 08/13/2022 106  96 - 112 mEq/L Final   CO2 08/13/2022 27  19 - 32 mEq/L Final   Glucose, Bld 08/13/2022 134 (H)  70 - 99 mg/dL Final   BUN 08/13/2022 7  6 - 23 mg/dL Final   Creatinine, Ser 08/13/2022 0.73  0.40 - 1.50 mg/dL Final   GFR 08/13/2022 97.15  >60.00 mL/min  Final   Calculated using the CKD-EPI Creatinine Equation (2021)   Calcium 08/13/2022 8.7  8.4 - 10.5 mg/dL Final   Hgb A1c MFr Bld 08/13/2022 6.5  4.6 - 6.5 % Final   Glycemic Control Guidelines for People with Diabetes:Non Diabetic:  <6%Goal of Therapy: <7%Additional Action Suggested:  >8%     Allergies as of 08/15/2022       Reactions   Latex Other (See Comments)   Pt states he is not allergic to latex, and does not want it because his wife is allergic and If he gets it on him she breaks out.        Medication List        Accurate as of August 15, 2022 10:16 AM. If you have any questions, ask your nurse or doctor.          STOP taking these medications    albuterol 108 (90 Base) MCG/ACT inhaler Commonly known as: VENTOLIN HFA Stopped by: Elayne Snare, MD   Fish Oil Ultra 1400 MG Caps Stopped by: Elayne Snare, MD   FreeStyle Libre 2 Reader Margretta Sidle by: Elayne Snare, MD   Insulin Pen Needle 32G X 4 MM Misc Stopped by: Elayne Snare, MD   mometasone 50 MCG/ACT nasal spray Commonly known as: NASONEX Stopped by: Elayne Snare, MD   omeprazole 20 MG capsule Commonly known as: PRILOSEC Stopped by: Elayne Snare, MD   traMADol 50 MG tablet Commonly known as: Ultram Stopped by: Elayne Snare, MD   Vitamin D 125 MCG (5000 UT) Caps Stopped by: Elayne Snare, MD       TAKE these medications    FreeStyle Libre 3 Sensor Misc APPLY 1 SENSOR ON UPPER ARM EVERY 14 DAYS FOR CONTINUOUS GLUCOSE MONITORING   levocetirizine 5 MG tablet Commonly known as: XYZAL Take 5 mg by mouth every evening.   meclizine 25 MG tablet Commonly known as: ANTIVERT Take 25 mg by mouth daily.   metFORMIN 500 MG 24 hr tablet Commonly known as: GLUCOPHAGE-XR TAKE 4 TABLETS BY MOUTH  DAILY WITH SUPPER   ondansetron 4 MG disintegrating tablet Commonly known as: ZOFRAN-ODT Take 4 mg by mouth every 8 (eight) hours as needed for nausea or vomiting.   Ozempic (1 MG/DOSE) 4 MG/3ML Sopn Generic drug:  Semaglutide (1 MG/DOSE) INJECT 1 MG INTO THE SKIN ONE TIME PER WEEK   propranolol 20 MG tablet Commonly known as: INDERAL Take 1 tablet (20  mg total) by mouth 2 (two) times daily. Hold if SBP < 110 mmhg or if HR < 60 bpm What changed: when to take this   repaglinide 2 MG tablet Commonly known as: PRANDIN TAKE 1 TABLET (2 MG TOTAL) BY MOUTH 3 (THREE) TIMES DAILY BEFORE MEALS.   rosuvastatin 5 MG tablet Commonly known as: CRESTOR Take 1 tablet (5 mg total) by mouth daily.   tadalafil 5 MG tablet Commonly known as: CIALIS Take 5 mg by mouth daily.   tamsulosin 0.4 MG Caps capsule Commonly known as: FLOMAX Take 1 capsule (0.4 mg total) by mouth daily after supper.        Allergies:  Allergies  Allergen Reactions   Latex Other (See Comments)    Pt states he is not allergic to latex, and does not want it because his wife is allergic and If he gets it on him she breaks out.    Past Medical History:  Diagnosis Date   Anemia    Arthritis    Back   Diabetes mellitus without complication (HCC)    Type II   Fatty liver    GERD (gastroesophageal reflux disease)    History of kidney stones    Hyperlipidemia    Hypertension    OSA on CPAP    Pinched nerve in neck    Seasonal allergies    Varicose vein of leg    bilateral   Vertigo    Wears glasses    Wears hearing aid in both ears     Past Surgical History:  Procedure Laterality Date   CHOLECYSTECTOMY     COLONOSCOPY     CYSTOSCOPY WITH RETROGRADE PYELOGRAM, URETEROSCOPY AND STENT PLACEMENT Left 03/19/2019   Procedure: CYSTOSCOPY WITH LEFT  RETROGRADE URETEROSCOPY WITH HOLMIUM LASER ANDPOSSIBLE STENT STENT PLACEMENT;  Surgeon: [name], MD;  Location: [facility];  Service: Urology;  Laterality: Left;   FRACTURE SURGERY Right    Arm childhood x2   KNEE ARTHROSCOPY Left    NASAL SINUS SURGERY     SHOULDER ARTHROSCOPY Right    TONSILLECTOMY AND ADENOIDECTOMY     Childhood   TRIGGER FINGER RELEASE  Left    TRIGGER FINGER RELEASE Right 02/16/2022   Procedure: RELEASE TRIGGER FINGER/A-1 PULLEY RIGHT THUMB;  Surgeon: Leanora Cover, MD;  Location: [facility];  Service: Orthopedics;  Laterality: Right;  53 MIN    Family History  Problem Relation Age of Onset   Cancer Mother    Hypertension Mother    Cancer Father    Hypertension Father    Hypertension Brother    Diabetes Brother    Diabetes Maternal Grandmother     Social History:  reports that he has never smoked. He has never used smokeless tobacco. He reports that he does not drink alcohol and does not use drugs.  Review of Systems:   Currently not on hypertension treatment  BP Readings from Last 3 Encounters:  08/15/22 136/70  06/22/22 (!) 125/59  04/12/22 132/70    HYPERLIPIDEMIA:   Has consistent control with Crestor 5 mg as follows   Lab Results  Component Value Date   CHOL 109 01/02/2022   HDL 39.50 01/02/2022   LDLCALC 54 01/02/2022   LDLDIRECT 91.1 05/27/2014   TRIG 78.0 01/02/2022   CHOLHDL 3 01/02/2022     He has cirrhosis of the liver with varices, followed by gastroenterologist and is being evaluated for possible transplant Also has low platelets chronically to be seen  by hematologist    Lab Results  Component Value Date   ALT 26 11/25/2021    Diabetic foot exam in 10/22    Examination:   BP 136/70 (BP Location: Left Arm, Patient Position: Sitting, Cuff Size: Normal)   Pulse 78   Ht 6\' 1"  (1.854 m)   Wt (!) 317 lb 6.4 oz (144 kg)   SpO2 96%   BMI 41.88 kg/m   Body mass index is 41.88 kg/m.      ASSESSMENT/ PLAN:  Diabetes type 2 with morbid obesity  See history of present illness for detailed discussion of current diabetes management, blood sugar patterns and problems identified   A1c is 6.5   His GMI on his sensor is 7.3 and overall blood sugars are higher  Likely has not had consistent diet and exercise  He is on Ozempic 1 mg weekly, Metformin and Prandin  before lunch and dinner He still has some overnight hyperglycemia and periodic spikes in blood sugars at mealtimes as discussed above  Plan :  He can take 2 tablets of repaglinide before any meals that have more carbohydrate Also if he is going to be eating pancakes or other high carbohydrate meal at breakfast he will need to take Prandin at that time also Needs to be generally cutting back on carbohydrates and portions Stay on Ozempic but increase the dose to 2 mg to provide overall better control and weight loss Other medications will be unchanged Restart exercise program when able to He [place]   There are no Patient Instructions on file for this visit.  08/15/2022, 10:16 AM   Note: This office note was prepared with Dragon voice recognition system technology. Any transcriptional errors that result from this process are unintentional.

## 2022-08-16 ENCOUNTER — Encounter: Payer: Self-pay | Admitting: Endocrinology

## 2022-09-26 ENCOUNTER — Other Ambulatory Visit: Payer: Self-pay | Admitting: Endocrinology
